# Patient Record
Sex: Female | Born: 1970 | Race: White | Hispanic: No | Marital: Married | Smoking: Former smoker
Health system: Southern US, Community
[De-identification: ages and names within clinical notes are randomized; demographics above are authoritative.]

## PROBLEM LIST (undated history)

## (undated) DIAGNOSIS — Z87442 Personal history of urinary calculi: Secondary | ICD-10-CM

## (undated) DIAGNOSIS — C4499 Other specified malignant neoplasm of skin, unspecified: Secondary | ICD-10-CM

## (undated) DIAGNOSIS — J189 Pneumonia, unspecified organism: Secondary | ICD-10-CM

## (undated) DIAGNOSIS — T8859XA Other complications of anesthesia, initial encounter: Secondary | ICD-10-CM

## (undated) DIAGNOSIS — I10 Essential (primary) hypertension: Secondary | ICD-10-CM

## (undated) DIAGNOSIS — F909 Attention-deficit hyperactivity disorder, unspecified type: Secondary | ICD-10-CM

## (undated) DIAGNOSIS — R569 Unspecified convulsions: Secondary | ICD-10-CM

## (undated) DIAGNOSIS — F32A Depression, unspecified: Secondary | ICD-10-CM

## (undated) DIAGNOSIS — T4145XA Adverse effect of unspecified anesthetic, initial encounter: Secondary | ICD-10-CM

## (undated) DIAGNOSIS — F329 Major depressive disorder, single episode, unspecified: Secondary | ICD-10-CM

## (undated) DIAGNOSIS — K8020 Calculus of gallbladder without cholecystitis without obstruction: Secondary | ICD-10-CM

## (undated) DIAGNOSIS — R011 Cardiac murmur, unspecified: Secondary | ICD-10-CM

## (undated) DIAGNOSIS — G43909 Migraine, unspecified, not intractable, without status migrainosus: Secondary | ICD-10-CM

## (undated) HISTORY — PX: WISDOM TOOTH EXTRACTION: SHX21

## (undated) HISTORY — PX: NECK SURGERY: SHX720

## (undated) HISTORY — DX: Migraine, unspecified, not intractable, without status migrainosus: G43.909

## (undated) HISTORY — PX: BACK SURGERY: SHX140

## (undated) HISTORY — DX: Unspecified convulsions: R56.9

## (undated) HISTORY — DX: Depression, unspecified: F32.A

## (undated) HISTORY — DX: Essential (primary) hypertension: I10

## (undated) HISTORY — DX: Calculus of gallbladder without cholecystitis without obstruction: K80.20

## (undated) HISTORY — DX: Major depressive disorder, single episode, unspecified: F32.9

## (undated) HISTORY — PX: OTHER SURGICAL HISTORY: SHX169

---

## 2000-07-19 HISTORY — PX: CHOLECYSTECTOMY: SHX55

## 2006-07-19 HISTORY — PX: CERVICAL ABLATION: SHX5771

## 2013-08-16 ENCOUNTER — Ambulatory Visit: Payer: Self-pay

## 2013-11-17 ENCOUNTER — Emergency Department: Payer: Self-pay | Admitting: Emergency Medicine

## 2013-11-17 LAB — CBC
HCT: 44 % (ref 35.0–47.0)
HGB: 15.1 g/dL (ref 12.0–16.0)
MCH: 29.3 pg (ref 26.0–34.0)
MCHC: 34.3 g/dL (ref 32.0–36.0)
MCV: 85 fL (ref 80–100)
Platelet: 189 10*3/uL (ref 150–440)
RBC: 5.15 10*6/uL (ref 3.80–5.20)
RDW: 13.6 % (ref 11.5–14.5)
WBC: 18.5 10*3/uL — ABNORMAL HIGH (ref 3.6–11.0)

## 2013-11-17 LAB — COMPREHENSIVE METABOLIC PANEL
Albumin: 4 g/dL (ref 3.4–5.0)
Alkaline Phosphatase: 99 U/L
Anion Gap: 11 (ref 7–16)
BUN: 10 mg/dL (ref 7–18)
Bilirubin,Total: 0.3 mg/dL (ref 0.2–1.0)
Calcium, Total: 9.3 mg/dL (ref 8.5–10.1)
Chloride: 107 mmol/L (ref 98–107)
Co2: 23 mmol/L (ref 21–32)
Creatinine: 0.54 mg/dL — ABNORMAL LOW (ref 0.60–1.30)
EGFR (African American): >60
Glucose: 107 mg/dL — ABNORMAL HIGH (ref 65–99)
OSMOLALITY: 281 (ref 275–301)
Potassium: 3.6 mmol/L (ref 3.5–5.1)
SGOT(AST): 25 U/L (ref 15–37)
SGPT (ALT): 24 U/L (ref 12–78)
SODIUM: 141 mmol/L (ref 136–145)
TOTAL PROTEIN: 7.9 g/dL (ref 6.4–8.2)

## 2013-11-17 LAB — TROPONIN I: Troponin-I: <0.02 ng/mL

## 2013-11-17 LAB — LIPASE, BLOOD: LIPASE: 150 U/L (ref 73–393)

## 2014-01-29 ENCOUNTER — Ambulatory Visit: Payer: Self-pay

## 2014-01-31 ENCOUNTER — Encounter: Payer: Self-pay | Admitting: Internal Medicine

## 2014-04-16 ENCOUNTER — Other Ambulatory Visit (INDEPENDENT_AMBULATORY_CARE_PROVIDER_SITE_OTHER): Payer: No Typology Code available for payment source

## 2014-04-16 ENCOUNTER — Other Ambulatory Visit: Payer: No Typology Code available for payment source

## 2014-04-16 ENCOUNTER — Ambulatory Visit (INDEPENDENT_AMBULATORY_CARE_PROVIDER_SITE_OTHER): Payer: No Typology Code available for payment source | Admitting: Internal Medicine

## 2014-04-16 ENCOUNTER — Encounter: Payer: Self-pay | Admitting: Internal Medicine

## 2014-04-16 VITALS — BP 126/80 | HR 72 | Ht 67.25 in | Wt 226.1 lb

## 2014-04-16 DIAGNOSIS — K5900 Constipation, unspecified: Secondary | ICD-10-CM

## 2014-04-16 LAB — CBC WITH DIFFERENTIAL/PLATELET
Basophils Absolute: 0.1 10*3/uL (ref 0.0–0.1)
Basophils Relative: 0.7 % (ref 0.0–3.0)
Eosinophils Absolute: 0.1 10*3/uL (ref 0.0–0.7)
Eosinophils Relative: 1.2 % (ref 0.0–5.0)
HCT: 42.5 % (ref 36.0–46.0)
Hemoglobin: 14.3 g/dL (ref 12.0–15.0)
Lymphocytes Relative: 25.8 % (ref 12.0–46.0)
Lymphs Abs: 3.2 10*3/uL (ref 0.7–4.0)
MCHC: 33.7 g/dL (ref 30.0–36.0)
MCV: 84.6 fl (ref 78.0–100.0)
Monocytes Absolute: 0.4 10*3/uL (ref 0.1–1.0)
Monocytes Relative: 3.3 % (ref 3.0–12.0)
Neutro Abs: 8.6 10*3/uL — ABNORMAL HIGH (ref 1.4–7.7)
Neutrophils Relative %: 69 % (ref 43.0–77.0)
Platelets: 237 10*3/uL (ref 150.0–400.0)
RBC: 5.02 Mil/uL (ref 3.87–5.11)
RDW: 13.6 % (ref 11.5–15.5)
WBC: 12.5 10*3/uL — ABNORMAL HIGH (ref 4.0–10.5)

## 2014-04-16 LAB — VITAMIN B12: Vitamin B-12: 390 pg/mL (ref 211–911)

## 2014-04-16 LAB — TSH: TSH: 1.06 u[IU]/mL (ref 0.35–4.50)

## 2014-04-16 MED ORDER — LINACLOTIDE 145 MCG PO CAPS: 145.0000 ug | ORAL_CAPSULE | Freq: Every day | ORAL | Status: DC

## 2014-04-16 NOTE — Patient Instructions (Signed)
Your physician has requested that you go to the basement for the following lab work before leaving today: TSH, CBC, B12  We have sent the following medications to your pharmacy for you to pick up at your convenience: Linzess 145 mcg daily  Please purchase the following medications over the counter and take as directed: Benefiber 1 tablespoon daily   High-Fiber Diet Fiber is found in fruits, vegetables, and grains. A high-fiber diet encourages the addition of more whole grains, legumes, fruits, and vegetables in your diet. The recommended amount of fiber for adult males is 38 g per day. For adult females, it is 25 g per day. Pregnant and lactating women should get 28 g of fiber per day. If you have a digestive or bowel problem, ask your caregiver for advice before adding high-fiber foods to your diet. Eat a variety of high-fiber foods instead of only a select few type of foods.  PURPOSE  To increase stool bulk.  To make bowel movements more regular to prevent constipation.  To lower cholesterol.  To prevent overeating. WHEN IS THIS DIET USED?  It may be used if you have constipation and hemorrhoids.  It may be used if you have uncomplicated diverticulosis (intestine condition) and irritable bowel syndrome.  It may be used if you need help with weight management.  It may be used if you want to add it to your diet as a protective measure against atherosclerosis, diabetes, and cancer. SOURCES OF FIBER  Whole-grain breads and cereals.  Fruits, such as apples, oranges, bananas, berries, prunes, and pears.  Vegetables, such as green peas, carrots, sweet potatoes, beets, broccoli, cabbage, spinach, and artichokes.  Legumes, such split peas, soy, lentils.  Almonds. FIBER CONTENT IN FOODS Starches and Grains / Dietary Fiber (g)  Cheerios, 1 cup / 3 g  Corn Flakes cereal, 1 cup / 0.7 g  Rice crispy treat cereal, 1 cup / 0.3 g  Instant oatmeal (cooked),  cup / 2 g  Frosted  wheat cereal, 1 cup / 5.1 g  Brown, long-grain rice (cooked), 1 cup / 3.5 g  White, long-grain rice (cooked), 1 cup / 0.6 g  Enriched macaroni (cooked), 1 cup / 2.5 g Legumes / Dietary Fiber (g)  Baked beans (canned, plain, or vegetarian),  cup / 5.2 g  Kidney beans (canned),  cup / 6.8 g  Pinto beans (cooked),  cup / 5.5 g Breads and Crackers / Dietary Fiber (g)  Plain or honey graham crackers, 2 squares / 0.7 g  Saltine crackers, 3 squares / 0.3 g  Plain, salted pretzels, 10 pieces / 1.8 g  Whole-wheat bread, 1 slice / 1.9 g  White bread, 1 slice / 0.7 g  Raisin bread, 1 slice / 1.2 g  Plain bagel, 3 oz / 2 g  Flour tortilla, 1 oz / 0.9 g  Corn tortilla, 1 small / 1.5 g  Hamburger or hotdog bun, 1 small / 0.9 g Fruits / Dietary Fiber (g)  Apple with skin, 1 medium / 4.4 g  Sweetened applesauce,  cup / 1.5 g  Banana,  medium / 1.5 g  Grapes, 10 grapes / 0.4 g  Orange, 1 small / 2.3 g  Raisin, 1.5 oz / 1.6 g  Melon, 1 cup / 1.4 g Vegetables / Dietary Fiber (g)  Green beans (canned),  cup / 1.3 g  Carrots (cooked),  cup / 2.3 g  Broccoli (cooked),  cup / 2.8 g  Peas (cooked),  cup / 4.4  g  Mashed potatoes,  cup / 1.6 g  Lettuce, 1 cup / 0.5 g  Corn (canned),  cup / 1.6 g  Tomato,  cup / 1.1 g Document Released: 07/05/2005 Document Revised: 01/04/2012 Document Reviewed: 10/07/2011 [facility] Patient Information 2015 [place], [place]. This information is not intended to replace advice given to you by your health care provider. Make sure you discuss any questions you have with your health care provider.

## 2014-04-16 NOTE — Progress Notes (Signed)
Teresa Rhodes [birth year] [id number]  Note: This dictation was prepared with Dragon digital system. Any transcriptional errors that result from this procedure are unintentional.   History of Present Illness:  This is a 43 year old white female who moved from [place] recently. She now  Has a sedentary  office job and she is complaining of constipation as well as difficult evacuation. She has a bowel movement every 1-4 days. She has difficulty pushing the stool out, has to apply manual pressure.. She had one vaginal delivery 19 years ago. She denies rectal bleeding. Her diet consists of low fiber foods. Her mother has ulcerative colitis and 2 cousins had Crohn's disease. There is no family history of colon cancer.    Past Medical History  Diagnosis Date  . Migraine   . Gallstones   . Seizures 2010-2013    Brain seizures    Past Surgical History  Procedure Laterality Date  . Cholecystectomy  2002  . Neck surgery  2005 & 2009    x2    No Known Allergies  Family history and social history have been reviewed.  Review of Systems:   The remainder of the 10 point ROS is negative except as outlined in the H&P  Physical Exam: General Appearance Well developed, in no distress Eyes  Non icteric  HEENT  Non traumatic, normocephalic  Mouth No lesion, tongue papillated, no cheilosis Neck Supple without adenopathy, thyroid not enlarged, no carotid bruits, no JVD Lungs Clear to auscultation bilaterally COR Normal S1, normal S2, regular rhythm, no murmur, quiet precordium Abdomen soft, obese. Mildly tender in left lower quadrant. No distention. Normal active bowel sounds Rectal normal rectal sphincter tone. Soft Hemoccult negative stool Extremities  No pedal edema Skin No lesions Neurological Alert and oriented x 3 Psychological Normal mood and affect  Assessment and Plan:   Problem #30 43 year old white female with constipation likely due to lack of physical activity.and poor  dietary choices. She may have an element of pelvic relaxation. She will start on Benefiber one tablespoon daily added to her daily Smoothie, Linzess 145 mcg daily, and a high-fiber diet. She will increase her physical activity. We will be checking her TSH, CBC and B12 today and she should return in 3-6 months if no improvement.    Delfin Edis 04/16/2014

## 2014-05-06 ENCOUNTER — Telehealth: Payer: Self-pay | Admitting: *Deleted

## 2014-05-06 NOTE — Telephone Encounter (Signed)
Dr Olevia Perches, patient's insurance has denied coverage for Linzess. Patient must have tried and failed Lactulose or Miralax AND Amitiza prior to approval for Linzess. Please advise.Marland KitchenMarland KitchenMarland Kitchen

## 2014-05-06 NOTE — Telephone Encounter (Signed)
Start with Miralax 9 gm, 3x/week,  525 gm 3 refills, Call back if no improvement, she may use Miralax every day if necessary. She may also increase the dose from 1/2 capful to a whole capful if she does not have a BM in 2 days.

## 2014-05-07 NOTE — Telephone Encounter (Signed)
Left message for patient to call back  

## 2014-05-09 NOTE — Telephone Encounter (Signed)
Left message for patient to call back  

## 2014-05-13 MED ORDER — POLYETHYLENE GLYCOL 3350 17 GM/SCOOP PO POWD: ORAL | Status: DC

## 2014-05-13 NOTE — Telephone Encounter (Signed)
Left message for patient to call back  

## 2014-05-13 NOTE — Telephone Encounter (Signed)
Patient advised of Dr Nichola Sizer recommendations and she verbalizes understanding. I have sent miralax rx to pharmacy.

## 2014-06-04 ENCOUNTER — Ambulatory Visit (INDEPENDENT_AMBULATORY_CARE_PROVIDER_SITE_OTHER): Payer: No Typology Code available for payment source

## 2014-06-04 ENCOUNTER — Encounter: Payer: Self-pay | Admitting: Podiatry

## 2014-06-04 ENCOUNTER — Ambulatory Visit (INDEPENDENT_AMBULATORY_CARE_PROVIDER_SITE_OTHER): Payer: No Typology Code available for payment source | Admitting: Podiatry

## 2014-06-04 VITALS — BP 143/90 | HR 79 | Resp 16 | Ht 69.0 in | Wt 220.0 lb

## 2014-06-04 DIAGNOSIS — M722 Plantar fascial fibromatosis: Secondary | ICD-10-CM

## 2014-06-04 NOTE — Progress Notes (Signed)
   Subjective:    Patient ID: Teresa Rhodes, female    DOB: 02-28-1971, 43 y.o.   MRN: [id number]  HPI Comments: 43 year old female presents the office with complaints of right foot pain. She states this has been ongoing for approximate one year and has been progressive. She states that her entire life she had a flatfoot that over the past year she has noticed an increase in arch height. She states that she has pain in her heels particularly after sitting when the mornings. After periods time of ambulation she has resolution of symptoms. She does state that pain is intermittent and is not on a consistent basis. She has been taking over-the-counter Aleve as well as using inserts in her shoes without much resolution of symptoms. She has a states that her right foot "does not want to work". She states that with ambulation she trips/falls due to the inability to bring her foot up. This again his been ongoing for approximately one year.  The patient also states that she Has been having right upper extremity weakness which has been again ongoing for greater than 1 year. She does that she has a history of neck surgery. Upon questioning about any family neurological disorders she does state that her brother and her father are deceased and had Charcot-Marie-Tooth. She has previously seen a neurologist due to seizures in the past but not for this.  Foot Pain      Review of Systems  Musculoskeletal:       Swelling  Muscle pain   Hematological: Bruises/bleeds easily.  All other systems reviewed and are negative.      Objective:   Physical Exam Objective: AAO x3, NAD DP/PT pulses palpable bilaterally, CRT less than 3 seconds Protective sensation intact with Simms Weinstein monofilament, vibratory sensation intact, Achilles tendon reflex intact Tenderness palpation of the plantar medial tubercle of the right calcaneus at the insertion the plantar fascia as well as just distal to the insertion into  the calcaneus. There is no pain with lateral compression of the calcaneus or pain with vibratory sensation. There is no overlying edema, erythema, increase in warmth. There is no pain on the posterior aspect of the calcaneus or along the course or the insertion of the Achilles tendon. There appears to be a more rectus foot type on the right and a significant flatfoot deformity on the left. There does appear to be somewhat of a foot drop on the right lower extremity during gait. Decreased dorsiflexion/plantarflexion on the right compared to the left.  No pain with calf compression, swelling, warmth, erythema No open lesions. There are no acute signs of stroke or any acute neurological disorder occurring.       Assessment & Plan:  43 year old female with right foot pain, likely plantar fasciitis -X-rays were obtained and reviewed with the patient. -Conservative versus surgical treatment discussed including alternatives, risks, competitions. -At this time due to the right upper and lower extremity weakness and history of CMT, drop foot patient needs to be evaluated by neurology. A referral was placed they for [facility] neurology. -Discussed future treatment options for heel pain/plantar fasciitis. At this time we'll proceed with stretching, icing, shoe gear modifications. Also discussed other various treatment options however the patient wishes to hold off on these at this time. -Follow-up in 3 weeks or after neurology evaluation. In the meantime, call the office if any questions, concerns, change in symptoms.

## 2014-06-04 NOTE — Patient Instructions (Signed)
Plantar Fasciitis (Heel Spur Syndrome) with Rehab The plantar fascia is a fibrous, ligament-like, soft-tissue structure that spans the bottom of the foot. Plantar fasciitis is a condition that causes pain in the foot due to inflammation of the tissue. SYMPTOMS   Pain and tenderness on the underneath side of the foot.  Pain that worsens with standing or walking. CAUSES  Plantar fasciitis is caused by irritation and injury to the plantar fascia on the underneath side of the foot. Common mechanisms of injury include:  Direct trauma to bottom of the foot.  Damage to a small nerve that runs under the foot where the main fascia attaches to the heel bone.  Stress placed on the plantar fascia due to bone spurs. RISK INCREASES WITH:   Activities that place stress on the plantar fascia (running, jumping, pivoting, or cutting).  Poor strength and flexibility.  Improperly fitted shoes.  Tight calf muscles.  Flat feet.  Failure to warm-up properly before activity.  Obesity. PREVENTION  Warm up and stretch properly before activity.  Allow for adequate recovery between workouts.  Maintain physical fitness:  Strength, flexibility, and endurance.  Cardiovascular fitness.  Maintain a health body weight.  Avoid stress on the plantar fascia.  Wear properly fitted shoes, including arch supports for individuals who have flat feet. PROGNOSIS  If treated properly, then the symptoms of plantar fasciitis usually resolve without surgery. However, occasionally surgery is necessary. RELATED COMPLICATIONS   Recurrent symptoms that may result in a chronic condition.  Problems of the lower back that are caused by compensating for the injury, such as limping.  Pain or weakness of the foot during push-off following surgery.  Chronic inflammation, scarring, and partial or complete fascia tear, occurring more often from repeated injections. TREATMENT  Treatment initially involves the use of  ice and medication to help reduce pain and inflammation. The use of strengthening and stretching exercises may help reduce pain with activity, especially stretches of the Achilles tendon. These exercises may be performed at home or with a therapist. Your caregiver may recommend that you use heel cups of arch supports to help reduce stress on the plantar fascia. Occasionally, corticosteroid injections are given to reduce inflammation. If symptoms persist for greater than 6 months despite non-surgical (conservative), then surgery may be recommended.  MEDICATION   If pain medication is necessary, then nonsteroidal anti-inflammatory medications, such as aspirin and ibuprofen, or other minor pain relievers, such as acetaminophen, are often recommended.  Do not take pain medication within 7 days before surgery.  Prescription pain relievers may be given if deemed necessary by your caregiver. Use only as directed and only as much as you need.  Corticosteroid injections may be given by your caregiver. These injections should be reserved for the most serious cases, because they may only be given a certain number of times. HEAT AND COLD  Cold treatment (icing) relieves pain and reduces inflammation. Cold treatment should be applied for 10 to 15 minutes every 2 to 3 hours for inflammation and pain and immediately after any activity that aggravates your symptoms. Use ice packs or massage the area with a piece of ice (ice massage).  Heat treatment may be used prior to performing the stretching and strengthening activities prescribed by your caregiver, physical therapist, or athletic trainer. Use a heat pack or soak the injury in warm water. SEEK IMMEDIATE MEDICAL CARE IF:  Treatment seems to offer no benefit, or the condition worsens.  Any medications produce adverse side effects. EXERCISES RANGE   OF MOTION (ROM) AND STRETCHING EXERCISES - Plantar Fasciitis (Heel Spur Syndrome) These exercises may help you  when beginning to rehabilitate your injury. Your symptoms may resolve with or without further involvement from your physician, physical therapist or athletic trainer. While completing these exercises, remember:   Restoring tissue flexibility helps normal motion to return to the joints. This allows healthier, less painful movement and activity.  An effective stretch should be held for at least 30 seconds.  A stretch should never be painful. You should only feel a gentle lengthening or release in the stretched tissue. RANGE OF MOTION - Toe Extension, Flexion  Sit with your right / left leg crossed over your opposite knee.  Grasp your toes and gently pull them back toward the top of your foot. You should feel a stretch on the bottom of your toes and/or foot.  Hold this stretch for __________ seconds.  Now, gently pull your toes toward the bottom of your foot. You should feel a stretch on the top of your toes and or foot.  Hold this stretch for __________ seconds. Repeat __________ times. Complete this stretch __________ times per day.  RANGE OF MOTION - Ankle Dorsiflexion, Active Assisted  Remove shoes and sit on a chair that is preferably not on a carpeted surface.  Place right / left foot under knee. Extend your opposite leg for support.  Keeping your heel down, slide your right / left foot back toward the chair until you feel a stretch at your ankle or calf. If you do not feel a stretch, slide your bottom forward to the edge of the chair, while still keeping your heel down.  Hold this stretch for __________ seconds. Repeat __________ times. Complete this stretch __________ times per day.  STRETCH - Gastroc, Standing  Place hands on wall.  Extend right / left leg, keeping the front knee somewhat bent.  Slightly point your toes inward on your back foot.  Keeping your right / left heel on the floor and your knee straight, shift your weight toward the wall, not allowing your back to  arch.  You should feel a gentle stretch in the right / left calf. Hold this position for __________ seconds. Repeat __________ times. Complete this stretch __________ times per day. STRETCH - Soleus, Standing  Place hands on wall.  Extend right / left leg, keeping the other knee somewhat bent.  Slightly point your toes inward on your back foot.  Keep your right / left heel on the floor, bend your back knee, and slightly shift your weight over the back leg so that you feel a gentle stretch deep in your back calf.  Hold this position for __________ seconds. Repeat __________ times. Complete this stretch __________ times per day. STRETCH - Gastrocsoleus, Standing  Note: This exercise can place a lot of stress on your foot and ankle. Please complete this exercise only if specifically instructed by your caregiver.   Place the ball of your right / left foot on a step, keeping your other foot firmly on the same step.  Hold on to the wall or a rail for balance.  Slowly lift your other foot, allowing your body weight to press your heel down over the edge of the step.  You should feel a stretch in your right / left calf.  Hold this position for __________ seconds.  Repeat this exercise with a slight bend in your right / left knee. Repeat __________ times. Complete this stretch __________ times per day.    STRENGTHENING EXERCISES - Plantar Fasciitis (Heel Spur Syndrome)  These exercises may help you when beginning to rehabilitate your injury. They may resolve your symptoms with or without further involvement from your physician, physical therapist or athletic trainer. While completing these exercises, remember:   Muscles can gain both the endurance and the strength needed for everyday activities through controlled exercises.  Complete these exercises as instructed by your physician, physical therapist or athletic trainer. Progress the resistance and repetitions only as guided. STRENGTH -  Towel Curls  Sit in a chair positioned on a non-carpeted surface.  Place your foot on a towel, keeping your heel on the floor.  Pull the towel toward your heel by only curling your toes. Keep your heel on the floor.  If instructed by your physician, physical therapist or athletic trainer, add ____________________ at the end of the towel. Repeat __________ times. Complete this exercise __________ times per day. STRENGTH - Ankle Inversion  Secure one end of a rubber exercise band/tubing to a fixed object (table, pole). Loop the other end around your foot just before your toes.  Place your fists between your knees. This will focus your strengthening at your ankle.  Slowly, pull your big toe up and in, making sure the band/tubing is positioned to resist the entire motion.  Hold this position for __________ seconds.  Have your muscles resist the band/tubing as it slowly pulls your foot back to the starting position. Repeat __________ times. Complete this exercises __________ times per day.  Document Released: 07/05/2005 Document Revised: 09/27/2011 Document Reviewed: 10/17/2008 [employer] Patient Information 2015 [employer]. This information is not intended to replace advice given to you by your health care provider. Make sure you discuss any questions you have with your health care provider.  

## 2014-06-05 ENCOUNTER — Telehealth: Payer: Self-pay | Admitting: *Deleted

## 2014-06-05 NOTE — Telephone Encounter (Signed)
Called and left message letting pt know all of her info was sent to [facility] and waiting to hear back from them. When i hear back from them i will call and let her know.

## 2014-06-07 ENCOUNTER — Ambulatory Visit: Payer: Self-pay | Admitting: Podiatry

## 2014-06-07 ENCOUNTER — Telehealth: Payer: Self-pay | Admitting: *Deleted

## 2014-06-07 NOTE — Telephone Encounter (Signed)
Called and left message letting pt know her appt with [facility] is sch 11.25.15 at 1:00

## 2014-06-11 ENCOUNTER — Ambulatory Visit: Payer: Self-pay | Admitting: Podiatry

## 2014-06-12 ENCOUNTER — Ambulatory Visit (INDEPENDENT_AMBULATORY_CARE_PROVIDER_SITE_OTHER): Payer: No Typology Code available for payment source | Admitting: Neurology

## 2014-06-12 ENCOUNTER — Encounter: Payer: Self-pay | Admitting: Neurology

## 2014-06-12 VITALS — BP 146/87 | HR 83 | Temp 97.6°F | Ht 69.0 in | Wt 226.0 lb

## 2014-06-12 DIAGNOSIS — R9082 White matter disease, unspecified: Secondary | ICD-10-CM | POA: Insufficient documentation

## 2014-06-12 DIAGNOSIS — R5382 Chronic fatigue, unspecified: Secondary | ICD-10-CM

## 2014-06-12 DIAGNOSIS — M21371 Foot drop, right foot: Secondary | ICD-10-CM

## 2014-06-12 DIAGNOSIS — M21379 Foot drop, unspecified foot: Secondary | ICD-10-CM | POA: Insufficient documentation

## 2014-06-12 DIAGNOSIS — G4719 Other hypersomnia: Secondary | ICD-10-CM

## 2014-06-12 DIAGNOSIS — R930 Abnormal findings on diagnostic imaging of skull and head, not elsewhere classified: Secondary | ICD-10-CM

## 2014-06-12 DIAGNOSIS — R29898 Other symptoms and signs involving the musculoskeletal system: Secondary | ICD-10-CM

## 2014-06-12 DIAGNOSIS — G35 Multiple sclerosis: Secondary | ICD-10-CM

## 2014-06-12 DIAGNOSIS — G600 Hereditary motor and sensory neuropathy: Secondary | ICD-10-CM

## 2014-06-12 NOTE — Patient Instructions (Signed)
Overall you are doing fairly well but I do want to suggest a few things today:   Remember to drink plenty of fluid, eat healthy meals and do not skip any meals. Try to eat protein with a every meal and eat a healthy snack such as fruit or nuts in between meals. Try to keep a regular sleep-wake schedule and try to exercise daily, particularly in the form of walking, 20-30 minutes a day, if you can.   As far as diagnostic testing: MRI of the brain and cervical spine, EMG/NCS, labwork  I would like to see you back in 3 months, sooner if we need to. Please call us with any interim questions, concerns, problems, updates or refill requests.   Please also call us for any test results so we can go over those with you on the phone.  My clinical assistant and will answer any of your questions and relay your messages to me and also relay most of my messages to you.   Our phone number is [phone]. We also have an after hours call service for urgent matters and there is a physician on-call for urgent questions. For any emergencies you know to call 911 or go to the nearest emergency room

## 2014-06-12 NOTE — Progress Notes (Addendum)
[facility]    Provider:  Dr Jaynee Eagles Referring Provider: Celesta Gentile, [facility] Primary Care Physician:  No primary care provider on file.  CC:  Right foot drop, right arm weakness  HPI:  Teresa Rhodes is a 43 y.o. female here as a referral from Dr. Jacqualyn Posey for right arm weakness, foot drop, FHx CMT.  43 year old female presents the office with complaints of right foot pain. She states this has been ongoing for approximate one year and has been progressive. She states that her entire life she had a flat feet. She states that she has pain in her heels particularly after sitting. She does state that pain is intermittent and is not on a consistent basis. She has been taking over-the-counter Aleve as well as using inserts in her shoes without much resolution of symptoms.  Has weakness in the right foot, sometimes she trips over the foot. The foot doesn't drag. Tingling in both feet. Never had good balance. She played baseball when younger, rode bikes, no weakness as an adolescent. Weakness started around 37 after neck surgeries. Weakness in the right arm over the last year. The right hand "doesn't work very well" sometimes if she overuses it. Father and brother had CMT. Never been evaluated or genetically tested. Left arm and hand, left leg unaffected.   Denies frequent sprained ankles or difficulty running and keeping up with peers as a child. Has always been a little clumsy walking. She snores, very tired the during.   She has a PMHx of possible seizures vs MS and she was worked up in 2010-2012, "white spots were on the brain".   Reviewed notes, labs and imaging from outside physicians, which showed: patient was seen at the [facility] for right foot pain and was diagnosed with plantar fasciitis. She was referred to neurology for right arm weakness and FHx of CMT.   Review of Systems: Patient complains of symptoms per HPI as well as the following symptoms fatigue,eye  pain, easy bruising, joint pain, joint swelling, aching muscles, memory loss, headache, numbness, weakness, racing thoughts, not enough sleep. Pertinent negatives per HPI. All others negative.   History   Social History  . Marital Status: Married    Spouse Name: N/A    Number of Children: 1  . Years of Education: N/A   Occupational History  . Civil engineer, contracting    Social History Main Topics  . Smoking status: Current Every Day Smoker -- 0.50 packs/day for 3 years    Types: Cigarettes  . Smokeless tobacco: Never Used     Comment: pt given handout  . Alcohol Use: No  . Drug Use: No  . Sexual Activity: Not on file   Other Topics Concern  . Not on file   Social History Narrative    Family History  Problem Relation Age of Onset  . Ovarian cancer Mother   . Ulcerative colitis Mother   . Liver disease Maternal Grandfather   . Kidney disease Father 14  . Kidney disease Brother 33  . Colon cancer Neg Hx   . Colon polyps Neg Hx   . Esophageal cancer Neg Hx     Past Medical History  Diagnosis Date  . Migraine   . Gallstones   . Seizures 2010-2013    Brain seizures    Past Surgical History  Procedure Laterality Date  . Cholecystectomy  2002  . Neck surgery  2005 & 2009    x2    No current outpatient  prescriptions on file.   No current facility-administered medications for this visit.    Allergies as of 06/12/2014  . (No Known Allergies)    Vitals: BP 146/87 mmHg  Pulse 83  Temp(Src) 97.6 F (36.4 C) (Oral)  Ht 5\' 9"  (1.753 m)  Wt 226 lb (102.513 kg)  BMI 33.36 kg/m2 Last Weight:  Wt Readings from Last 1 Encounters:  06/12/14 226 lb (102.513 kg)   Last Height:   Ht Readings from Last 1 Encounters:  06/12/14 5\' 9"  (1.753 m)    Physical exam: Exam: Gen: NAD, conversant, well nourised, well groomed                     CV: RRR, no MRG. No Carotid Bruits. No peripheral edema, warm, nontender Eyes: Conjunctivae clear without exudates or  hemorrhage  Neuro: Detailed Neurologic Exam  Speech:    Speech is normal; fluent and spontaneous with normal comprehension.  Cognition:    The patient is oriented to person, place, and time;     recent and remote memory intact;     language fluent;     normal attention, concentration,     fund of knowledge Cranial Nerves:    The pupils are equal, round, and reactive to light. The fundi are normal and spontaneous venous pulsations are present. Visual fields are full to finger confrontation. Extraocular movements are intact. Trigeminal sensation is intact and the muscles of mastication are normal. The face is symmetric. The palate elevates in the midline. Voice is normal. Shoulder shrug is normal. The tongue has normal motion without fasciculations.   Coordination:    Normal finger to nose and heel to shin. Normal rapid alternating movements.   Gait:    Difficulty with right heel walk.   Motor Observation:    Mild distal tapering, high arch on right foot, flatter on left foot. No hammer toes. Tone:    Normal muscle tone.    Posture:    Posture is normal. normal erect    Strength:    Right foot 3+/5. Otherwise strength is V/V in the upper and lower limbs.      Sensation:     Intact  Reflex Exam:  DTR's:    Deep tendon reflexes in the upper and lower extremities are normal bilaterally.   Toes:    The toes are downgoing bilaterally.   Clonus:    Clonus is absent.      Assessment/Plan:  43 year old female here for evaluation or right foot drop and FHx of CMT. She has mildly tapering distal legs, weakness of right DF, high right arch and flat left foot. Sensation and reflexes intact. She also has reported right arm weakness and clumsiness and a history of cervical spine surgery, seizures and possible MS due to "white spots on the brain" in the past.   EMG/NCS of both legs and right arm to evaluate foot drop, right arm clumsiness and eval for CMT (FHx) MRI of the brain and  neck to evaluate for previous reports of seizures and possible MS ("whote spots on the brain") with right-sided weakness/clumsiness. Need previous records, will request.  Encouraged smoking cessation Labs today Patient reports excessive daytime sleepiness and snoring. Given increased risk of OSA associated with Charcot-Marie-Tooth, suggest a sleep study.    Sarina Ill, MD  [facility] [address] [place], [place] [address]  Phone [phone] Fax [phone]

## 2014-06-13 LAB — COMPREHENSIVE METABOLIC PANEL
ALT: 10 IU/L (ref 0–32)
AST: 11 IU/L (ref 0–40)
Albumin/Globulin Ratio: 1.7 (ref 1.1–2.5)
Albumin: 4.6 g/dL (ref 3.5–5.5)
Alkaline Phosphatase: 107 IU/L (ref 39–117)
BILIRUBIN TOTAL: 0.4 mg/dL (ref 0.0–1.2)
BUN/Creatinine Ratio: 9 (ref 9–23)
BUN: 5 mg/dL — ABNORMAL LOW (ref 6–24)
CHLORIDE: 100 mmol/L (ref 97–108)
CO2: 25 mmol/L (ref 18–29)
CREATININE: 0.55 mg/dL — AB (ref 0.57–1.00)
Calcium: 9.7 mg/dL (ref 8.7–10.2)
GFR calc Af Amer: 135 mL/min/{1.73_m2} (ref >59)
GFR calc non Af Amer: 117 mL/min/{1.73_m2} (ref >59)
GLOBULIN, TOTAL: 2.7 g/dL (ref 1.5–4.5)
GLUCOSE: 96 mg/dL (ref 65–99)
Potassium: 4.3 mmol/L (ref 3.5–5.2)
Sodium: 141 mmol/L (ref 134–144)
TOTAL PROTEIN: 7.3 g/dL (ref 6.0–8.5)

## 2014-06-14 ENCOUNTER — Encounter: Payer: Self-pay | Admitting: Neurology

## 2014-06-14 NOTE — Addendum Note (Signed)
Addended by: Sarina Ill B on: 06/14/2014 01:06 PM   Modules accepted: Orders

## 2014-06-18 ENCOUNTER — Telehealth: Payer: Self-pay | Admitting: *Deleted

## 2014-06-18 NOTE — Telephone Encounter (Signed)
-----   Message from Melvenia Beam, MD sent at 06/17/2014  1:33 PM EST ----- Please let patient know that their labs were normal. Thank you.

## 2014-06-18 NOTE — Telephone Encounter (Signed)
Called patient and left message with normal labs and to call the office back with any questions or concerns.

## 2014-06-25 ENCOUNTER — Ambulatory Visit: Payer: No Typology Code available for payment source | Admitting: Podiatry

## 2014-06-26 ENCOUNTER — Ambulatory Visit (INDEPENDENT_AMBULATORY_CARE_PROVIDER_SITE_OTHER): Payer: No Typology Code available for payment source | Admitting: Neurology

## 2014-06-26 ENCOUNTER — Ambulatory Visit (INDEPENDENT_AMBULATORY_CARE_PROVIDER_SITE_OTHER): Payer: No Typology Code available for payment source

## 2014-06-26 DIAGNOSIS — R29898 Other symptoms and signs involving the musculoskeletal system: Secondary | ICD-10-CM

## 2014-06-26 DIAGNOSIS — G35 Multiple sclerosis: Secondary | ICD-10-CM

## 2014-06-26 DIAGNOSIS — M21371 Foot drop, right foot: Secondary | ICD-10-CM

## 2014-06-26 DIAGNOSIS — G609 Hereditary and idiopathic neuropathy, unspecified: Secondary | ICD-10-CM

## 2014-06-26 DIAGNOSIS — R930 Abnormal findings on diagnostic imaging of skull and head, not elsewhere classified: Secondary | ICD-10-CM

## 2014-06-26 DIAGNOSIS — Z0289 Encounter for other administrative examinations: Secondary | ICD-10-CM

## 2014-06-26 DIAGNOSIS — R9082 White matter disease, unspecified: Secondary | ICD-10-CM

## 2014-06-26 NOTE — Progress Notes (Signed)
[facility]    Provider:  Dr Jaynee Eagles Referring Provider: No ref. provider found Primary Care Physician:  No primary care provider on file.   HPI: Teresa Rhodes is a 43 y.o. female here as a referral from Dr. Jacqualyn Posey for right arm weakness, foot drop, FHx CMT.  43 year old female presents the office with complaints of right foot pain. She is here for evaluation of right foot drop and FHx of CMT. She has a PMHx of possible seizures vs MS and she was worked up in 2010-2012  for "white spots were on the brain". She states foot drop has been ongoing for approximately one year and has been progressive. Has some LBP and radicular symptoms. She says that her entire life she had  flat feet. She has pain in her heels particularly after sitting. She does state that pain is intermittent and is not on a consistent basis. She has been taking over-the-counter Aleve as well as using inserts in her shoes without much resolution of symptoms. Has weakness in the right foot, sometimes she trips over the foot. Tingling in both feet. Never had good balance. She played baseball when younger, rode bikes, no weakness as an adolescent. Weakness started around 37 after neck surgeries. Weakness in the right arm over the last year. The right hand "doesn't work very well" sometimes if she overuses it. Father and brother had CMT. Never been evaluated or genetically tested. Left arm and hand, left leg unaffected. Denies frequent sprained ankles or difficulty running and keeping up with peers as a child. Has always been a little clumsy walking. She snores, very tired in the day. On exam, she has mildly tapering distal legs, weakness of right DF, high right arch and flat left foot. Sensation and reflexes intact.  Summary  Nerve conduction studies were performed on the left upper and bilateral lower extremities:  The right Median motor nerve showed normal conductions with normal F Wave latency The right Ulnar  motor nerve showed normal conductions with normal F Wave latency Bilateral  Peroneal motor nerves showed normal conductions with normal F Wave latency Bilateral  Tibial motor nerves showed normal conductions with normal F Wave latency The right second-digit Median sensory nerve conduction was within normal limits The right fifth-digit Ulnar sensory nerve conduction was within normal limits Bilateral  Sural sensory nerve conductions were within normal limits Bilateral H Reflexes showed normal latencies  EMG Needle study was performed on selected right lower extremity muscles:   The Anterior Tibialis showed reduced insertional activity,  increased motor unit amplitude, reduced motor unit recruitment, prolonged motor unit duration and increased polyphasic potentials. The Peroneus Longus showed reduced insertional activity,  increased motor unit amplitude, reduced motor unit recruitment and prolonged motor unit duration. The Extensor Hallucis showed increased motor unit amplitude, reduced motor unit recruitment, prolonged motor unit duration and increased polyphasic potentials. The Posterior Tibialis and Biceps Femoris(short head) showed  increased motor unit amplitude, reduced motor unit recruitment and prolonged motor unit duration   The Vastus Medialis,  Medial Gastrocnemius  muscles were within normal limits. The Gluteus Maximus, Gluteus Medius, L4/L5/S1 paraspinal muscles were also within normal limits but were technically difficult to assess due to body habitus  Conclusion: There are chronic neurogenic changes in muscles sharing L5 innervation consistent with an L5 radiculopathy. Recommend MRI lumbar spine.  The normal conductions do not support peroneal compression at the fibular head, demyelinating or axonal polyneuropathy or sciatic neuropathy. However, given the scant EMG changes from the  back and more proximal muscles as compared to the rest of the limb muscles, would also consider pelvic  MRI as clinically warranted to rule out any problem involving the plexus.    Sarina Ill, MD  [facility] [address] [place], [place] [address]  Phone [phone] Fax [phone]

## 2014-06-27 MED ORDER — GADOPENTETATE DIMEGLUMINE 469.01 MG/ML IV SOLN: 20.0000 mL | Freq: Once | INTRAVENOUS | Status: AC | PRN

## 2014-06-30 NOTE — Progress Notes (Signed)
[facility]    Provider:  Dr Jaynee Eagles Referring Provider: No ref. provider found Primary Care Physician:  No primary care provider on file.   HPI: Teresa Rhodes is a 43 y.o. female here as a referral from Dr. Jacqualyn Posey for right arm weakness, foot drop, FHx CMT.  43 year old female presents the office with complaints of right foot pain. She is here for evaluation of right foot drop and FHx of CMT. She has a PMHx of possible seizures vs MS and she was worked up in 2010-2012  for "white spots were on the brain". She states foot drop has been ongoing for approximately one year and has been progressive. Has some LBP and radicular symptoms. She says that her entire life she had  flat feet. She has pain in her heels particularly after sitting. She does state that pain is intermittent and is not on a consistent basis. She has been taking over-the-counter Aleve as well as using inserts in her shoes without much resolution of symptoms. Has weakness in the right foot, sometimes she trips over the foot. Tingling in both feet. Never had good balance. She played baseball when younger, rode bikes, no weakness as an adolescent. Weakness started around 37 after neck surgeries. Weakness in the right arm over the last year. The right hand "doesn't work very well" sometimes if she overuses it. Father and brother had CMT. Never been evaluated or genetically tested. Left arm and hand, left leg unaffected. Denies frequent sprained ankles or difficulty running and keeping up with peers as a child. Has always been a little clumsy walking. She snores, very tired in the day. On exam, she has mildly tapering distal legs, weakness of right DF, high right arch and flat left foot. Sensation and reflexes intact.  Summary  Nerve conduction studies were performed on the left upper and bilateral lower extremities:  The right Median motor nerve showed normal conductions with normal F Wave latency The right Ulnar  motor nerve showed normal conductions with normal F Wave latency Bilateral  Peroneal motor nerves showed normal conductions with normal F Wave latency Bilateral  Tibial motor nerves showed normal conductions with normal F Wave latency The right second-digit Median sensory nerve conduction was within normal limits The right fifth-digit Ulnar sensory nerve conduction was within normal limits Bilateral  Sural sensory nerve conductions were within normal limits Bilateral H Reflexes showed normal latencies  EMG Needle study was performed on selected right lower extremity muscles:   The Anterior Tibialis showed reduced insertional activity,  increased motor unit amplitude, reduced motor unit recruitment, prolonged motor unit duration and increased polyphasic potentials. The Peroneus Longus showed reduced insertional activity,  increased motor unit amplitude, reduced motor unit recruitment and prolonged motor unit duration. The Extensor Hallucis showed increased motor unit amplitude, reduced motor unit recruitment, prolonged motor unit duration and increased polyphasic potentials. The Posterior Tibialis and Biceps Femoris(short head) showed  increased motor unit amplitude, reduced motor unit recruitment and prolonged motor unit duration   The Vastus Medialis,  Medial Gastrocnemius  muscles were within normal limits. The Gluteus Maximus, Gluteus Medius, L4/L5/S1 paraspinal muscles were also within normal limits but were technically difficult to assess due to body habitus  Conclusion: There are chronic neurogenic changes in muscles sharing L5 innervation consistent with an L5 radiculopathy. Recommend MRI lumbar spine.  The normal conductions do not support peroneal compression at the fibular head, demyelinating or axonal polyneuropathy or sciatic neuropathy. However, given the scant EMG changes from the  back and more proximal muscles as compared to the rest of the limb muscles, would also consider pelvic  MRI as clinically warranted to rule out any problem involving the plexus.    Sarina Ill, MD  [facility] [address] [place], [place] [address]  Phone [phone] Fax [phone]

## 2014-07-01 ENCOUNTER — Telehealth: Payer: Self-pay | Admitting: Neurology

## 2014-07-01 DIAGNOSIS — E65 Localized adiposity: Secondary | ICD-10-CM

## 2014-07-01 DIAGNOSIS — G600 Hereditary motor and sensory neuropathy: Secondary | ICD-10-CM

## 2014-07-01 DIAGNOSIS — G4710 Hypersomnia, unspecified: Secondary | ICD-10-CM

## 2014-07-01 DIAGNOSIS — R0683 Snoring: Secondary | ICD-10-CM

## 2014-07-01 NOTE — Telephone Encounter (Signed)
Dr. Jaynee Eagles, refers patient for attended sleep study.  Height: 5'9"  Weight: 226 lb  BMI: 33.36  Past Medical History:  Migraine    . Gallstones   . Seizures 2010-2013    Brain seizures     Sleep Symptoms: Patient reports excessive daytime sleepiness and snoring, fatigue, racing thoughts, headache, and not enough sleep. Given increased risk of OSA associated with Charcot-Marie-Tooth   Epworth Score: Unable to reach patient to go over ESS questions.    Medication:  No medication listed   Ins: [employer]   Assessment & Plan: 43 year old female here for evaluation or right foot drop and FHx of CMT. She has mildly tapering distal legs, weakness of right DF, high right arch and flat left foot. Sensation and reflexes intact. She also has reported right arm weakness and clumsiness and a history of cervical spine surgery, seizures and possible MS due to "white spots on the brain" in the past.   EMG/NCS of both legs and right arm to evaluate foot drop, right arm clumsiness and eval for CMT (FHx) MRI of the brain and neck to evaluate for previous reports of seizures and possible MS ("whote spots on the brain") with right-sided weakness/clumsiness. Need previous records, will request.  Encouraged smoking cessation Labs today Patient reports excessive daytime sleepiness and snoring. Given increased risk of OSA associated with Charcot-Marie-Tooth, suggest a sleep study.    Please review patient information and submit instructions for scheduling and orders for sleep technologist. Thank you.

## 2014-07-01 NOTE — Telephone Encounter (Signed)
Pt is calling wanting MRI and NCV/EMG results.  Please call and advise.

## 2014-07-02 NOTE — Telephone Encounter (Signed)
Patient with neuromuscular disorder, CMT- has a higher risk f of OSA , plus risk of obesity related OSA. Morning headaches, fatigue.

## 2014-07-04 ENCOUNTER — Other Ambulatory Visit: Payer: Self-pay | Admitting: Neurology

## 2014-07-04 DIAGNOSIS — M5417 Radiculopathy, lumbosacral region: Secondary | ICD-10-CM

## 2014-07-04 DIAGNOSIS — M21371 Foot drop, right foot: Secondary | ICD-10-CM

## 2014-07-17 ENCOUNTER — Telehealth: Payer: Self-pay | Admitting: Neurology

## 2014-07-17 ENCOUNTER — Other Ambulatory Visit: Payer: Self-pay | Admitting: Neurology

## 2014-07-17 MED ORDER — GABAPENTIN 300 MG PO CAPS: 300.0000 mg | ORAL_CAPSULE | Freq: Three times a day (TID) | ORAL | Status: DC

## 2014-07-17 NOTE — Telephone Encounter (Signed)
I will call in a prescription for Neurontin.  She has not heard back about her Lumbar MRI. Colletta [place] can you please follow up on her MRI referral and call patient with information please? Thank you

## 2014-07-17 NOTE — Telephone Encounter (Signed)
Here's one with more pain.

## 2014-07-17 NOTE — Telephone Encounter (Signed)
Patient stated the pain has gotten worse in back and starting to travel down her leg.  Please call and advise.

## 2014-07-18 NOTE — Telephone Encounter (Signed)
Patient is scheduled for 07-24-14

## 2014-07-22 DIAGNOSIS — M5417 Radiculopathy, lumbosacral region: Secondary | ICD-10-CM | POA: Insufficient documentation

## 2014-07-24 ENCOUNTER — Ambulatory Visit (INDEPENDENT_AMBULATORY_CARE_PROVIDER_SITE_OTHER): Payer: No Typology Code available for payment source

## 2014-07-24 DIAGNOSIS — M5417 Radiculopathy, lumbosacral region: Secondary | ICD-10-CM

## 2014-07-24 DIAGNOSIS — M21371 Foot drop, right foot: Secondary | ICD-10-CM

## 2014-07-29 ENCOUNTER — Telehealth: Payer: Self-pay | Admitting: Neurology

## 2014-07-29 NOTE — Telephone Encounter (Signed)
Contacted patient to schedule her sleep study and she has asked to wait on scheduling until she hears back from her MRI results. She asked for someone to call her back regarding the results if they are available at this time. Thanks

## 2014-07-29 NOTE — Telephone Encounter (Signed)
Pt is calling to get results of MRI.  Please call and advise.

## 2014-07-30 ENCOUNTER — Other Ambulatory Visit: Payer: Self-pay | Admitting: Neurology

## 2014-07-30 DIAGNOSIS — M21371 Foot drop, right foot: Secondary | ICD-10-CM

## 2014-07-30 DIAGNOSIS — M5417 Radiculopathy, lumbosacral region: Secondary | ICD-10-CM

## 2014-07-30 NOTE — Telephone Encounter (Signed)
Spoke to patient. 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 This can explain her right radiculopathy with right foot drop. Will refer to [facility].   Also could not locate her records from 2006 when she had a negative MS workup. Discussed with patient, can repeat workup to evaluate the abnormal white matter on MRI. She declines. Instead will repeat MRI in one year. Patient understands and acknowledges.

## 2014-07-31 NOTE — Telephone Encounter (Signed)
Patient wanted to inform Dr. Jaynee Eagles that she could get her records from [facility] in [place], [place].  Please request records for Teresa Rhodes, her former name.  FYI

## 2014-08-01 NOTE — Telephone Encounter (Signed)
Hilda Blades, can you please look into getting the patient records?

## 2014-09-12 ENCOUNTER — Telehealth: Payer: Self-pay | Admitting: Neurology

## 2014-09-12 ENCOUNTER — Ambulatory Visit (INDEPENDENT_AMBULATORY_CARE_PROVIDER_SITE_OTHER): Payer: No Typology Code available for payment source | Admitting: Neurology

## 2014-09-12 ENCOUNTER — Encounter: Payer: Self-pay | Admitting: Neurology

## 2014-09-12 VITALS — BP 137/82 | HR 85 | Ht 69.0 in | Wt 231.0 lb

## 2014-09-12 DIAGNOSIS — M5417 Radiculopathy, lumbosacral region: Secondary | ICD-10-CM

## 2014-09-12 DIAGNOSIS — M5416 Radiculopathy, lumbar region: Secondary | ICD-10-CM

## 2014-09-12 DIAGNOSIS — M21371 Foot drop, right foot: Secondary | ICD-10-CM | POA: Diagnosis not present

## 2014-09-12 MED ORDER — DULOXETINE HCL 60 MG PO CPEP: 60.0000 mg | ORAL_CAPSULE | Freq: Every day | ORAL | Status: DC

## 2014-09-12 NOTE — Progress Notes (Signed)
[facility]    Provider:  Dr Jaynee Eagles Referring Provider: No ref. provider found Primary Care Physician:  No primary care provider on file.  CC:  Back pain  HPI:  Teresa Rhodes is a 44 y.o. female here as a follow up. Back is still hurting. Pain is shooting down right leg to foot. She went to [facility] and has ordered a foot orthoses for her foot drop. Tuesday she is going to get the [facility] into her lumbar spine. She is going to [place] for the [facility]. She is in a lot of pain. She is having a lot of depression, she can't think, she can't concentrate, she is very sad in the office today and tears up. Her brain gets foggy. She is not under a lot of stress, she can't understand why she is sad.   EMG/NCS was c/with an right L5 radiculopathy.   MRI of the lumbar spine 07/24/2014: Abnormal MRI lumbar spine (without) demonstrating: 1. At L4-5: disc bulging and facet hypertrophy and posterior epidural lipomatosis with mild biforaminal stenosis; mild right lateral recess stenosis; potential impingement upon the descending right L5 root. 2. At L5-S1: significant facet arthropathy and hypertrophy, with mild left foraminal stenosis  3. Possible subtle chronic fracture line in the left anterior S1 segment   MRi brain 06/26/2014:IMPRESSION:  Abnormal MRI brain (with and without) demonstrating: 1. Scattered round and ovoid, subcortical and juxtacortical foci of T2 hyperintensities. These findings are non-specific and considerations include autoimmune, inflammatory, post-infectious, microvascular ischemic or migraine associated etiologies.  2. No abnormal enhancing lesions. No acute findings.  MRI of the cervical spine: 1. No intrinsic, compressive or abnormal enhancing spinal cord lesions. 2. No spinal stenosis or foraminal narrowing. 3. Anterior cervical fusion with metal hardware from C5-C6-C7 is noted.    Initial appt 06/12/2014: Teresa Rhodes is a 44 y.o. female  here as a referral from Dr. Jacqualyn Posey for right arm weakness, foot drop, FHx CMT.  44 year old female presents the office with complaints of right foot pain. She states this has been ongoing for approximate one year and has been progressive. She states that her entire life she had a flat feet. She states that she has pain in her heels particularly after sitting. She does state that pain is intermittent and is not on a consistent basis. She has been taking over-the-counter Aleve as well as using inserts in her shoes without much resolution of symptoms.  Has weakness in the right foot, sometimes she trips over the foot. The foot doesn't drag. Tingling in both feet. Never had good balance. She played baseball when younger, rode bikes, no weakness as an adolescent. Weakness started around 37 after neck surgeries. Weakness in the right arm over the last year. The right hand "doesn't work very well" sometimes if she overuses it. Father and brother had CMT. Never been evaluated or genetically tested. Left arm and hand, left leg unaffected.   Denies frequent sprained ankles or difficulty running and keeping up with peers as a child. Has always been a little clumsy walking. She snores, very tired the during.   She has a PMHx of possible seizures vs MS and she was worked up in 2010-2012, "white spots were on the brain".   Reviewed notes, labs and imaging from outside physicians, which showed: patient was seen at the [facility] for right foot pain and was diagnosed with plantar fasciitis. She was referred to neurology for right arm weakness and FHx of CMT.  Review of Systems: Patient complains of symptoms per HPI as well as the following symptoms back pain, walking difficulty, numbness, headache, tremors, depression. Pertinent negatives per HPI. All others negative.   History   Social History  . Marital Status: Married    Spouse Name: N/A  . Number of Children: 1  . Years of Education: N/A    Occupational History  . Civil engineer, contracting    Social History Main Topics  . Smoking status: Current Every Day Smoker -- 0.50 packs/day for 3 years    Types: Cigarettes  . Smokeless tobacco: Never Used     Comment: pt given handout  . Alcohol Use: No  . Drug Use: No  . Sexual Activity: Not on file   Other Topics Concern  . Not on file   Social History Narrative    Family History  Problem Relation Age of Onset  . Ovarian cancer Mother   . Ulcerative colitis Mother   . Liver disease Maternal Grandfather   . Kidney disease Father 21  . Kidney disease Brother 46  . Colon cancer Neg Hx   . Colon polyps Neg Hx   . Esophageal cancer Neg Hx     Past Medical History  Diagnosis Date  . Migraine   . Gallstones   . Seizures 2010-2013    Brain seizures    Past Surgical History  Procedure Laterality Date  . Cholecystectomy  2002  . Neck surgery  2005 & 2009    x2    Current Outpatient Prescriptions  Medication Sig Dispense Refill  . gabapentin (NEURONTIN) 300 MG capsule Take 1 capsule (300 mg total) by mouth 3 (three) times daily. May cause drowsiness. 90 capsule 11  . benzonatate (TESSALON) 100 MG capsule   0  . PROAIR HFA 108 (90 BASE) MCG/ACT inhaler   1   No current facility-administered medications for this visit.    Allergies as of 09/12/2014  . (No Known Allergies)    Vitals: BP 137/82 mmHg  Pulse 85  Ht 5\' 9"  (1.753 m)  Wt 231 lb (104.781 kg)  BMI 34.10 kg/m2 Last Weight:  Wt Readings from Last 1 Encounters:  09/12/14 231 lb (104.781 kg)   Last Height:   Ht Readings from Last 1 Encounters:  09/12/14 5\' 9"  (1.753 m)   Cognition:  The patient is oriented to person, place, and time;   recent and remote memory intact;   language fluent;   normal attention, concentration,   fund of knowledge  Cranial Nerves:  The pupils are equal, round, and reactive to light. The fundi are normal and spontaneous venous pulsations are present.  Visual fields are full to finger confrontation. Extraocular movements are intact. Trigeminal sensation is intact and the muscles of mastication are normal. The face is symmetric. The palate elevates in the midline. Voice is normal. Shoulder shrug is normal. The tongue has normal motion without fasciculations.  Strength:   Right foot 3+/5. Otherwise strength is V/V in the upper and lower limbs.     Assessment/Plan:  44 year old female here for f/u or right foot drop and FHx of CMT. EMG/NCS was c/w right L5 radic.  She is getting ESI this week and is following with [facility]. Will start Cymbalta for radicular pain, may also help with sadness that patient is describing today. Recommend f/u with therapy or psychiatry if symptoms don't improve.    Sarina Ill, MD  [facility] [address] [place], [place] [address]  Phone [phone] Fax [phone]  A total of 30 minutes was spent face-to-face with this patient. Over half this time was spent on counseling patient on the lumbar radiculopathy diagnosis and different diagnostic and therapeutic options available.

## 2014-09-12 NOTE — Patient Instructions (Signed)
Overall you are doing fairly well but I do want to suggest a few things today:   Remember to drink plenty of fluid, eat healthy meals and do not skip any meals. Try to eat protein with a every meal and eat a healthy snack such as fruit or nuts in between meals. Try to keep a regular sleep-wake schedule and try to exercise daily, particularly in the form of walking, 20-30 minutes a day, if you can.   As far as your medications are concerned, I would like to suggest: Cymbalta 60mg  daily  I would like to see you back in 4-6 months as needed, sooner if we need to. Please call us with any interim questions, concerns, problems, updates or refill requests.   Please also call us for any test results so we can go over those with you on the phone.  My clinical assistant and will answer any of your questions and relay your messages to me and also relay most of my messages to you.   Our phone number is [phone]. We also have an after hours call service for urgent matters and there is a physician on-call for urgent questions. For any emergencies you know to call 911 or go to the nearest emergency room

## 2014-09-12 NOTE — Telephone Encounter (Signed)
The patient was contacted to follow up on Dr. Cathren Laine recommendation that she proceed with a sleep study.  She had previously requested to hold off until after she received MRI results.  Pt now requests to hold off due to an upcoming nerve block.  She will speak with Dr. Jaynee Eagles regarding the sleep study during her upcoming appt.  The referral will be closed unless otherwise notified.

## 2014-09-17 ENCOUNTER — Telehealth: Payer: Self-pay | Admitting: *Deleted

## 2014-09-17 NOTE — Telephone Encounter (Signed)
Release faxed to [facility] and to Dr. Hildred Alamin office on 09/17/14.

## 2014-09-18 ENCOUNTER — Telehealth: Payer: Self-pay | Admitting: *Deleted

## 2014-09-18 NOTE — Telephone Encounter (Signed)
Talked with patient to let her know we received the medical records we had requested from Teresa Rhodes and [place] and Dr. Jaynee Eagles will review them over the weekend and give her a call back to speak with her about them. Patient verbalized understanding.

## 2014-10-07 ENCOUNTER — Encounter: Payer: Self-pay | Admitting: Neurology

## 2014-10-08 ENCOUNTER — Encounter: Payer: Self-pay | Admitting: Neurology

## 2014-10-10 ENCOUNTER — Telehealth: Payer: Self-pay | Admitting: *Deleted

## 2014-10-10 NOTE — Telephone Encounter (Signed)
Talked with patient and she said she will drop off a copy of her LP report for Dr. Jaynee Eagles in the office tomorrow.

## 2014-10-17 ENCOUNTER — Encounter: Payer: Self-pay | Admitting: Neurology

## 2014-11-01 ENCOUNTER — Emergency Department (HOSPITAL_COMMUNITY): Payer: No Typology Code available for payment source

## 2014-11-01 ENCOUNTER — Encounter (HOSPITAL_COMMUNITY): Payer: Self-pay | Admitting: Emergency Medicine

## 2014-11-01 ENCOUNTER — Emergency Department (HOSPITAL_COMMUNITY)
Admission: EM | Admit: 2014-11-01 | Discharge: 2014-11-01 | Disposition: A | Discharge status: Home / Self Care | Payer: No Typology Code available for payment source | Attending: Emergency Medicine | Admitting: Emergency Medicine

## 2014-11-01 DIAGNOSIS — Z720 Tobacco use: Secondary | ICD-10-CM | POA: Diagnosis not present

## 2014-11-01 DIAGNOSIS — R112 Nausea with vomiting, unspecified: Secondary | ICD-10-CM | POA: Diagnosis not present

## 2014-11-01 DIAGNOSIS — R1084 Generalized abdominal pain: Secondary | ICD-10-CM | POA: Insufficient documentation

## 2014-11-01 DIAGNOSIS — G43909 Migraine, unspecified, not intractable, without status migrainosus: Secondary | ICD-10-CM | POA: Diagnosis not present

## 2014-11-01 DIAGNOSIS — R000 Tachycardia, unspecified: Secondary | ICD-10-CM | POA: Insufficient documentation

## 2014-11-01 DIAGNOSIS — R197 Diarrhea, unspecified: Secondary | ICD-10-CM | POA: Insufficient documentation

## 2014-11-01 DIAGNOSIS — Z79899 Other long term (current) drug therapy: Secondary | ICD-10-CM | POA: Insufficient documentation

## 2014-11-01 DIAGNOSIS — Z3202 Encounter for pregnancy test, result negative: Secondary | ICD-10-CM | POA: Insufficient documentation

## 2014-11-01 DIAGNOSIS — G40909 Epilepsy, unspecified, not intractable, without status epilepticus: Secondary | ICD-10-CM | POA: Insufficient documentation

## 2014-11-01 DIAGNOSIS — Z87442 Personal history of urinary calculi: Secondary | ICD-10-CM | POA: Diagnosis not present

## 2014-11-01 DIAGNOSIS — N390 Urinary tract infection, site not specified: Secondary | ICD-10-CM | POA: Diagnosis not present

## 2014-11-01 DIAGNOSIS — R109 Unspecified abdominal pain: Secondary | ICD-10-CM | POA: Diagnosis present

## 2014-11-01 DIAGNOSIS — D72829 Elevated white blood cell count, unspecified: Secondary | ICD-10-CM | POA: Diagnosis not present

## 2014-11-01 LAB — CBC WITH DIFFERENTIAL/PLATELET
Basophils Absolute: 0 10*3/uL (ref 0.0–0.1)
Basophils Relative: 0 % (ref 0–1)
Eosinophils Absolute: 0 10*3/uL (ref 0.0–0.7)
Eosinophils Relative: 0 % (ref 0–5)
HCT: 43 % (ref 36.0–46.0)
Hemoglobin: 14.3 g/dL (ref 12.0–15.0)
Lymphocytes Relative: 4 % — ABNORMAL LOW (ref 12–46)
Lymphs Abs: 1.1 10*3/uL (ref 0.7–4.0)
MCH: 28.9 pg (ref 26.0–34.0)
MCHC: 33.3 g/dL (ref 30.0–36.0)
MCV: 86.9 fL (ref 78.0–100.0)
Monocytes Absolute: 1.1 10*3/uL — ABNORMAL HIGH (ref 0.1–1.0)
Monocytes Relative: 4 % (ref 3–12)
Neutro Abs: 25.7 10*3/uL — ABNORMAL HIGH (ref 1.7–7.7)
Neutrophils Relative %: 92 % — ABNORMAL HIGH (ref 43–77)
Platelets: 248 10*3/uL (ref 150–400)
RBC: 4.95 MIL/uL (ref 3.87–5.11)
RDW: 13.9 % (ref 11.5–15.5)
Smear Review: ADEQUATE
WBC: 27.9 10*3/uL — ABNORMAL HIGH (ref 4.0–10.5)

## 2014-11-01 LAB — COMPREHENSIVE METABOLIC PANEL
ALBUMIN: 3.5 g/dL (ref 3.5–5.2)
ALK PHOS: 171 U/L — AB (ref 39–117)
ALT: 148 U/L — AB (ref 0–35)
AST: 60 U/L — AB (ref 0–37)
Anion gap: 11 (ref 5–15)
BUN: 8 mg/dL (ref 6–23)
CALCIUM: 9.3 mg/dL (ref 8.4–10.5)
CO2: 27 mmol/L (ref 19–32)
Chloride: 99 mmol/L (ref 96–112)
Creatinine, Ser: 0.75 mg/dL (ref 0.50–1.10)
GFR calc non Af Amer: >90 mL/min (ref >90)
Glucose, Bld: 120 mg/dL — ABNORMAL HIGH (ref 70–99)
Potassium: 4.1 mmol/L (ref 3.5–5.1)
Sodium: 137 mmol/L (ref 135–145)
TOTAL PROTEIN: 7 g/dL (ref 6.0–8.3)
Total Bilirubin: 0.8 mg/dL (ref 0.3–1.2)

## 2014-11-01 LAB — URINALYSIS, ROUTINE W REFLEX MICROSCOPIC
Glucose, UA: NEGATIVE mg/dL
Hgb urine dipstick: NEGATIVE
Ketones, ur: 15 mg/dL — AB
Nitrite: NEGATIVE
Protein, ur: 100 mg/dL — AB
Specific Gravity, Urine: 1.026 (ref 1.005–1.030)
Urobilinogen, UA: 1 mg/dL (ref 0.0–1.0)
pH: 8.5 — ABNORMAL HIGH (ref 5.0–8.0)

## 2014-11-01 LAB — I-STAT CG4 LACTIC ACID, ED
Lactic Acid, Venous: 2.98 mmol/L (ref 0.5–2.0)
Lactic Acid, Venous: 1.21 mmol/L (ref 0.5–2.0)

## 2014-11-01 LAB — URINE MICROSCOPIC-ADD ON

## 2014-11-01 LAB — LIPASE, BLOOD: Lipase: 17 U/L (ref 11–59)

## 2014-11-01 LAB — PREGNANCY, URINE: PREG TEST UR: NEGATIVE

## 2014-11-01 MED ORDER — PROMETHAZINE HCL 25 MG PO TABS: 25.0000 mg | ORAL_TABLET | Freq: Four times a day (QID) | ORAL | Status: DC | PRN

## 2014-11-01 MED ORDER — SODIUM CHLORIDE 0.9 % IV BOLUS (SEPSIS)
1000.0000 mL | Freq: Once | INTRAVENOUS | Status: AC
  Administered 2014-11-01: 1000 mL via INTRAVENOUS

## 2014-11-01 MED ORDER — IOHEXOL 300 MG/ML  SOLN
25.0000 mL | Freq: Once | INTRAMUSCULAR | Status: AC | PRN
  Administered 2014-11-01: 25 mL via ORAL

## 2014-11-01 MED ORDER — CEFTRIAXONE SODIUM 1 G IJ SOLR
1.0000 g | Freq: Once | INTRAMUSCULAR | Status: AC
  Administered 2014-11-01: 1 g via INTRAVENOUS
  Filled 2014-11-01: qty 10

## 2014-11-01 MED ORDER — IOHEXOL 300 MG/ML  SOLN
100.0000 mL | Freq: Once | INTRAMUSCULAR | Status: AC | PRN
  Administered 2014-11-01: 100 mL via INTRAVENOUS

## 2014-11-01 MED ORDER — ONDANSETRON HCL 4 MG/2ML IJ SOLN
4.0000 mg | Freq: Once | INTRAMUSCULAR | Status: AC
  Administered 2014-11-01: 4 mg via INTRAVENOUS
  Filled 2014-11-01: qty 2

## 2014-11-01 MED ORDER — CEPHALEXIN 500 MG PO CAPS: 500.0000 mg | ORAL_CAPSULE | Freq: Two times a day (BID) | ORAL | Status: DC

## 2014-11-01 MED ORDER — HYDROMORPHONE HCL 1 MG/ML IJ SOLN
1.0000 mg | Freq: Once | INTRAMUSCULAR | Status: AC
  Administered 2014-11-01: 1 mg via INTRAVENOUS
  Filled 2014-11-01: qty 1

## 2014-11-01 NOTE — ED Notes (Signed)
cG-4 of 2.98 to Dr Loma Sousa

## 2014-11-01 NOTE — ED Provider Notes (Signed)
CSN: [id number]     Arrival date & time 11/01/14  1532 History   First MD Initiated Contact with Patient 11/01/14 1609     Chief Complaint  Patient presents with  . Abdominal Pain  . Emesis  . Diarrhea     Patient is a 44 y.o. female presenting with abdominal pain, vomiting, and diarrhea. The history is provided by the patient. No language interpreter was used.  Abdominal Pain Associated symptoms: diarrhea and vomiting   Emesis Associated symptoms: abdominal pain and diarrhea   Diarrhea Associated symptoms: abdominal pain and vomiting    Ms. Nasby presents for evaluation of abdominal pain, vomiting, diarrhea. She had lumbar surgery a week ago was doing fine and her recovery. Initially she reported feeling fatigued and under the weather. Today she continued to feel fatigued and she ate part of a chicken pot around noon. At 1:15 she developed crampy upper abdominal pain, severe vomiting, severe diarrhea. The symptoms were preceded with diffuse diaphoresis. She denies any fevers, chest pain, shortness of breath. She has a history of cholecystectomy. She denies any injuries to her surgery site and feels some pain at that area which she thinks is appropriate following her surgery. Symptoms are severe, constant.  Past Medical History  Diagnosis Date  . Migraine   . Gallstones   . Seizures 2010-2013    Brain seizures   Past Surgical History  Procedure Laterality Date  . Cholecystectomy  2002  . Neck surgery  2005 & 2009    x2   Family History  Problem Relation Age of Onset  . Ovarian cancer Mother   . Ulcerative colitis Mother   . Liver disease Maternal Grandfather   . Kidney disease Father 66  . Kidney disease Brother 59  . Colon cancer Neg Hx   . Colon polyps Neg Hx   . Esophageal cancer Neg Hx    History  Substance Use Topics  . Smoking status: Current Every Day Smoker -- 0.50 packs/day for 3 years    Types: Cigarettes  . Smokeless tobacco: Never Used     Comment: pt  given handout  . Alcohol Use: No   OB History    No data available     Review of Systems  Gastrointestinal: Positive for vomiting, abdominal pain and diarrhea.  All other systems reviewed and are negative.     Allergies  Review of patient's allergies indicates no known allergies.  Home Medications   Prior to Admission medications   Medication Sig Start Date End Date Taking? Authorizing Provider  benzonatate (TESSALON) 100 MG capsule  05/09/14   Historical Provider, MD  DULoxetine (CYMBALTA) 60 MG capsule Take 1 capsule (60 mg total) by mouth daily. 09/12/14   Melvenia Beam, MD  gabapentin (NEURONTIN) 300 MG capsule Take 1 capsule (300 mg total) by mouth 3 (three) times daily. May cause drowsiness. 07/17/14   Melvenia Beam, MD  PROAIR HFA 108 (90 BASE) MCG/ACT inhaler  04/09/14   Historical Provider, MD   BP 136/54 mmHg  Pulse 115  Temp(Src) 98.2 F (36.8 C) (Rectal)  Resp 18  Ht 5\' 9"  (1.753 m)  Wt 226 lb (102.513 kg)  BMI 33.36 kg/m2  SpO2 99% Physical Exam  Constitutional: She is oriented to person, place, and time. She appears well-developed and well-nourished.  Appears uncomfortable  HENT:  Head: Normocephalic and atraumatic.  Cardiovascular: Regular rhythm.   No murmur heard. Tachycardic  Pulmonary/Chest: Effort normal and breath sounds normal. No respiratory distress.  Abdominal: Soft.  Moderate diffuse abdominal tenderness with voluntary guarding no rebound  Musculoskeletal: She exhibits no edema or tenderness.  Lower lumbar surgical sites with Steri-Strips intact. There is no surrounding erythema.  Neurological: She is alert and oriented to person, place, and time.  Skin: Skin is warm and dry.  Psychiatric: She has a normal mood and affect. Her behavior is normal.  Nursing note and vitals reviewed.   ED Course  Procedures (including critical care time) Angiocath insertion Performed by: Quintella Reichert  Consent: Verbal consent obtained. Risks and  benefits: risks, benefits and alternatives were discussed Time out: Immediately prior to procedure a "time out" was called to verify the correct patient, procedure, equipment, support staff and site/side marked as required.  Preparation: Patient was prepped and draped in the usual sterile fashion.  Vein Location: right AC   Ultrasound Guided  Gauge: 20  Normal blood return and flush without difficulty Patient tolerance: Patient tolerated the procedure well with no immediate complications.    Labs Review Labs Reviewed  URINALYSIS, ROUTINE W REFLEX MICROSCOPIC - Abnormal; Notable for the following:    Color, Urine AMBER (*)    APPearance HAZY (*)    pH 8.5 (*)    Bilirubin Urine SMALL (*)    Ketones, ur 15 (*)    Protein, ur 100 (*)    Leukocytes, UA SMALL (*)    All other components within normal limits  CBC WITH DIFFERENTIAL/PLATELET - Abnormal; Notable for the following:    WBC 27.9 (*)    Neutrophils Relative % 92 (*)    Lymphocytes Relative 4 (*)    Neutro Abs 25.7 (*)    Monocytes Absolute 1.1 (*)    All other components within normal limits  COMPREHENSIVE METABOLIC PANEL - Abnormal; Notable for the following:    Glucose, Bld 120 (*)    AST 60 (*)    ALT 148 (*)    Alkaline Phosphatase 171 (*)    All other components within normal limits  URINE MICROSCOPIC-ADD ON - Abnormal; Notable for the following:    Squamous Epithelial / LPF FEW (*)    Bacteria, UA MANY (*)    All other components within normal limits  I-STAT CG4 LACTIC ACID, ED - Abnormal; Notable for the following:    Lactic Acid, Venous 2.98 (*)    All other components within normal limits  PREGNANCY, URINE  LIPASE, BLOOD  I-STAT CG4 LACTIC ACID, ED    Imaging Review Ct Abdomen Pelvis W Contrast  11/01/2014   CLINICAL DATA:  Lumbar surgery 1 week ago. Pain throughout the abdomen with emesis and diarrhea.  EXAM: CT ABDOMEN AND PELVIS WITH CONTRAST  TECHNIQUE: Multidetector CT imaging of the abdomen and  pelvis was performed using the standard protocol following bolus administration of intravenous contrast.  CONTRAST:  129mL OMNIPAQUE IOHEXOL 300 MG/ML  SOLN  COMPARISON:  Lumbar MR 07/24/2014  FINDINGS: Lung bases are clear. No pleural or pericardial fluid. There is mild fatty change of the liver. No focal lesion. Previous cholecystectomy. The spleen is normal. The pancreas is normal. The adrenal glands are normal. The kidneys are normal. The aorta and IVC are normal. No retroperitoneal mass or adenopathy. No free intraperitoneal fluid or air. No bowel pathology. If the appendix is present, it is very tiny. Certainly not abnormal. Uterus and adnexal regions appear normal. Previous surgical changes in the lower lumbar spine on the right at L4-5, as expected 1 week following surgery. No abnormality of significance higher  in the spine.  IMPRESSION: No acute or significant finding. No cause of the described symptoms is identified. Surgical changes on the right at L4-5 without unexpected finding.   Electronically Signed   By: Nelson Chimes M.D.   On: 11/01/2014 20:38     EKG Interpretation None      MDM   Final diagnoses:  Generalized abdominal pain  Nausea, vomiting and diarrhea  Leukocytosis  Acute UTI    Patient here for evaluation of vomiting, diarrhea, abdominal pain. Initial evaluation with diffuse abdominal tenderness, CBC with leukocytosis and elevated lactate. CT abdomen obtained to evaluate for acute appendicitis, bowel perforation. CT abdomen without any acute changes. UA is consistent with mild urinary tract infection, patient treated with Rocephin and prescription for Keflex. I repeat evaluation the emergency department patient is feeling improved and tolerating oral fluids. Lactate a clear on repeat evaluation. Surgical sites appears to be healing well there is no evidence of local infection. Discussed with patient homecare for vomiting, diarrhea and close PCP follow-up as well as return  precautions.    Quintella Reichert, MD 11/02/14 [phone]

## 2014-11-01 NOTE — ED Notes (Signed)
Pt arrived with mother in law to ED with c/o n/v/d and abdominal pain. Pt had lumbar/sacral back surgery 1 week ago. Pt stated yesterday she was not feeling good and weak yesterday then today took pain medication around 0800 walked around some, had a very small amount to eat at lunch then a little after lunch started getting cold sweats and then vomited multiple times and diarrhea multiple times. Pt has been having uncontrolled bowel movements. Denies any increase in pain from back surgery. Denies being around anyone who has been sick.

## 2014-11-01 NOTE — Discharge Instructions (Signed)
Abdominal Pain °Many things can cause abdominal pain. Usually, abdominal pain is not caused by a disease and will improve without treatment. It can often be observed and treated at home. Your health care provider will do a physical exam and possibly order blood tests and X-rays to help determine the seriousness of your pain. However, in many cases, more time must pass before a clear cause of the pain can be found. Before that point, your health care provider may not know if you need more testing or further treatment. °HOME CARE INSTRUCTIONS  °Monitor your abdominal pain for any changes. The following actions may help to alleviate any discomfort you are experiencing: °· Only take over-the-counter or prescription medicines as directed by your health care provider. °· Do not take laxatives unless directed to do so by your health care provider. °· Try a clear liquid diet (broth, tea, or water) as directed by your health care provider. Slowly move to a bland diet as tolerated. °SEEK MEDICAL CARE IF: °· You have unexplained abdominal pain. °· You have abdominal pain associated with nausea or diarrhea. °· You have pain when you urinate or have a bowel movement. °· You experience abdominal pain that wakes you in the night. °· You have abdominal pain that is worsened or improved by eating food. °· You have abdominal pain that is worsened with eating fatty foods. °· You have a fever. °SEEK IMMEDIATE MEDICAL CARE IF:  °· Your pain does not go away within 2 hours. °· You keep throwing up (vomiting). °· Your pain is felt only in portions of the abdomen, such as the right side or the left lower portion of the abdomen. °· You pass bloody or black tarry stools. °MAKE SURE YOU: °· Understand these instructions.   °· Will watch your condition.   °· Will get help right away if you are not doing well or get worse.   °Document Released: 04/14/2005 Document Revised: 07/10/2013 Document Reviewed: 03/14/2013 °[employer]® Patient Information  ©2015 [employer]. This information is not intended to replace advice given to you by your health care provider. Make sure you discuss any questions you have with your health care provider. ° °Nausea and Vomiting °Nausea is a sick feeling that often comes before throwing up (vomiting). Vomiting is a reflex where stomach contents come out of your mouth. Vomiting can cause severe loss of body fluids (dehydration). Children and elderly adults can become dehydrated quickly, especially if they also have diarrhea. Nausea and vomiting are symptoms of a condition or disease. It is important to find the cause of your symptoms. °CAUSES  °· Direct irritation of the stomach lining. This irritation can result from increased acid production (gastroesophageal reflux disease), infection, food poisoning, taking certain medicines (such as nonsteroidal anti-inflammatory drugs), alcohol use, or tobacco use. °· Signals from the brain. These signals could be caused by a headache, heat exposure, an inner ear disturbance, increased pressure in the brain from injury, infection, a tumor, or a concussion, pain, emotional stimulus, or metabolic problems. °· An obstruction in the gastrointestinal tract (bowel obstruction). °· Illnesses such as diabetes, hepatitis, gallbladder problems, appendicitis, kidney problems, cancer, sepsis, atypical symptoms of a heart attack, or eating disorders. °· Medical treatments such as chemotherapy and radiation. °· Receiving medicine that makes you sleep (general anesthetic) during surgery. °DIAGNOSIS °Your caregiver may ask for tests to be done if the problems do not improve after a few days. Tests may also be done if symptoms are severe or if the reason for the nausea   and vomiting is not clear. Tests may include:  Urine tests.  Blood tests.  Stool tests.  Cultures (to look for evidence of infection).  X-rays or other imaging studies. Test results can help your caregiver make decisions about  treatment or the need for additional tests. TREATMENT You need to stay well hydrated. Drink frequently but in small amounts.You may wish to drink water, sports drinks, clear broth, or eat frozen ice pops or gelatin dessert to help stay hydrated.When you eat, eating slowly may help prevent nausea.There are also some antinausea medicines that may help prevent nausea. HOME CARE INSTRUCTIONS   Take all medicine as directed by your caregiver.  If you do not have an appetite, do not force yourself to eat. However, you must continue to drink fluids.  If you have an appetite, eat a normal diet unless your caregiver tells you differently.  Eat a variety of complex carbohydrates (rice, wheat, potatoes, bread), lean meats, yogurt, fruits, and vegetables.  Avoid high-fat foods because they are more difficult to digest.  Drink enough water and fluids to keep your urine clear or pale yellow.  If you are dehydrated, ask your caregiver for specific rehydration instructions. Signs of dehydration may include:  Severe thirst.  Dry lips and mouth.  Dizziness.  Dark urine.  Decreasing urine frequency and amount.  Confusion.  Rapid breathing or pulse. SEEK IMMEDIATE MEDICAL CARE IF:   You have blood or brown flecks (like coffee grounds) in your vomit.  You have black or bloody stools.  You have a severe headache or stiff neck.  You are confused.  You have severe abdominal pain.  You have chest pain or trouble breathing.  You do not urinate at least once every 8 hours.  You develop cold or clammy skin.  You continue to vomit for longer than 24 to 48 hours.  You have a fever. MAKE SURE YOU:   Understand these instructions.  Will watch your condition.  Will get help right away if you are not doing well or get worse. Document Released: 07/05/2005 Document Revised: 09/27/2011 Document Reviewed: 12/02/2010 [facility] Patient Information 2015 [place], [place]. This information is not  intended to replace advice given to you by your health care provider. Make sure you discuss any questions you have with your health care provider.  Leukocytosis Leukocytosis means you have more white blood cells than normal. White blood cells are made in your bone marrow. The main job of white blood cells is to fight infection. Having too many white blood cells is a common condition. It can develop as a result of many types of medical problems. CAUSES  In some cases, your bone marrow may be normal, but it is still making too many white blood cells. This could be the result of:  Infection.  Injury.  Physical stress.  Emotional stress.  Surgery.  Allergic reactions.  Tumors that do not start in the blood or bone marrow.  An inherited disease.  Certain medicines.  Pregnancy and labor. In other cases, you may have a bone marrow disorder that is causing your body to make too many white blood cells. Bone marrow disorders include:  Leukemia. This is a type of blood cancer.  Myeloproliferative disorders. These disorders cause blood cells to grow abnormally. SYMPTOMS  Some people have no symptoms. Others have symptoms due to the medical problem that is causing their leukocytosis. These symptoms may include:  Bleeding.  Bruising.  Fever.  Night sweats.  Repeated infections.  Weakness.  Weight loss. DIAGNOSIS  Leukocytosis is often found during blood tests that are done as part of a normal physical exam. Your caregiver will probably order other tests to help determine why you have too many white blood cells. These tests may include:  A complete blood count (CBC). This test measures all the types of blood cells in your body.  Chest X-rays, urine tests (urinalysis), or other tests to look for signs of infection.  Bone marrow aspiration. For this test, a needle is put into your bone. Cells from the bone marrow are removed through the needle. The cells are then examined under a  microscope. TREATMENT  Treatment is usually not needed for leukocytosis. However, if a disorder is causing your leukocytosis, it will need to be treated. Treatment may include:  Antibiotic medicines if you have a bacterial infection.  Bone marrow transplant. Your diseased bone marrow is replaced with healthy cells that will grow new bone marrow.  Chemotherapy. This is the use of drugs to kill cancer cells. HOME CARE INSTRUCTIONS  Only take over-the-counter or prescription medicines as directed by your caregiver.  Maintain a healthy weight. Ask your caregiver what weight is best for you.  Eat foods that are low in saturated fats and high in fiber. Eat plenty of fruits and vegetables.  Drink enough fluids to keep your urine clear or pale yellow.  Get 30 minutes of exercise at least 5 times a week. Check with your caregiver before starting a new exercise routine.  Limit caffeine and alcohol.  Do not smoke.  Keep all follow-up appointments as directed by your caregiver. SEEK MEDICAL CARE IF:  You feel weak or more tired than usual.  You develop chills, a cough, or nasal congestion.  You lose weight without trying.  You have night sweats.  You bruise easily. SEEK IMMEDIATE MEDICAL CARE IF:  You bleed more than normal.  You have chest pain.  You have trouble breathing.  You have a fever.  You have uncontrolled nausea or vomiting.  You feel dizzy or lightheaded. MAKE SURE YOU:  Understand these instructions.  Will watch your condition.  Will get help right away if you are not doing well or get worse. Document Released: 06/24/2011 Document Revised: 09/27/2011 Document Reviewed: 06/24/2011 [facility] Patient Information 2015 [place], [place]. This information is not intended to replace advice given to you by your health care provider. Make sure you discuss any questions you have with your health care provider.

## 2014-12-01 ENCOUNTER — Encounter: Payer: Self-pay | Admitting: Neurology

## 2014-12-12 ENCOUNTER — Ambulatory Visit: Payer: No Typology Code available for payment source | Attending: Physician Assistant

## 2014-12-12 ENCOUNTER — Encounter: Payer: Self-pay | Admitting: Physical Therapy

## 2014-12-12 VITALS — BP 160/95 | HR 100

## 2014-12-12 DIAGNOSIS — M545 Low back pain, unspecified: Secondary | ICD-10-CM

## 2014-12-12 DIAGNOSIS — M6281 Muscle weakness (generalized): Secondary | ICD-10-CM | POA: Insufficient documentation

## 2014-12-12 DIAGNOSIS — M79604 Pain in right leg: Secondary | ICD-10-CM

## 2014-12-12 DIAGNOSIS — Z9889 Other specified postprocedural states: Secondary | ICD-10-CM | POA: Insufficient documentation

## 2014-12-12 DIAGNOSIS — R262 Difficulty in walking, not elsewhere classified: Secondary | ICD-10-CM | POA: Diagnosis not present

## 2014-12-12 NOTE — Patient Instructions (Addendum)
Stabilization: Transverse Abdominus Contraction - Supine   Lie with knees bent, feet flat. Place fingers on abdominal muscles just inside pelvis. Contract abdominals, pulling naval toward spine. Feel muscle contract, but keep pelvis and back still. Repeat _10___ times per set. Do __2__ sets per session. Do __2__ sessions per day  Copyright  VHI. All rights reserved.   Heel Slide to Straight   Slide one leg down to straight. Return. Exhale during entire movement. Be sure pelvis does not rock forward, tilt, rotate, or tip to side. Do _10__ times per leg. Restabilize pelvis. Repeat with other leg. Do _2__ sets, _2__ times per day.  http://ss.exer.us/17   Copyright  VHI. All rights reserved.   Abduction / External Rotation: ROM (Supine)   Position (A) Patient: Bend left leg, foot flat on bed. Exhale slowly while performing Motion (B) -Move knee out to side, pelvis remains flat on bed, return to center. -Stop at point of tension in inner thigh. Repeat _10__ times. Repeat with other leg. Do _2__ sessions per day. Variation:   Copyright  VHI. All rights reserved.

## 2014-12-12 NOTE — Therapy (Signed)
[facility] [address], [place], [address] Phone: [phone]   Fax:  [phone]  Physical Therapy Evaluation  Patient Details  Name: Teresa Rhodes MRN: [id number] Date of Birth: April 15, 1971 Referring Provider:  Valinda Hoar,*  Encounter Date: 12/12/2014      PT End of Session - 12/12/14 1035    Visit Number 1   Number of Visits 8   Date for PT Re-Evaluation 01/23/15   PT Start Time 0845   PT Stop Time 0945   PT Time Calculation (min) 60 min   Activity Tolerance Patient tolerated treatment well   Behavior During Therapy [facility] for tasks assessed/performed      Past Medical History  Diagnosis Date  . Migraine   . Gallstones   . Seizures 2010-2013    Brain seizures  . Depression     Past Surgical History  Procedure Laterality Date  . Cholecystectomy  2002  . Neck surgery  2005 & 2009    x2    Filed Vitals:   12/12/14 0854  BP: 160/95  Pulse: 100  SpO2: 100%    Visit Diagnosis:  Muscle weakness (generalized) - Plan: PT plan of care cert/re-cert  Difficulty walking - Plan: PT plan of care cert/re-cert  Low back pain radiating to right lower extremity - Plan: PT plan of care cert/re-cert      Subjective Assessment - 12/12/14 1008    Subjective s/p lumbar laminectomy   Pertinent History Pt was referred for physical therapy s/p laminectomy 10/25/14. Approximately 1 year ago pt reports she started walking about 5 miles per day for exercise. She started having radicular low back pain that radiated all the way from the back to the R foot. In addition pt reports that she started having R foot weakness and began tripping over her foot. She went to see a podiatrist who told her that her R and L foot looked "different" and she was sent to a neurologist. The neurologist ordered an MRI of her brain and neck as well as a RLE NCV test. Pt has a family history of Charcot Lelan Pons Tooth disease  (brother and father) but NCV ruled her out for CMT. Subsequently they performed a lumbar MRI which found a L5 nerve root compression. She was sent to an orthopedist who tried an epidural injection which helped for about 5 days but symptoms returned. She was scheduled for low back surgery and was placed in an AFO which she only wore for about 5 days prior to her back surgery. She underwent a lumbar laminectomy without fusion. Pt has noticed improvement in her back and RLE pain since the surgery. She reports that her plantarflexion strength has returned and a lot of her dorsiflexion strength has returned. However pt reports that she is still lacking R ankle dorsiflexion strength. Pt has a follow-up appointment with orthopedic surgeon 01/17/15. She has returned back to work on a limited schedule of 4 hours day. Pt was advised not to perform any bending, lifting, and twisting and was provided with an activity table describing activity restrictions after surgery.    Limitations Sitting;Lifting;Standing;Walking;House hold activities   How long can you sit comfortably? 30 minutes   How long can you stand comfortably? 30 minutes   How long can you walk comfortably? 30 minutes   Patient Stated Goals Pt would like to return back to horseback riding, walking long distances, and riding her motorcycle   Currently in Pain?  Yes   Pain Score 1   Worst 4/10, Best: 0/10   Pain Location Back   Pain Orientation Lower   Pain Type Surgical pain   Pain Onset More than a month ago   Pain Frequency Intermittent   Aggravating Factors  extended sitting, extended standing   Pain Relieving Factors changing positions, supine, ice Aleve   Effect of Pain on Daily Activities Pt is limited due to surgical precautions            [facility] PT Assessment - 12/12/14 0001    Assessment   Medical Diagnosis s/p laminectomy Z98.89    Onset Date/Surgical Date 10/25/14   Next MD Visit 01/17/15   Prior Therapy No   Precautions    Precautions Back   Precaution Booklet Issued No   Precaution Comments Per patient report, issued by surgeon   Restrictions   Weight Bearing Restrictions No   Balance Screen   Has the patient fallen in the past 6 months No   Has the patient had a decrease in activity level because of a fear of falling?  Yes   Is the patient reluctant to leave their home because of a fear of falling?  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 Myotomes are WNL bilaterally   Tone   Assessment Location Right Upper Extremity;Left Upper Extremity;Right Lower Extremity;Left Lower Extremity   ROM / Strength   AROM / PROM / Strength Strength   Strength   Overall Strength Within functional limits for tasks performed   Strength Assessment Site Hip;Knee;Ankle;Shoulder;Elbow;Wrist;Hand   Right/Left Shoulder Right;Left   Right Shoulder Flexion 4+/5   Left Shoulder Flexion 4+/5   Right/Left Elbow Right;Left   Right Elbow Flexion 4+/5    Right Elbow Extension 4+/5   Left Elbow Flexion 4+/5   Left Elbow Extension 4+/5   Right/Left Wrist Right;Left   Right Wrist Flexion 4+/5   Right Wrist Extension 4+/5   Left Wrist Flexion 4+/5   Left Wrist Extension 4+/5   Right/Left hand Right;Left   Right Hand Gross Grasp Functional  Finger adduction/abduction symmetrical and strong   Left Hand Gross Grasp Functional   Right/Left Hip Right;Left   Right Hip Flexion 4/5   Right Hip Extension 4/5   Right Hip External Rotation  4   Right Hip Internal Rotation  4   Right Hip ABduction 4/5   Right Hip ADduction 4/5   Left Hip Flexion 4/5   Left Hip Extension 4/5   Left  Hip External Rotation  4   Left Hip Internal Rotation  4   Left Hip ABduction 4/5   Left Hip ADduction 4/5   Right/Left Knee Right;Left   Right Knee Flexion 5/5   Right Knee Extension 5/5   Left Knee Flexion 5/5   Left Knee Extension 5/5   Right/Left Ankle Right;Left   Right Ankle Dorsiflexion 4-/5   Right Ankle Plantar Flexion 4+/5   Right Ankle Inversion 4/5   Right Ankle Eversion 4/5   Left Ankle Dorsiflexion 4+/5   Left Ankle Plantar Flexion 4+/5   Left Ankle Inversion 4+/5   Left Ankle Eversion 4+/5   Flexibility   Soft Tissue Assessment /Muscle Length yes   Hamstrings 90 degrees bilateral   Palpation   Palpation comment Mild tenderness to palpation over low back near incision site. Incision is well healed   Ambulation/Gait   Ambulation/Gait No   Gait Comments Full gait evaluation deferred   Balance   Balance Assessed No  Deferred   RUE Tone   RUE Tone Within Functional Limits   LUE Tone   LUE Tone Within Functional Limits   RLE Tone   RLE Tone Within Functional Limits   LLE Tone   LLE Tone Within Functional Limits                           PT Education - 12/12/14 1035    Education provided Yes   Education Details HEP   Person(s) Educated Patient   Methods Explanation;Demonstration;Tactile cues;Verbal cues;Handout    Comprehension Verbalized understanding;Returned demonstration;Tactile cues required;Verbal cues required             PT Long Term Goals - 12/12/14 1047    PT LONG TERM GOAL #1   Title Pt will demonstrate independence with HEP in order to manage low back pain and weakness in order to improve function at home and work by 01/23/15   Status New   PT LONG TERM GOAL #2   Title Pt will demonstrate decrease in modified ODI to below 20% in order to decrease self-reported disability and improve function at home/work by 01/23/15   Status New   PT LONG TERM GOAL #3   Title Pt will report decrease in low back pain at worst to 2/10 on NPRS with aggravating activities in order to demonstrate clinically significant decrease in pain by 01/23/15    Status New               Plan - 12/12/14 1037    Clinical Impression Statement Pt was referred for physical therapy s/p lumbar laminectomy 10/25/14. PT evaluation today reveals decrease in R ankle dorsiflexion, inversion, and eversion strength but improved per patient since prior to surgery. Pt also presents with low back pain which again is improving s/p surgery. Pt states she still has bending, lifting, and twisting precautions as issued by her surgeon. Next follow-up appointment with orthopedics is on 01/17/15. Pt will benefit from skilled PT services to address deficits in low back and abdominal weakness in order to improve spinal stability and decrease pain.    Pt will benefit from skilled therapeutic intervention in order to improve on the following deficits Decreased activity tolerance;Difficulty walking;Decreased strength;Decreased mobility;Pain;Obesity   Rehab Potential Good   Clinical Impairments Affecting Rehab Potential Negative: obesity, chronicity, nerve compression; Positive: motivation, improvement s/p surgery   PT Frequency 2x / week   PT Duration 4 weeks   PT Treatment/Interventions ADLs/Self  Care Home Management;Aquatic  Therapy;Cryotherapy;Electrical Stimulation;Iontophoresis 4mg /ml Dexamethasone;Moist Heat;Ultrasound;Gait training;Stair training;Therapeutic activities;Therapeutic exercise;Balance training;Neuromuscular re-education;Patient/family education;Manual techniques;Scar mobilization   PT Next Visit Plan Reivew ankle inversion, dorsiflexio, and eversion theraband exercises and provide handouts. Progress lumbar stabilization exercises and clarify bending, lifting, twisting restrictions with respect to therapy   PT Home Exercise Plan Hooklying TrA contraction, Hooklying heel slides with TrA contraction, Hooklying TrA contraction with hip fallouts   Consulted and Agree with Plan of Care Patient         Problem List Patient Active Problem List   Diagnosis Date Noted  . Lumbosacral radiculopathy 07/22/2014  . White matter abnormality on MRI of brain 06/12/2014  . Foot drop 06/12/2014  . Right arm weakness 06/12/2014   Phillips Grout PT, DPT   Cheresa Siers 12/12/2014, 11:10 AM  [facility] [address] [place], [address] Phone: [phone]   Fax:  [phone]

## 2014-12-19 ENCOUNTER — Encounter: Payer: Self-pay | Admitting: Physical Therapy

## 2014-12-19 ENCOUNTER — Ambulatory Visit: Payer: No Typology Code available for payment source | Attending: Physician Assistant | Admitting: Physical Therapy

## 2014-12-19 DIAGNOSIS — M545 Low back pain, unspecified: Secondary | ICD-10-CM

## 2014-12-19 DIAGNOSIS — M6281 Muscle weakness (generalized): Secondary | ICD-10-CM | POA: Diagnosis present

## 2014-12-19 DIAGNOSIS — R262 Difficulty in walking, not elsewhere classified: Secondary | ICD-10-CM | POA: Insufficient documentation

## 2014-12-19 NOTE — Therapy (Signed)
[place] [address], [place], [address] Phone: [phone]   Fax:  [phone]  Physical Therapy Treatment  Patient Details  Name: Teresa Rhodes MRN: [id number] Date of Birth: [birth year] Referring Provider:  Valinda Hoar,*  Encounter Date: 12/19/2014      PT End of Session - 12/19/14 0900    Visit Number 2   Number of Visits 8   Date for PT Re-Evaluation 01/23/15   PT Start Time 0805   PT Stop Time 0846   PT Time Calculation (min) 41 min   Activity Tolerance Patient tolerated treatment well   Behavior During Therapy [facility] for tasks assessed/performed      Past Medical History  Diagnosis Date  . Migraine   . Gallstones   . Seizures 2010-2013    Brain seizures  . Depression     Past Surgical History  Procedure Laterality Date  . Cholecystectomy  2002  . Neck surgery  2005 & 2009    x2    There were no vitals filed for this visit.  Visit Diagnosis:  Bilateral low back pain without sciatica  Muscle weakness (generalized)      Subjective Assessment - 12/19/14 0810    Subjective Patient reports she is now having left sided pain and left LE symptoms today. She works in an [employer] and sits at [employer] and tries to get up frequently. mostly concerned about left leg pain today, feels like pulling in side ans back of leg.    Limitations Sitting;Lifting;Standing;Walking;House hold activities   Patient Stated Goals Pt would like to return back to horseback riding, walking long distances, and riding her motorcycle   Currently in Pain? Yes   Pain Score 5    Pain Location Back   Pain Orientation Left;Lower   Pain Descriptors / Indicators Aching;Constant;Cramping   Pain Type Surgical pain;Acute pain   Pain Onset Today   Pain Frequency Intermittent   Aggravating Factors  sitting, driving   Pain Relieving Factors moving, changing positions and walking   Effect of Pain on Daily  Activities per surgical precautions and pain   Multiple Pain Sites No          [facility] Adult PT Treatment/Exercise - 12/19/14 0851    Exercises   Exercises Other Exercises   Other Exercises  prone lying over 2 pillows with ice applied to back x 10 min. followed by prone on elbows x 2-3 min., followed by standing back extension within comfortable range, educated in correct sitting posture with back support and support under hips with driving to avoid pain in LE's (driving stick shift) with home instructions to perform daily, sitting stabilization with review of TrA contraction with exercises and correct breathing with diaphragm: roll ball under one foot 2 min. then the other x 2 min., hip adduction with glute sets x 10 with pushing heels into floor, hip stabilization in sitting with blue resistive band x 15 reps    Modalities   Modalities Cryotherapy   Cryotherapy   Number Minutes Cryotherapy 10 Minutes   Cryotherapy Location Lumbar Spine   Type of Cryotherapy Ice pack     Patient response to treatment: decreased pain to 0/10 in left LE and demonstrated good knowledge of exercises and pain control strategies following session           PT Education - 12/19/14 0859    Education provided Yes   Education Details home instruction, reviewed proper  breathing  techniques with exercises, posture with lumbar support for sitting and driving to avoid pain   Person(s) Educated Patient   Methods Explanation;Demonstration;Verbal cues   Comprehension Verbalized understanding;Returned demonstration;Verbal cues required;Need further instruction             PT Long Term Goals - 12/12/14 1047    PT LONG TERM GOAL #1   Title Pt will demonstrate independence with HEP in order to manage low back pain and weakness in order to improve function at home and work by 01/23/15   Status New   PT LONG TERM GOAL #2   Title Pt will demonstrate decrease in modified ODI to below 20% in order to decrease  self-reported disability and improve function at home/work by 01/23/15   Status New   PT LONG TERM GOAL #3   Title Pt will report decrease in low back pain at worst to 2/10 on NPRS with aggravating activities in order to demonstrate clinically significant decrease in pain by 01/23/15    Status New               Plan - 12/19/14 0900    Clinical Impression Statement Patient demonstrated decreased pain in left LE to 0/10, better with exercises following verbal cues and repetition. She continues with pain and limitations with all daily activities due to recent surgery and precautions and will benefit from continued physical therapy intervention to achieve goals and return to prior level of function.  Plan: continue 2x/week for pain control, modalities, manual therapy soft tissue mobilization, therapeutic exercises        Problem List Patient Active Problem List   Diagnosis Date Noted  . Lumbosacral radiculopathy 07/22/2014  . White matter abnormality on MRI of brain 06/12/2014  . Foot drop 06/12/2014  . Right arm weakness 06/12/2014    Jomarie Longs PT 12/19/2014, 11:28 PM  [place] [facility] [address], [place], [address] Phone: [phone]   Fax:  [phone]

## 2014-12-23 ENCOUNTER — Ambulatory Visit: Payer: No Typology Code available for payment source | Admitting: Physical Therapy

## 2014-12-23 ENCOUNTER — Encounter: Payer: Self-pay | Admitting: Physical Therapy

## 2014-12-23 DIAGNOSIS — M545 Low back pain, unspecified: Secondary | ICD-10-CM

## 2014-12-23 DIAGNOSIS — M6281 Muscle weakness (generalized): Secondary | ICD-10-CM

## 2014-12-24 NOTE — Therapy (Signed)
[place] [facility] [address], [place], [address] Phone: [phone]   Fax:  [phone]  Physical Therapy Treatment  Patient Details  Name: Teresa Rhodes MRN: [id number] Date of Birth: 1971/01/21 Referring Provider:  Valinda Hoar,*  Encounter Date: 12/23/2014      PT End of Session - 12/23/14 1615    Visit Number 3   Number of Visits 8   Date for PT Re-Evaluation 01/23/15   PT Start Time 8242   PT Stop Time 1615   PT Time Calculation (min) 35 min   Activity Tolerance Patient tolerated treatment well   Behavior During Therapy [facility] for tasks assessed/performed      Past Medical History  Diagnosis Date  . Migraine   . Gallstones   . Seizures 2010-2013    Brain seizures  . Depression     Past Surgical History  Procedure Laterality Date  . Cholecystectomy  2002  . Neck surgery  2005 & 2009    x2    There were no vitals filed for this visit.  Visit Diagnosis:  Muscle weakness (generalized)  Bilateral low back pain without sciatica      Subjective Assessment - 12/23/14 1544    Subjective Patient reports that lying on stomach is helping with lower back and left LE symptoms. She reports she can feel heat coming off of lower back near incision. She is fine today and tight in one spot near incision. She reports her right ankle is weak and she almost tripped a few times within the past few days.    Limitations Sitting;Lifting;Standing;Walking;House hold activities   How long can you sit comfortably? depends on sitting surfaces, better on couch    How long can you walk comfortably? walking on concrete is not good and she can walk on level surfaces much better   Patient Stated Goals Pt would like to return back to horseback riding, walking long distances, and riding her motorcycle   Currently in Pain? Yes   Pain Location Back   Pain Orientation Lower   Pain Descriptors / Indicators Aching   Pain Type Surgical pain;Acute pain   Pain Onset More than a month ago   Pain Frequency Intermittent   Effect of Pain on Daily Activities if up doing a lot she feels tight in lower back.    Multiple Pain Sites No      Palpation: lower back and thoracic spine region: + muscle spasms and mild increased warmth noted at end of session, none following ice application         [facility] Adult PT Treatment/Exercise - 12/23/14 1552    Exercises   Exercises Other Exercises   Other Exercises  prone lying  with ice applied to back x 10 min. followed by prone on elbows x 2-3 min.(at end of session),  sitting stabilization with review of TrA contraction with exercises and correct breathing with diaphragm:  hip adduction with ball between knee and  glute sets x 10, hip stabilization in sitting with blue resistive band x 15 reps, instructed in right ankle resistive exercises with red band for DF/PF, eversion and inversion with assistance of therapist to control movement of leg and isolate ankle, verbal cues to perform correctly    Modalities   Modalities Cryotherapy x 10 min   Cryotherapy   Cryotherapy Location Lumbar Spine/ ice pack       Patient response to treatment: continues with mild tightness in lower  back s/p surgery, improved core control and exercises for strengthening right ankle following demonstration and with minimal verbal cuing           PT Education - 12/23/14 1615    Education provided Yes   Education Details Re assessed home exercises with verbal cuing to perform with correct positioning/posture and breathing   Person(s) Educated Patient   Methods Explanation;Verbal cues   Comprehension Verbalized understanding;Verbal cues required             PT Long Term Goals - 12/12/14 1047    PT LONG TERM GOAL #1   Title Pt will demonstrate independence with HEP in order to manage low back pain and weakness in order to improve function at home and work by 01/23/15   Status New   PT  LONG TERM GOAL #2   Title Pt will demonstrate decrease in modified ODI to below 20% in order to decrease self-reported disability and improve function at home/work by 01/23/15   Status New   PT LONG TERM GOAL #3   Title Pt will report decrease in low back pain at worst to 2/10 on NPRS with aggravating activities in order to demonstrate clinically significant decrease in pain by 01/23/15    Status New               Plan - 12/23/14 1620    Clinical Impression Statement Patient is able to control symptoms into left LE, continues with pressure and spasms in thoracic spine/guarded posture due to healing from surgery. She responded well to ice following exercises with only mild discomfort at end of session. She continues to require verbal cuing and guidance to perform appropriate exercises for core control and strengthening right LE.    Pt will benefit from skilled therapeutic intervention in order to improve on the following deficits Decreased strength;Pain;Increased muscle spasms;Decreased endurance   Rehab Potential Good   PT Frequency 2x / week   PT Duration 4 weeks   PT Next Visit Plan Re assess home exercises given for ankle resistive exercises and core control, pain and spasm control and progress exercises as indicated   PT Home Exercise Plan added ankle DF, PF, inversion and eversion with resistive band        Problem List Patient Active Problem List   Diagnosis Date Noted  . Lumbosacral radiculopathy 07/22/2014  . White matter abnormality on MRI of brain 06/12/2014  . Foot drop 06/12/2014  . Right arm weakness 06/12/2014    Jomarie Longs PT 12/24/2014, 9:12 AM  [place] [facility] [address], [place], [address] Phone: [phone]   Fax:  [phone]

## 2014-12-26 ENCOUNTER — Ambulatory Visit: Payer: No Typology Code available for payment source

## 2014-12-26 DIAGNOSIS — M6281 Muscle weakness (generalized): Secondary | ICD-10-CM

## 2014-12-26 DIAGNOSIS — M545 Low back pain, unspecified: Secondary | ICD-10-CM

## 2014-12-26 DIAGNOSIS — R262 Difficulty in walking, not elsewhere classified: Secondary | ICD-10-CM

## 2014-12-26 DIAGNOSIS — M79604 Pain in right leg: Secondary | ICD-10-CM

## 2014-12-26 NOTE — Therapy (Addendum)
[place] [facility] [address] [place], [address] Phone: [phone]   Fax:  [phone]  Physical Therapy Treatment  Patient Details  Name: Teresa Rhodes MRN: [id number] Date of Birth: 04/09/1971 Referring Provider:  Valinda Hoar,*  Encounter Date: 12/26/2014      PT End of Session - 12/26/14 1323    Visit Number 4   Number of Visits 8   Date for PT Re-Evaluation 01/23/15   PT Start Time 1300   PT Stop Time 1345   PT Time Calculation (min) 45 min   Activity Tolerance Patient tolerated treatment well   Behavior During Therapy [facility] for tasks assessed/performed      Past Medical History  Diagnosis Date  . Migraine   . Gallstones   . Seizures 2010-2013    Brain seizures  . Depression     Past Surgical History  Procedure Laterality Date  . Cholecystectomy  2002  . Neck surgery  2005 & 2009    x2    There were no vitals filed for this visit.  Visit Diagnosis:  Muscle weakness (generalized)  Bilateral low back pain without sciatica  Low back pain radiating to right lower extremity  Difficulty walking      Subjective Assessment - 12/26/14 1304    Subjective Pt denies any episodes of tripping of foot currently. She states that her LLE pain has resolved. Pt struggles with her ankle exercises due to difficulty with form. She is performing her low back exercises without difficulty.    Pertinent History Pt was referred for physical therapy s/p laminectomy 10/25/14. Approximately 1 year ago pt reports she started walking about 5 miles per day for exercise. She started having radicular low back pain that radiated all the way from the back to the R foot. In addition pt reports that she started having R foot weakness and began tripping over her foot. She went to see a podiatrist who told her that her R and L foot looked "different" and she was sent to a neurologist. The neurologist ordered an MRI of  her brain and neck as well as a RLE NCV test. Pt has a family history of Charcot Lelan Pons Tooth disease (brother and father) but NCV ruled her out for CMT. Subsequently they performed a lumbar MRI which found a L5 nerve root compression. She was sent to an orthopedist who tried an epidural injection which helped for about 5 days but symptoms returned. She was scheduled for low back surgery and was placed in an AFO which she only wore for about 5 days prior to her back surgery. She underwent a lumbar laminectomy without fusion. Pt has noticed improvement in her back and RLE pain since the surgery. She reports that her plantarflexion strength has returned and a lot of her dorsiflexion strength has returned. However pt reports that she is still lacking R ankle dorsiflexion strength. Pt has a follow-up appointment with orthopedic surgeon 01/17/15. She has returned back to work on a limited schedule of 4 hours day. Pt was advised not to perform any bending, lifting, and twisting and was provided with an activity table describing activity restrictions after surgery.    Patient Stated Goals Pt would like to return back to horseback riding, walking long distances, and riding her motorcycle   Currently in Pain? Yes   Pain Score 2    Pain Location Back   Pain Orientation Lower;Other (Comment)  central   Pain Descriptors /  Indicators Tightness   Pain Type Chronic pain;Surgical pain   Pain Onset More than a month ago   Multiple Pain Sites No      OBJECTIVE: Prone lying with ice applied to back x 5 min during history and HEP review; prone bent and straight knee hip extension bilateral x 10 each; Sidelying hip abduction x 10 bilateral; Hooklying TrA contraction clams RTB 2 x 10; Hooklying hip adduction with ball between knee andglute sets 2 x 10, Hookling TrA contraction with hip fallouts x 10 bilateral, Hookling TrA contraction with alternating LE lifts x 10 bilateral; Hookling TrA contraction with alternating  bicycle kick (one foot always on table) x 10; partial bridges x 10; right ankle resistive exercises with red band for DF/PF, eversion and inversion 2 x 10 each; Prone on elbows x 2-3 min.(at end of session) with ice pack on back,                          PT Education - 12/26/14 1322    Education provided Yes   Education Details HEP, verbal cues for correct positioning/posture and breathing during exercises. Added hooklying TrA contraction with alternating LE lifts   Person(s) Educated Patient   Methods Explanation;Verbal cues;Tactile cues   Comprehension Verbalized understanding;Returned demonstration;Verbal cues required             PT Long Term Goals - 12/12/14 1047    PT LONG TERM GOAL #1   Title Pt will demonstrate independence with HEP in order to manage low back pain and weakness in order to improve function at home and work by 01/23/15   Status New   PT LONG TERM GOAL #2   Title Pt will demonstrate decrease in modified ODI to below 20% in order to decrease self-reported disability and improve function at home/work by 01/23/15   Status New   PT LONG TERM GOAL #3   Title Pt will report decrease in low back pain at worst to 2/10 on NPRS with aggravating activities in order to demonstrate clinically significant decrease in pain by 01/23/15    Status New               Plan - 12/26/14 1325    Clinical Impression Statement Pt demonstrates decreased low back pain today compared to last time she saw current therapist. LLE pain has now resolved. Pt continues to demonstrate excessive pelvic motion during stabilization exercises. HEP reinforced with patient. Pt encouraged to continue HEP and follow-up as scheduled   PT Next Visit Plan Re assess home exercises given for ankle resistive exercises and core control, pain and spasm control and progress exercises as indicated   PT Home Exercise Plan Continue current HEP, Added hooklying TrA contraction with alternating LE  lifts   Consulted and Agree with Plan of Care Patient        Problem List Patient Active Problem List   Diagnosis Date Noted  . Lumbosacral radiculopathy 07/22/2014  . White matter abnormality on MRI of brain 06/12/2014  . Foot drop 06/12/2014  . Right arm weakness 06/12/2014    Phillips Grout PT, DPT   Mia Milan 12/26/2014, 1:50 PM  [place] [facility] [address] [place], [address] Phone: [phone]   Fax:  [phone]

## 2014-12-30 ENCOUNTER — Encounter: Payer: No Typology Code available for payment source | Admitting: Physical Therapy

## 2014-12-30 ENCOUNTER — Ambulatory Visit: Payer: No Typology Code available for payment source | Admitting: Physical Therapy

## 2015-01-01 ENCOUNTER — Ambulatory Visit: Payer: No Typology Code available for payment source | Admitting: Occupational Therapy

## 2015-01-01 ENCOUNTER — Ambulatory Visit: Payer: No Typology Code available for payment source | Admitting: Physical Therapy

## 2015-01-01 ENCOUNTER — Encounter: Payer: No Typology Code available for payment source | Admitting: Physical Therapy

## 2015-01-06 ENCOUNTER — Encounter: Payer: No Typology Code available for payment source | Admitting: Physical Therapy

## 2015-01-06 ENCOUNTER — Encounter: Payer: Self-pay | Admitting: Physical Therapy

## 2015-01-06 ENCOUNTER — Ambulatory Visit: Payer: No Typology Code available for payment source | Admitting: Physical Therapy

## 2015-01-06 DIAGNOSIS — M545 Low back pain, unspecified: Secondary | ICD-10-CM

## 2015-01-06 DIAGNOSIS — M6281 Muscle weakness (generalized): Secondary | ICD-10-CM

## 2015-01-06 NOTE — Therapy (Signed)
[place] [facility] [address] [place], [address] Phone: [phone]   Fax:  [phone]  Physical Therapy Treatment  Patient Details  Name: Teresa Rhodes MRN: [id number] Date of Birth: 04-Jul-1971 Referring Provider:  Valinda Hoar,*  Encounter Date: 01/06/2015      PT End of Session - 01/06/15 0954    Visit Number 5   Number of Visits 8   Date for PT Re-Evaluation 01/23/15   PT Start Time 3875   PT Stop Time 0944   PT Time Calculation (min) 49 min   Activity Tolerance Patient tolerated treatment well;No increased pain   Behavior During Therapy [facility] for tasks assessed/performed      Past Medical History  Diagnosis Date  . Migraine   . Gallstones   . Seizures 2010-2013    Brain seizures  . Depression     Past Surgical History  Procedure Laterality Date  . Cholecystectomy  2002  . Neck surgery  2005 & 2009    x2    There were no vitals filed for this visit.  Visit Diagnosis:  Muscle weakness (generalized)  Bilateral low back pain without sciatica      Subjective Assessment - 01/06/15 0900    Subjective Patient reports she is improveing slowly with right foot motion everting foot. Left LE pain has resolved since she stopped driving her mustang stick shift car. Today she reports she is tight in her back and feels she overdid it with cleaning this weekend.    Limitations Sitting;Lifting;Standing;Walking;House hold activities   Patient Stated Goals Pt would like to return back to horseback riding, walking long distances, and riding her motorcycle   Currently in Pain? Yes   Pain Score 2    Pain Location Back   Pain Orientation Lower;Mid   Pain Descriptors / Indicators Tightness   Pain Type Chronic pain;Surgical pain   Pain Onset More than a month ago   Pain Frequency Intermittent   Multiple Pain Sites No      Palpation: increased warmth in lower mid back on arrival       [facility]  Adult PT Treatment/Exercise - 01/06/15 0913    Exercises   Exercises Other Exercises   Other Exercises  sitting on edge of treatment table: with feet on blue balance pad, hip adduction with ball with TrA and glute contraction with proper breathing pattern x 15 reps, hip abduction stabilization with green resistive band doubled x 15 reps, sit to stand on balance pad with ball between knees and engaging TrA and gluteal muscles x 15 reps with verbal cues, standing at [employer] cable lat pull downs with neurtral posture and vebal cues to engage TrA stabilize trunk x 15 reps with 10#, seated scapular row with stabilization of trunk 15# x 10 reps, stabilization at wall with partial wall push ups x 5 reps with verbal cuing for correct position of hands and neurtal spine   Modalities   Modalities Cryotherapy   Cryotherapy   Number Minutes Cryotherapy 10 Minutes   Cryotherapy Location Lumbar Spine   Type of Cryotherapy Ice pack     Patient response to treatment: required verbal cuing for all exercises for correct posture/to control core, decreased pain to 0/10 in lower back and decreased warmth to non palpable at end of session           PT Education - 01/06/15 0953    Education provided Yes   Education Details Added to  HEP: standing/sitting lat pull downs, scapular rows with trunk stabilization using green resistive band, sit to stand with ball between knees    Person(s) Educated Patient   Methods Explanation;Demonstration;Verbal cues   Comprehension Verbalized understanding;Returned demonstration;Verbal cues required             PT Long Term Goals - 12/12/14 1047    PT LONG TERM GOAL #1   Title Pt will demonstrate independence with HEP in order to manage low back pain and weakness in order to improve function at home and work by 01/23/15   Status New   PT LONG TERM GOAL #2   Title Pt will demonstrate decrease in modified ODI to below 20% in order to decrease self-reported disability and  improve function at home/work by 01/23/15   Status New   PT LONG TERM GOAL #3   Title Pt will report decrease in low back pain at worst to 2/10 on NPRS with aggravating activities in order to demonstrate clinically significant decrease in pain by 01/23/15    Status New               Plan - 01/06/15 0956    Clinical Impression Statement Patient demonstrated decreased tightness and pain in lower back to 0/10 at end of session, she required verbal cuing and demonstration to be able to perform exercises with correct technique and engaging correct muslces.    Pt will benefit from skilled therapeutic intervention in order to improve on the following deficits Decreased strength;Pain;Increased muscle spasms;Decreased endurance   Rehab Potential Good   PT Frequency 2x / week   PT Duration 4 weeks   PT Treatment/Interventions ADLs/Self Care Home Management;Aquatic Therapy;Cryotherapy;Electrical Stimulation;Iontophoresis 4mg /ml Dexamethasone;Moist Heat;Ultrasound;Gait training;Stair training;Therapeutic activities;Therapeutic exercise;Balance training;Neuromuscular re-education;Patient/family education;Manual techniques;Scar mobilization   PT Next Visit Plan progress/re inforce exercises/ trunk stability and core conrol as she heals from recent surgery   PT Home Exercise Plan Continue current HEP, Added sit to stand, stablization with resistive band exercises        Problem List Patient Active Problem List   Diagnosis Date Noted  . Lumbosacral radiculopathy 07/22/2014  . White matter abnormality on MRI of brain 06/12/2014  . Foot drop 06/12/2014  . Right arm weakness 06/12/2014    Jomarie Longs PT 01/06/2015, 10:00 AM  [place] [facility] [address] [place], [address] Phone: [phone]   Fax:  [phone]

## 2015-01-08 ENCOUNTER — Encounter: Payer: Self-pay | Admitting: Physical Therapy

## 2015-01-08 ENCOUNTER — Encounter: Payer: No Typology Code available for payment source | Admitting: Physical Therapy

## 2015-01-08 ENCOUNTER — Ambulatory Visit: Payer: No Typology Code available for payment source | Admitting: Physical Therapy

## 2015-01-08 DIAGNOSIS — M6281 Muscle weakness (generalized): Secondary | ICD-10-CM

## 2015-01-08 DIAGNOSIS — M545 Low back pain, unspecified: Secondary | ICD-10-CM

## 2015-01-08 NOTE — Therapy (Signed)
[place] [address], [place], [address] Phone: [phone]   Fax:  [phone]  Physical Therapy Treatment  Patient Details  Name: Teresa Rhodes MRN: [id number] Date of Birth: April 20, 1971 Referring Provider:  Valinda Hoar,*  Encounter Date: 01/08/2015      PT End of Session - 01/08/15 1140    Visit Number 6   Number of Visits 8   Date for PT Re-Evaluation 01/23/15   PT Start Time 1324   PT Stop Time 1130   PT Time Calculation (min) 50 min   Activity Tolerance Patient tolerated treatment well;No increased pain   Behavior During Therapy [facility] for tasks assessed/performed      Past Medical History  Diagnosis Date  . Migraine   . Gallstones   . Seizures 2010-2013    Brain seizures  . Depression     Past Surgical History  Procedure Laterality Date  . Cholecystectomy  2002  . Neck surgery  2005 & 2009    x2    There were no vitals filed for this visit.  Visit Diagnosis:  Muscle weakness (generalized)  Bilateral low back pain without sciatica      Subjective Assessment - 01/08/15 1040    Subjective Patient reports she felt better following previous session and then tightened up follwoing therapy session after sitting at a desk x couple of hours. She reports her right LE feels intermittent episodes of lack of coordintation of her foot (still not fully recovered with weakness ) .    Currently in Pain? No/denies   Pain Location Back   Pain Orientation Lower   Pain Descriptors / Indicators Tightness   Pain Type Chronic pain;Surgical pain   Pain Onset More than a month ago   Pain Frequency Intermittent   Multiple Pain Sites No      Palpation: lower back with increased warmth and heat around incision from surguery Strength: decreased right LE ankle eversion/DF      [facility] Adult PT Treatment/Exercise - 01/08/15 1044    Exercises   Exercises Other Exercises   Other Exercises   sitting on edge of treatment table: with feet on blue balance pad, hip adduction with ball with TrA and glute contraction with proper breathing pattern x 15 reps, hip abduction stabilization with green resistive band doubled x 15 reps, sit to stand on balance pad with ball between knees and engaging TrA and gluteal muscles x 15 reps with verbal cues, standing at [employer] cable lat pull downs with neurtral posture and vebal cues to engage TrA stabilize trunk x 15 reps with 10#, seated scapular row with stabilization of trunk 15# x 10 reps, stabilization at wall with partial wall push ups x 5 reps with verbal cuing for correct position of hands and neurtal spine   Modalities   Modalities Cryotherapy;Electrical Stimulation   Cryotherapy   Number Minutes Cryotherapy 20 Minutes   Cryotherapy Location Lumbar Spine   Electrical Stimulation   Electrical Stimulation Location lower lumbar spine bilateral   Electrical Stimulation Parameters high volt continuous mode (4) electrodes bilateral placement lumbar spine   Electrical Stimulation Goals Pain;Other (comment)  mscle spasms      Patient response to treatment: improved core control and less back pain with core exercises at [facility] today as compared to previous session with verbal cues for correct control/posture, decreased warmth and less tenderness and no tightness reported in right lower back following estim. today  PT Education - 01/08/15 1120    Education provided Yes   Education Details verbal cues, demosntration to perform exercises with improved positioning/posture   Person(s) Educated Patient   Methods Explanation;Demonstration;Verbal cues   Comprehension Returned demonstration;Verbal cues required;Verbalized understanding             PT Long Term Goals - 12/12/14 1047    PT LONG TERM GOAL #1   Title Pt will demonstrate independence with HEP in order to manage low back pain and weakness in order to improve function at home and work  by 01/23/15   Status New   PT LONG TERM GOAL #2   Title Pt will demonstrate decrease in modified ODI to below 20% in order to decrease self-reported disability and improve function at home/work by 01/23/15   Status New   PT LONG TERM GOAL #3   Title Pt will report decrease in low back pain at worst to 2/10 on NPRS with aggravating activities in order to demonstrate clinically significant decrease in pain by 01/23/15    Status New               Plan - 01/08/15 1140    Clinical Impression Statement Patient is progressing well with decreased pain and improving strength and core control. She continues with tightness in lower back that responded well to estim. she will benefit from continued physical therapy intervention to achieve independent home program/self management.    Pt will benefit from skilled therapeutic intervention in order to improve on the following deficits Decreased strength;Pain;Increased muscle spasms;Decreased endurance   Rehab Potential Good   PT Frequency 2x / week   PT Duration 4 weeks   PT Treatment/Interventions Electrical Stimulation;Cryotherapy;Therapeutic exercise;Patient/family education;Ultrasound;Moist Heat   PT Next Visit Plan progress/re inforce exercises/ trunk stability and core conrol as she heals from recent surgery        Problem List Patient Active Problem List   Diagnosis Date Noted  . Lumbosacral radiculopathy 07/22/2014  . White matter abnormality on MRI of brain 06/12/2014  . Foot drop 06/12/2014  . Right arm weakness 06/12/2014    Jomarie Longs PT 01/08/2015, 7:16 PM  [place] [facility] [address] [place], [address] Phone: [phone]   Fax:  [phone]

## 2015-01-13 ENCOUNTER — Ambulatory Visit: Payer: Self-pay | Admitting: Neurology

## 2015-01-13 ENCOUNTER — Ambulatory Visit: Payer: No Typology Code available for payment source | Admitting: Physical Therapy

## 2015-01-13 ENCOUNTER — Encounter: Payer: Self-pay | Admitting: Physical Therapy

## 2015-01-13 ENCOUNTER — Ambulatory Visit: Payer: No Typology Code available for payment source | Admitting: Neurology

## 2015-01-13 ENCOUNTER — Encounter: Payer: No Typology Code available for payment source | Admitting: Physical Therapy

## 2015-01-13 DIAGNOSIS — M6281 Muscle weakness (generalized): Secondary | ICD-10-CM

## 2015-01-13 DIAGNOSIS — M545 Low back pain, unspecified: Secondary | ICD-10-CM

## 2015-01-13 NOTE — Therapy (Signed)
[place] [facility] [address] [place], [address] Phone: [phone]   Fax:  [phone]  Physical Therapy Treatment  Patient Details  Name: Teresa Rhodes MRN: [id number] Date of Birth: Jun 24, 1971 Referring Provider:  Valinda Hoar,*  Encounter Date: 01/13/2015      PT End of Session - 01/13/15 1044    Visit Number 7   Number of Visits 8   Date for PT Re-Evaluation 01/23/15   PT Start Time 0945   PT Stop Time 2979   PT Time Calculation (min) 50 min   Activity Tolerance Patient tolerated treatment well;No increased pain   Behavior During Therapy [facility] for tasks assessed/performed      Past Medical History  Diagnosis Date  . Migraine   . Gallstones   . Seizures 2010-2013    Brain seizures  . Depression     Past Surgical History  Procedure Laterality Date  . Cholecystectomy  2002  . Neck surgery  2005 & 2009    x2    There were no vitals filed for this visit.  Visit Diagnosis:  Muscle weakness (generalized)  Bilateral low back pain without sciatica      Subjective Assessment - 01/13/15 0949    Subjective Patient reports she feels she she may have aggravated back pain over the weekend with sweeping, cleaning and bending activites. today she is feeling tight in her lower back on arrival, uncomfortable. Patient reports she had a few hours of relief from lower back tightness with estim. Following previous session.    Limitations Sitting;Lifting;House hold activities   Patient Stated Goals Pt would like to return back to horseback riding, walking long distances, and riding her motorcycle   Currently in Pain? Yes   Pain Score 2    Pain Location Back   Pain Orientation Lower   Pain Descriptors / Indicators Tightness;Aching   Pain Type Chronic pain;Surgical pain   Pain Onset More than a month ago   Pain Frequency Intermittent   Multiple Pain Sites No     Palpation; increased warmth and  tenderness along central and right side lower lumbar spine: decreased as compared to previous session        [facility] Adult PT Treatment/Exercise - 01/13/15 0954    Exercises   Exercises Other Exercises   Other Exercises  sitting on edge of treatment table: with feet on blue balance pad, hip adduction with ball with TrA and glute contraction with proper breathing pattern x 15 reps, hip abduction stabilization with green resistive bandx 15 reps, sit to stand on balance pad with ball between knees and engaging TrA and gluteal muscles x 15 reps with verbal cues, standing at [employer] cable lat pull downs with neurtral posture and vebal cues to engage TrA stabilize trunk x 15 reps with 10#, seated scapular row with stabilization of trunk 15# x 10 reps Quadruped exercises: rock forward and back with TrA contraction, raise LE back x 5 reps with controlled motion, supine lying bridging with ball between knees x 10 reps with core stabilization/TrA contraction, lower trunk rotation with ball under LE's x 10 reps side to side to engage obliques   Modalities   Modalities Cryotherapy;Electrical Stimulation   Cryotherapy   Number Minutes Cryotherapy 15 Minutes   Cryotherapy Location Lumbar Spine   Electrical Stimulation   Electrical Stimulation Location lower lumbar spine bilateral   Electrical Stimulation Parameters high volt continuous mode (4) electrodes bilateral placement lumbar spine prone  lying, ice applied to same   Electrical Stimulation Goals Pain;Other (comment)  mscle spasms       Patient response to treatment: decreased warmth in lower back and decreased tenderness along right side of lower lumbar spine following treatment, patient required guidance, demonstration and verbal cues to perform exercises with good technique, alignment and core control, performance improved with repetition with quadruped exercises and bridging         PT Education - 01/13/15 1020    Education provided Yes    Education Details patient required verbal cued and guidance for correct intensity of exercises and posture during exercises: added quadruped control exercises and bridging to home exercises with written instructions given   Person(s) Educated Patient   Methods Explanation;Verbal cues   Comprehension Verbalized understanding;Returned demonstration;Verbal cues required             PT Long Term Goals - 12/12/14 1047    PT LONG TERM GOAL #1   Title Pt will demonstrate independence with HEP in order to manage low back pain and weakness in order to improve function at home and work by 01/23/15   Status New   PT LONG TERM GOAL #2   Title Pt will demonstrate decrease in modified ODI to below 20% in order to decrease self-reported disability and improve function at home/work by 01/23/15   Status New   PT LONG TERM GOAL #3   Title Pt will report decrease in low back pain at worst to 2/10 on NPRS with aggravating activities in order to demonstrate clinically significant decrease in pain by 01/23/15    Status New               Plan - 01/13/15 1030    Clinical Impression Statement Patient demonstrated improved control with quadruped exercises with repetition and verbal cuing. She continues with increased tightness in lower back that increases with prolonged sitting and decreased with high volt estim/ice. She continues to benefit from physical therapy interveniton to achieve independence with home program and self management.    Pt will benefit from skilled therapeutic intervention in order to improve on the following deficits Decreased strength;Pain;Increased muscle spasms;Decreased endurance   Rehab Potential Good   PT Frequency 2x / week   PT Duration 4 weeks   PT Treatment/Interventions Electrical Stimulation;Cryotherapy;Therapeutic exercise;Patient/family education;Ultrasound;Moist Heat   PT Next Visit Plan progress/re inforce exercises/ trunk stability and core conrol as she heals from  recent surgery   PT Home Exercise Plan add quadruped exercises with continued core control exericses        Problem List Patient Active Problem List   Diagnosis Date Noted  . Lumbosacral radiculopathy 07/22/2014  . White matter abnormality on MRI of brain 06/12/2014  . Foot drop 06/12/2014  . Right arm weakness 06/12/2014    Jomarie Longs PT 01/13/2015, 6:21 PM  [place] [facility] [address], [place], [address] Phone: [phone]   Fax:  [phone]

## 2015-01-15 ENCOUNTER — Ambulatory Visit: Payer: No Typology Code available for payment source | Admitting: Physical Therapy

## 2015-01-15 ENCOUNTER — Encounter: Payer: Self-pay | Admitting: Physical Therapy

## 2015-01-15 ENCOUNTER — Encounter: Payer: No Typology Code available for payment source | Admitting: Physical Therapy

## 2015-01-15 DIAGNOSIS — M545 Low back pain, unspecified: Secondary | ICD-10-CM

## 2015-01-15 DIAGNOSIS — M79604 Pain in right leg: Secondary | ICD-10-CM

## 2015-01-15 DIAGNOSIS — M6281 Muscle weakness (generalized): Secondary | ICD-10-CM

## 2015-01-15 NOTE — Therapy (Signed)
[place] [facility] [address] [place], [address] Phone: [phone]   Fax:  [phone]  Physical Therapy Treatment  Patient Details  Name: MELIANA CANNER MRN: [id number] Date of Birth: [birth year] Referring Provider:  Valinda Hoar,*  Encounter Date: 01/15/2015      PT End of Session - 01/15/15 1131    Visit Number 8   Number of Visits 8   Date for PT Re-Evaluation 01/23/15   PT Start Time 1025   PT Stop Time 1122   PT Time Calculation (min) 57 min   Activity Tolerance Patient tolerated treatment well   Behavior During Therapy [facility] for tasks assessed/performed      Past Medical History  Diagnosis Date  . Migraine   . Gallstones   . Seizures 2010-2013    Brain seizures  . Depression     Past Surgical History  Procedure Laterality Date  . Cholecystectomy  2002  . Neck surgery  2005 & 2009    x2    There were no vitals filed for this visit.  Visit Diagnosis:  Muscle weakness (generalized)  Low back pain radiating to right lower extremity      Subjective Assessment - 01/15/15 1027    Subjective Patient reports she has had a couple of rough days, and she is still having tightness in her back and it feels like a rod is in her back. She is able to exercise without increased back pain  but it still feels tight. She reports that the high volt electrical stimulation seems to be helping to relieve her tightness for a few hours.    Limitations Sitting   Currently in Pain? Yes   Pain Score 2    Pain Location Back   Pain Orientation Lower   Pain Type Chronic pain;Surgical pain   Pain Onset More than a month ago   Multiple Pain Sites No        Objective: palpation: + increased warmth over lower back L3-5 region with increased tenderness to right side as compared to left side Gait: ambulating WNL's with reports of feeling weak in right LE lower leg/ankle still Strength: + weakness right  ankle DF/PF and eversion continues with improvement noted       [facility] Adult PT Treatment/Exercise - 01/15/15 1034    Exercises   Exercises Other Exercises   Other Exercises  supine hooklying hip adduction with ball x 10 reps 3 second holds, hip abduction with resistive band x 15 reps, side lying clam ex. each LE x 10 - 15 reps, prone lying hip extension/stabilization with tactile and verbal cuing to perform through appropriate ROM without rotation of lower spine.  sitting on edge of treatment table: with feet on blue balance pad, hip adduction with ball with TrA and glute contraction with proper breathing pattern x 15 reps, hip abduction stabilization with green resistive band doubled x 15 reps, sit to stand on balance pad with ball between knees and engaging TrA and gluteal muscles x 15 reps with verbal cues, standing at [employer] cable lat pull downs with neutral posture and vebal cues to engage TrA stabilize trunk x 15 reps with 10#, seated scapular row with stabilization of trunk 15# x 10 reps   Modalities   Modalities Cryotherapy;Electrical Stimulation   Cryotherapy   Number Minutes Cryotherapy 20 Minutes   Cryotherapy Location Lumbar Spine   Type of Cryotherapy Ice pack   Electrical Stimulation   Electrical Stimulation  Location lower lumbar spine bilateral   Electrical Stimulation Parameters high volt continuous mode (4) electrodes applied to lumbar spine paraspinal muscles with patient prone lying   Electrical Stimulation Goals Pain;Other (comment)  mscle spasms       Patient response to treatment: decreased warmth and tenerness in lower lumbar spine, continued with tightness in lumbar spine following exercises, required guidance and verbal cues to perform new exercises for stabilization in prone and side lying calm           PT Education - 01/15/15 1130    Education provided Yes   Education Details Patient educated in proper performance of stabilization exercises with new  exercises side lying clam and prone hip extension   Person(s) Educated Patient   Methods Explanation;Verbal cues   Comprehension Verbalized understanding;Returned demonstration;Verbal cues required             PT Long Term Goals - 12/12/14 1047    PT LONG TERM GOAL #1   Title Pt will demonstrate independence with HEP in order to manage low back pain and weakness in order to improve function at home and work by 01/23/15   Status New   PT LONG TERM GOAL #2   Title Pt will demonstrate decrease in modified ODI to below 20% in order to decrease self-reported disability and improve function at home/work by 01/23/15   Status New   PT LONG TERM GOAL #3   Title Pt will report decrease in low back pain at worst to 2/10 on NPRS with aggravating activities in order to demonstrate clinically significant decrease in pain by 01/23/15    Status New               Plan - 01/15/15 1130    Clinical Impression Statement Patient continues with pain and tightness in lower back with prolonged sitting. She continues with increased warmth in lower back, weakness in core and right LE and will require additional physical therapy intervention to achieve goals of return to prior level of funciton.    Pt will benefit from skilled therapeutic intervention in order to improve on the following deficits Decreased strength;Pain;Increased muscle spasms;Decreased endurance   Rehab Potential Good   Clinical Impairments Affecting Rehab Potential Negative: obesity, chronic contidion,  nerve compression; Positive: motivation, improvement s/p surgery   PT Frequency 2x / week   PT Duration 4 weeks   PT Treatment/Interventions Electrical Stimulation;Cryotherapy;Therapeutic exercise;Patient/family education;Ultrasound;Moist Heat   PT Next Visit Plan progress/re inforce exercises/ trunk stability and core conrol as she heals from recent surgery   PT Home Exercise Plan added clam and prone hip extension        Problem  List Patient Active Problem List   Diagnosis Date Noted  . Lumbosacral radiculopathy 07/22/2014  . White matter abnormality on MRI of brain 06/12/2014  . Foot drop 06/12/2014  . Right arm weakness 06/12/2014    Jomarie Longs PT 01/15/2015, 2:47 PM  [place] [facility] [address] [place], [address] Phone: [phone]   Fax:  [phone]

## 2015-01-21 ENCOUNTER — Ambulatory Visit: Payer: No Typology Code available for payment source | Attending: Physician Assistant | Admitting: Physical Therapy

## 2015-01-21 ENCOUNTER — Other Ambulatory Visit: Payer: Self-pay | Admitting: Unknown Physician Specialty

## 2015-01-21 ENCOUNTER — Other Ambulatory Visit: Payer: Self-pay | Admitting: Internal Medicine

## 2015-01-21 DIAGNOSIS — N63 Unspecified lump in unspecified breast: Secondary | ICD-10-CM

## 2015-01-21 DIAGNOSIS — M545 Low back pain, unspecified: Secondary | ICD-10-CM

## 2015-01-21 DIAGNOSIS — M6281 Muscle weakness (generalized): Secondary | ICD-10-CM | POA: Insufficient documentation

## 2015-01-21 NOTE — Therapy (Signed)
[place] [facility] [address] [place], [address] Phone: [phone]   Fax:  [phone]  January 21, 2015   @[email or web address] @  Physical Therapy Discharge Summary  Patient: Teresa Rhodes  MRN: [id number]  Date of Birth: May 20, 1971   Diagnosis: Muscle weakness (generalized)  Bilateral low back pain without sciatica Referring Provider:  Valinda Hoar,*  The above patient had been seen in Physical Therapy 8 times of  8 treatments scheduled with  0  no shows and 1 cancellations.  The treatment consisted of cryotherapy/thermotherapy, manual therapy, therapeutic exercise, electrical stimulation, patient education with written home program given The patient is: Improved since beginning physical therapy  Subjective: Patient arrived in clinic and reported she went to MD last week and was told she did not need to continue with therapy and to work on home program at home. She agrees to discharge from physical therapy at this time. Her pain level is reported as not going above a 2/10 with aggravating activities at this time.   Discharge Findings: AROM: lumbar spine WFL's, Modified Oswestry 8% (goal 20% or less)   Functional Status at Discharge: Independent with self management of home program  Goals:  PT Long Term Goals - 01/21/2015    PT LONG TERM GOAL #1   Title Pt will demonstrate independence with HEP in order to manage low back pain and weakness in order to improve function at home and work by 01/23/15   Status Achieved   PT LONG TERM GOAL #2   Title Pt will demonstrate decrease in modified ODI to below 20% in order to decrease self-reported disability and improve function at home/work by 01/23/15   Status Achieved   PT LONG TERM GOAL #3   Title Pt will report decrease in low back pain at worst to 2/10 on NPRS with aggravating activities in order to demonstrate clinically significant decrease in  pain by 01/23/15    Status Achieved                       Plan - 01/21/15 0855    Clinical Impression Statement Patient arrived to therapy today and she reported that MD told her to continue at home with home exercise program. Re assessed modified oswestry with result of 8% self perceived disability and all goals achieved. Plan: discharge from physical therapy at this time.    Rehab Potential Good   PT Frequency 2x / week   PT Duration 4 weeks   PT Treatment/Interventions Electrical Stimulation;Cryotherapy;Therapeutic exercise;Patient/family education;Ultrasound;Moist Heat      Sincerely,   Aldona Lento, PT   CC @CCLISTRESTNAME @  [place] [facility] [facility] [address] [place], [address] Phone: [phone]   Fax:  [phone]

## 2015-01-23 ENCOUNTER — Ambulatory Visit: Payer: No Typology Code available for payment source | Admitting: Physical Therapy

## 2015-01-27 ENCOUNTER — Ambulatory Visit: Payer: No Typology Code available for payment source | Admitting: Physical Therapy

## 2015-01-29 ENCOUNTER — Encounter: Payer: No Typology Code available for payment source | Admitting: Physical Therapy

## 2015-02-03 ENCOUNTER — Ambulatory Visit
Admission: RE | Admit: 2015-02-03 | Discharge: 2015-02-03 | Disposition: A | Discharge status: Home / Self Care | Payer: No Typology Code available for payment source | Source: Ambulatory Visit | Attending: Unknown Physician Specialty | Admitting: Unknown Physician Specialty

## 2015-02-03 ENCOUNTER — Ambulatory Visit: Payer: No Typology Code available for payment source

## 2015-02-03 ENCOUNTER — Encounter: Payer: No Typology Code available for payment source | Admitting: Physical Therapy

## 2015-02-03 DIAGNOSIS — N63 Unspecified lump in unspecified breast: Secondary | ICD-10-CM

## 2015-02-03 DIAGNOSIS — R928 Other abnormal and inconclusive findings on diagnostic imaging of breast: Secondary | ICD-10-CM | POA: Insufficient documentation

## 2015-02-03 DIAGNOSIS — Z09 Encounter for follow-up examination after completed treatment for conditions other than malignant neoplasm: Secondary | ICD-10-CM | POA: Insufficient documentation

## 2015-02-06 ENCOUNTER — Encounter: Payer: No Typology Code available for payment source | Admitting: Physical Therapy

## 2015-03-10 ENCOUNTER — Other Ambulatory Visit: Payer: Self-pay | Admitting: Physician Assistant

## 2015-03-10 ENCOUNTER — Ambulatory Visit
Admission: RE | Admit: 2015-03-10 | Discharge: 2015-03-10 | Disposition: A | Discharge status: Home / Self Care | Payer: No Typology Code available for payment source | Source: Ambulatory Visit | Attending: Physician Assistant | Admitting: Physician Assistant

## 2015-03-10 DIAGNOSIS — M7989 Other specified soft tissue disorders: Secondary | ICD-10-CM | POA: Diagnosis present

## 2015-03-10 DIAGNOSIS — M79605 Pain in left leg: Secondary | ICD-10-CM | POA: Diagnosis present

## 2015-04-18 ENCOUNTER — Ambulatory Visit: Payer: No Typology Code available for payment source | Attending: Specialist

## 2015-04-18 DIAGNOSIS — R5383 Other fatigue: Secondary | ICD-10-CM | POA: Insufficient documentation

## 2015-04-18 DIAGNOSIS — R5381 Other malaise: Secondary | ICD-10-CM | POA: Diagnosis present

## 2015-04-18 DIAGNOSIS — R0683 Snoring: Secondary | ICD-10-CM | POA: Diagnosis present

## 2015-04-18 DIAGNOSIS — G4761 Periodic limb movement disorder: Secondary | ICD-10-CM | POA: Insufficient documentation

## 2015-07-31 ENCOUNTER — Telehealth: Payer: Self-pay | Admitting: Neurology

## 2015-07-31 NOTE — Telephone Encounter (Signed)
Teresa Rhodes, We saw patient last year. MRI of the brain had white matter lesions suspicious for MS. She had been worked up in the past for MS which was negative and so declined repeat lumbar puncture. We decided to repeat MRI of the brain in a year which is now. Would you call patient and see if she is willing to repeat the MRI of the brain? thanks

## 2015-08-11 ENCOUNTER — Encounter: Payer: Self-pay | Admitting: Emergency Medicine

## 2015-08-11 ENCOUNTER — Emergency Department
Admission: EM | Admit: 2015-08-11 | Discharge: 2015-08-11 | Disposition: A | Discharge status: Home / Self Care | Payer: No Typology Code available for payment source | Attending: Emergency Medicine | Admitting: Emergency Medicine

## 2015-08-11 ENCOUNTER — Emergency Department: Payer: No Typology Code available for payment source

## 2015-08-11 DIAGNOSIS — Z87891 Personal history of nicotine dependence: Secondary | ICD-10-CM | POA: Diagnosis not present

## 2015-08-11 DIAGNOSIS — M545 Low back pain, unspecified: Secondary | ICD-10-CM

## 2015-08-11 DIAGNOSIS — M5431 Sciatica, right side: Secondary | ICD-10-CM | POA: Diagnosis not present

## 2015-08-11 DIAGNOSIS — Z3202 Encounter for pregnancy test, result negative: Secondary | ICD-10-CM | POA: Diagnosis not present

## 2015-08-11 DIAGNOSIS — K5901 Slow transit constipation: Secondary | ICD-10-CM | POA: Insufficient documentation

## 2015-08-11 DIAGNOSIS — G8929 Other chronic pain: Secondary | ICD-10-CM | POA: Insufficient documentation

## 2015-08-11 LAB — URINALYSIS COMPLETE WITH MICROSCOPIC (ARMC ONLY)
BILIRUBIN URINE: NEGATIVE
Bacteria, UA: NONE SEEN
Glucose, UA: NEGATIVE mg/dL
HGB URINE DIPSTICK: NEGATIVE
KETONES UR: NEGATIVE mg/dL
LEUKOCYTES UA: NEGATIVE
NITRITE: NEGATIVE
PH: 7 (ref 5.0–8.0)
Protein, ur: NEGATIVE mg/dL
Specific Gravity, Urine: 1.003 — ABNORMAL LOW (ref 1.005–1.030)
WBC, UA: NONE SEEN WBC/hpf (ref 0–5)

## 2015-08-11 LAB — CBC WITH DIFFERENTIAL/PLATELET
Basophils Absolute: 0 10*3/uL (ref 0–0.1)
Basophils Relative: 0 %
EOS PCT: 0 %
Eosinophils Absolute: 0 10*3/uL (ref 0–0.7)
HCT: 40.8 % (ref 35.0–47.0)
Hemoglobin: 13.8 g/dL (ref 12.0–16.0)
LYMPHS ABS: 1.2 10*3/uL (ref 1.0–3.6)
LYMPHS PCT: 7 %
MCH: 27.9 pg (ref 26.0–34.0)
MCHC: 33.9 g/dL (ref 32.0–36.0)
MCV: 82.3 fL (ref 80.0–100.0)
MONO ABS: 0.1 10*3/uL — AB (ref 0.2–0.9)
MONOS PCT: 1 %
Neutro Abs: 14.9 10*3/uL — ABNORMAL HIGH (ref 1.4–6.5)
Neutrophils Relative %: 92 %
PLATELETS: 243 10*3/uL (ref 150–440)
RBC: 4.95 MIL/uL (ref 3.80–5.20)
RDW: 13.6 % (ref 11.5–14.5)
WBC: 16.2 10*3/uL — ABNORMAL HIGH (ref 3.6–11.0)

## 2015-08-11 LAB — BASIC METABOLIC PANEL
Anion gap: 9 (ref 5–15)
BUN: 12 mg/dL (ref 6–20)
CHLORIDE: 106 mmol/L (ref 101–111)
CO2: 23 mmol/L (ref 22–32)
Calcium: 9.1 mg/dL (ref 8.9–10.3)
Creatinine, Ser: 0.6 mg/dL (ref 0.44–1.00)
GFR calc Af Amer: >60 mL/min (ref >60)
GFR calc non Af Amer: >60 mL/min (ref >60)
GLUCOSE: 134 mg/dL — AB (ref 65–99)
POTASSIUM: 3.8 mmol/L (ref 3.5–5.1)
Sodium: 138 mmol/L (ref 135–145)

## 2015-08-11 LAB — PREGNANCY, URINE: Preg Test, Ur: NEGATIVE

## 2015-08-11 LAB — LIPASE, BLOOD: Lipase: 16 U/L (ref 11–51)

## 2015-08-11 MED ORDER — HYDROMORPHONE HCL 1 MG/ML IJ SOLN
1.0000 mg | Freq: Once | INTRAMUSCULAR | Status: AC
  Administered 2015-08-11: 1 mg via INTRAVENOUS
  Filled 2015-08-11: qty 1

## 2015-08-11 MED ORDER — DIAZEPAM 2 MG PO TABS: 2.0000 mg | ORAL_TABLET | Freq: Three times a day (TID) | ORAL | Status: DC | PRN

## 2015-08-11 MED ORDER — KETOROLAC TROMETHAMINE 30 MG/ML IJ SOLN
30.0000 mg | Freq: Once | INTRAMUSCULAR | Status: AC
  Administered 2015-08-11: 30 mg via INTRAVENOUS
  Filled 2015-08-11: qty 1

## 2015-08-11 MED ORDER — OXYCODONE-ACETAMINOPHEN 5-325 MG PO TABS: 1.0000 | ORAL_TABLET | Freq: Three times a day (TID) | ORAL | Status: DC | PRN

## 2015-08-11 MED ORDER — KETOROLAC TROMETHAMINE 10 MG PO TABS: 10.0000 mg | ORAL_TABLET | Freq: Three times a day (TID) | ORAL | Status: DC

## 2015-08-11 MED ORDER — DIAZEPAM 5 MG/ML IJ SOLN
5.0000 mg | Freq: Once | INTRAMUSCULAR | Status: AC
  Administered 2015-08-11: 5 mg via INTRAVENOUS
  Filled 2015-08-11: qty 2

## 2015-08-11 NOTE — ED Provider Notes (Signed)
[facility] Emergency Department Provider Note ____________________________________________  Time seen: 1525  I have reviewed the triage vital signs and the nursing notes.  HISTORY  Chief Complaint  Back Pain and Hip Pain  HPI Teresa Rhodes is a 45 y.o. female presents to the ED for evaluation of increased right radicular pain to her lumbar spine. She describes onset of pain about 3 weeks ago, and was concerned that there was some connection between her increased low back pain and sciatica, and her decreased bowel movements. 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 She also has a history of slow bowels and intermittent constipation since she was 20. She was recently treated by Dr. Melina Schools with a lumbar surgery in September 2016 to relieve pressure on her L5 nerve root that was causing foot drop. She also reports a recent MRI in October 2016 when she last saw Dr. Rolena Infante citing resolution of scarring and nerve impingement. She describes onset without known injury, trauma, fall, or near-fall about 3 weeks prior. She saw her primary care provider, Dr. Doy Hutching, on Friday, and was placed on a steroid taper pack for symptoms. She notes despite that steroid taper she has noted ongoing and even increased pain to the lumbar spine. Dr. Doy Hutching also evaluated her abdominal pain with an abdominal x-ray.  She assumes the results are normal, as she has not received a call. She was unable to secure a follow-up appointment, so she presents here. She denies any bladder or bowel incontinence, foot drop, or leg weakness. She describes paresthesias that referred from the right lumbar sacral spine down the buttocks and around to the anterior lateral aspect of her right thigh. She describes the symptoms are increased compared to her preop sciatica symptoms. She is not make contact with Dr. Rolena Infante related to reevaluation with the symptoms.  She rates her pain at a 10/10 in triage. In addition to the current steroid taper pack she takes lactulose, cyclobenzaprine, glycerin suppositories, and stool softeners including docusate and MiraLAX. She notes that her last bowel movement was yesterday but she also notes that her oral intake is self-limited due to her plans to lose weight as suggested by Dr. Rolena Infante.  Past Medical History  Diagnosis Date  . Migraine   . Gallstones   . Seizures ([place]) 2010-2013    Brain seizures  . Depression     Patient Active Problem List   Diagnosis Date Noted  . Lumbosacral radiculopathy 07/22/2014  . White matter abnormality on MRI of brain 06/12/2014  . Foot drop 06/12/2014  . Right arm weakness 06/12/2014    Past Surgical History  Procedure Laterality Date  . Cholecystectomy  2002  . Neck surgery  2005 & 2009    x2  . Back surgery      Current Outpatient Rx  Name  Route  Sig  Dispense  Refill  . benzonatate (TESSALON) 100 MG capsule            0   . cephALEXin (KEFLEX) 500 MG capsule   Oral   Take 1 capsule (500 mg total) by mouth 2 (two) times daily. Patient not taking: Reported on 12/12/2014   14 capsule   0   . diazepam (VALIUM) 2 MG tablet   Oral   Take 1 tablet (2 mg total) by mouth every 8 (eight) hours as needed for muscle spasms.   15 tablet   0   . DULoxetine (CYMBALTA)  60 MG capsule   Oral   Take 1 capsule (60 mg total) by mouth daily. Patient not taking: Reported on 12/26/2014   30 capsule   6   . gabapentin (NEURONTIN) 300 MG capsule   Oral   Take 1 capsule (300 mg total) by mouth 3 (three) times daily. May cause drowsiness. Patient not taking: Reported on 12/12/2014   90 capsule   11   . ketorolac (TORADOL) 10 MG tablet   Oral   Take 1 tablet (10 mg total) by mouth every 8 (eight) hours.   15 tablet   0   . oxyCODONE-acetaminophen (ROXICET) 5-325 MG tablet   Oral   Take 1 tablet by mouth every 8 (eight) hours as needed for severe pain.   21 tablet    0   . PROAIR HFA 108 (90 BASE) MCG/ACT inhaler            1   . promethazine (PHENERGAN) 25 MG tablet   Oral   Take 1 tablet (25 mg total) by mouth every 6 (six) hours as needed for nausea or vomiting. Patient not taking: Reported on 12/12/2014   8 tablet   0    Allergies Robaxin  Family History  Problem Relation Age of Onset  . Ovarian cancer Mother   . Ulcerative colitis Mother   . Liver disease Maternal Grandfather   . Kidney disease Father 75  . Kidney disease Brother 35  . Colon cancer Neg Hx   . Colon polyps Neg Hx   . Esophageal cancer Neg Hx     Social History Social History  Substance Use Topics  . Smoking status: Former Smoker -- 0.50 packs/day for 3 years    Types: Cigarettes    Quit date: 10/24/2014  . Smokeless tobacco: Never Used     Comment: pt given handout  . Alcohol Use: No   Review of Systems  Constitutional: Negative for fever. Eyes: Negative for visual changes. ENT: Negative for sore throat. Cardiovascular: Negative for chest pain. Respiratory: Negative for shortness of breath. Gastrointestinal: Negative for vomiting and diarrhea. Reports abdominal pain as above Genitourinary: Negative for dysuria. Musculoskeletal: Positive for back pain. Skin: Negative for rash. Neurological: Negative for headaches, focal weakness or numbness. ____________________________________________  PHYSICAL EXAM:  VITAL SIGNS: ED Triage Vitals  Enc Vitals Group     BP 08/11/15 1428 171/94 mmHg     Pulse Rate 08/11/15 1428 114     Resp 08/11/15 1428 18     Temp --      Temp Source 08/11/15 1428 Oral     SpO2 08/11/15 1428 96 %     Weight 08/11/15 1428 236 lb (107.049 kg)     Height 08/11/15 1428 5\' 9"  (1.753 m)     Head Cir --      Peak Flow --      Pain Score --      Pain Loc --      Pain Edu? --      Excl. in [place]? --    Constitutional: Alert and oriented. Well appearing and in no distress. Head: Normocephalic and atraumatic.      Eyes:  Conjunctivae are normal. PERRL. Normal extraocular movements      Ears: Canals clear. TMs intact bilaterally.   Nose: No congestion/rhinorrhea.   Mouth/Throat: Mucous membranes are moist.   Neck: Supple. No thyromegaly. Hematological/Lymphatic/Immunological: No cervical lymphadenopathy. Cardiovascular: Normal rate, regular rhythm.  Respiratory: Normal respiratory effort. No wheezes/rales/rhonchi. Gastrointestinal: Soft and  nontender. No distention. Musculoskeletal: Normal spinal alignment without midline tenderness, spasm, deformity, step-off. Patient with tenderness to palpation over the right lumbar sacral region. She also with some referred pain to the anterior lateral thigh with palpation along the SI joint on the right. She has a supine straight leg raise that does elicit pain from the lumbar region to the anterior lateral right thigh. Nontender with normal range of motion in all extremities.  Neurologic: Cranial nerves II through XII grossly intact. Normal LE DTRs bilaterally. Normal toe dorsiflexion and foot eversion on exam. Antalgic gait without ataxia. Normal speech and language. No gross focal neurologic deficits are appreciated. Skin:  Skin is warm, dry and intact. No rash noted. Psychiatric: Mood and affect are normal. Patient exhibits appropriate insight and judgment. ____________________________________________    LABS (pertinent positives/negatives) Labs Reviewed  BASIC METABOLIC PANEL - Abnormal; Notable for the following:    Glucose, Bld 134 (*)    All other components within normal limits  CBC WITH DIFFERENTIAL/PLATELET - Abnormal; Notable for the following:    WBC 16.2 (*)    Neutro Abs 14.9 (*)    Monocytes Absolute 0.1 (*)    All other components within normal limits  URINALYSIS COMPLETEWITH MICROSCOPIC ([facility] ONLY) - Abnormal; Notable for the following:    Color, Urine STRAW (*)    APPearance CLEAR (*)    Specific Gravity, Urine 1.003 (*)    Squamous  Epithelial / LPF 0-5 (*)    All other components within normal limits  PREGNANCY, URINE  LIPASE, BLOOD  ____________________________________________   RADIOLOGY ABD 1V IMPRESSION:  Scattered stool and gas throughout colon to rectum. Nonobstructive bowel gas pattern.  I, Ceazia Harb, Dannielle Karvonen, personally viewed and evaluated these images (plain radiographs) as part of my medical decision making, as well as reviewing the written report by the radiologist. ____________________________________________  PROCEDURES  Toradol 30mg  IVP Valium 5 mg IVP Dilaudid 1 mg IVP ____________________________________________  INITIAL IMPRESSION / ASSESSMENT AND PLAN / ED COURSE  Patient with ongoing slow transit bowel without any indication of acute process, impaction, constipation,  or small bowel obstruction. Patient also with acute lumbar pain with right radicular symptoms consistent with her sciatic history. A long discussion is had about her disposition including possibility of a lumbar sacral CT here in the ED. Patient declined and would defer instead to Dr. Rolena Infante for an MRI given her medical history. She agrees with the discussed plan to manage pain acutely, and follow-up with Dr. Rolena Infante as soon as possible. She is discharged with prescriptions for ketorolac, Valium, and oxycodone. She is encouraged to contact Dr. Rolena Infante for further evaluation and possible intervention. She is also referred to Dr. Rayann Heman for ongoing, and chronic GI symptoms. She may also benefit from consultation with the nutritionist for weight loss and meal planning. Patient is encouraged to return to the ED for acutely worsening symptoms including distal paresthesias, foot drop or bladder or bowel incontinence. ____________________________________________  FINAL CLINICAL IMPRESSION(S) / ED DIAGNOSES  Final diagnoses:  Back pain at L4-L5 level  Sciatica of right side  Constipation due to slow transit      [employer], PA-C 08/13/15 0042  Harvest Dark, MD 08/15/15 [phone]

## 2015-08-11 NOTE — ED Notes (Signed)
Pt's urine pregnancy is negative 

## 2015-08-11 NOTE — Discharge Instructions (Signed)
Chronic Back Pain  When back pain lasts longer than 3 months, it is called chronic back pain.People with chronic back pain often go through certain periods that are more intense (flare-ups).  CAUSES Chronic back pain can be caused by wear and tear (degeneration) on different structures in your back. These structures include:  The bones of your spine (vertebrae) and the joints surrounding your spinal cord and nerve roots (facets).  The strong, fibrous tissues that connect your vertebrae (ligaments). Degeneration of these structures may result in pressure on your nerves. This can lead to constant pain. HOME CARE INSTRUCTIONS  Avoid bending, heavy lifting, prolonged sitting, and activities which make the problem worse.  Take brief periods of rest throughout the day to reduce your pain. Lying down or standing usually is better than sitting while you are resting.  Take over-the-counter or prescription medicines only as directed by your caregiver. SEEK IMMEDIATE MEDICAL CARE IF:   You have weakness or numbness in one of your legs or feet.  You have trouble controlling your bladder or bowels.  You have nausea, vomiting, abdominal pain, shortness of breath, or fainting.   This information is not intended to replace advice given to you by your health care provider. Make sure you discuss any questions you have with your health care provider.   Document Released: 08/12/2004 Document Revised: 09/27/2011 Document Reviewed: 12/23/2014 Elsevier Interactive Patient Education 2016 Elsevier Inc.  High-Fiber Diet Fiber, also called dietary fiber, is a type of carbohydrate found in fruits, vegetables, whole grains, and beans. A high-fiber diet can have many health benefits. Your health care provider may recommend a high-fiber diet to help:  Prevent constipation. Fiber can make your bowel movements more regular.  Lower your cholesterol.  Relieve hemorrhoids, uncomplicated diverticulosis, or  irritable bowel syndrome.  Prevent overeating as part of a weight-loss plan.  Prevent heart disease, type 2 diabetes, and certain cancers. WHAT IS MY PLAN? The recommended daily intake of fiber includes:  38 grams for men under age 36.  16 grams for men over age 25.  58 grams for women under age 51.  68 grams for women over age 54. You can get the recommended daily intake of dietary fiber by eating a variety of fruits, vegetables, grains, and beans. Your health care provider may also recommend a fiber supplement if it is not possible to get enough fiber through your diet. WHAT DO I NEED TO KNOW ABOUT A HIGH-FIBER DIET?  Fiber supplements have not been widely studied for their effectiveness, so it is better to get fiber through food sources.  Always check the fiber content on thenutrition facts label of any prepackaged food. Look for foods that contain at least 5 grams of fiber per serving.  Ask your dietitian if you have questions about specific foods that are related to your condition, especially if those foods are not listed in the following section.  Increase your daily fiber consumption gradually. Increasing your intake of dietary fiber too quickly may cause bloating, cramping, or gas.  Drink plenty of water. Water helps you to digest fiber. WHAT FOODS CAN I EAT? Grains Whole-grain breads. Multigrain cereal. Oats and oatmeal. Brown rice. Barley. Bulgur wheat. [place]. Bran muffins. Popcorn. Rye wafer crackers. Vegetables Sweet potatoes. Spinach. Kale. Artichokes. Cabbage. Broccoli. Green peas. Carrots. Squash. Fruits Berries. Pears. Apples. Oranges. Avocados. Prunes and raisins. Dried figs. Meats and Other Protein Sources Navy, kidney, pinto, and soy beans. Split peas. Lentils. Nuts and seeds. Dairy Fiber-fortified yogurt. Beverages Fiber-fortified  soy milk. Fiber-fortified orange juice. Other Fiber bars. The items listed above may not be a complete list of recommended  foods or beverages. Contact your dietitian for more options. WHAT FOODS ARE NOT RECOMMENDED? Grains White bread. Pasta made with refined flour. White rice. Vegetables Fried potatoes. Canned vegetables. Well-cooked vegetables.  Fruits Fruit juice. Cooked, strained fruit. Meats and Other Protein Sources Fatty cuts of meat. Fried Sales executive or fried fish. Dairy Milk. Yogurt. Cream cheese. Sour cream. Beverages Soft drinks. Other Cakes and pastries. Butter and oils. The items listed above may not be a complete list of foods and beverages to avoid. Contact your dietitian for more information. WHAT ARE SOME TIPS FOR INCLUDING HIGH-FIBER FOODS IN MY DIET?  Eat a wide variety of high-fiber foods.  Make sure that half of all grains consumed each day are whole grains.  Replace breads and cereals made from refined flour or white flour with whole-grain breads and cereals.  Replace white rice with brown rice, bulgur wheat, or millet.  Start the day with a breakfast that is high in fiber, such as a cereal that contains at least 5 grams of fiber per serving.  Use beans in place of meat in soups, salads, or pasta.  Eat high-fiber snacks, such as berries, raw vegetables, nuts, or popcorn.   This information is not intended to replace advice given to you by your health care provider. Make sure you discuss any questions you have with your health care provider.   Document Released: 07/05/2005 Document Revised: 07/26/2014 Document Reviewed: 12/18/2013 Elsevier Interactive Patient Education 2016 Elsevier Inc.  Sciatica Sciatica is pain, weakness, numbness, or tingling along your sciatic nerve. The nerve starts in the lower back and runs down the back of each leg. Nerve damage or certain conditions pinch or put pressure on the sciatic nerve. This causes the pain, weakness, and other discomforts of sciatica. HOME CARE   Only take medicine as told by your doctor.  Apply ice to the affected area for  20 minutes. Do this 3-4 times a day for the first 48-72 hours. Then try heat in the same way.  Exercise, stretch, or do your usual activities if these do not make your pain worse.  Go to physical therapy as told by your doctor.  Keep all doctor visits as told.  Do not wear high heels or shoes that are not supportive.  Get a firm mattress if your mattress is too soft to lessen pain and discomfort. GET HELP RIGHT AWAY IF:   You cannot control when you poop (bowel movement) or pee (urinate).  You have more weakness in your lower back, lower belly (pelvis), butt (buttocks), or legs.  You have redness or puffiness (swelling) of your back.  You have a burning feeling when you pee.  You have pain that gets worse when you lie down.  You have pain that wakes you from your sleep.  Your pain is worse than past pain.  Your pain lasts longer than 4 weeks.  You are suddenly losing weight without reason. MAKE SURE YOU:   Understand these instructions.  Will watch this condition.  Will get help right away if you are not doing well or get worse.   This information is not intended to replace advice given to you by your health care provider. Make sure you discuss any questions you have with your health care provider.   Document Released: 04/13/2008 Document Revised: 03/26/2015 Document Reviewed: 11/14/2011 Elsevier Interactive Patient Education [employer].  Take the prescription meds as directed. Follow-up with Dr. Rolena Infante this week. Also, follow-up with Dr. Rayann Heman for ongoing GI symptoms.

## 2015-08-11 NOTE — ED Notes (Signed)
C/o right low back/sciatic nerve pain for approx 3 weeks.  States seen by Dr Doy Hutching on Friday and started on steroids but continued pain.  States only comfortable when lying flat.

## 2015-08-22 ENCOUNTER — Ambulatory Visit: Payer: Self-pay | Admitting: Physician Assistant

## 2015-09-02 NOTE — Pre-Procedure Instructions (Signed)
Teresa Rhodes  09/02/2015      [id number] [address], [place] [address] Phone: [phone] Fax: [phone]    Your procedure is scheduled on Thursday, September 11, 2015  Report to [facility] Admitting at 9:15 A.M.  Call this number if you have problems the morning of surgery:  [phone]   Remember:  Do not eat food or drink liquids after midnight Wednesday, September 10, 2015  Take these medicines the morning of surgery with A SIP OF WATER : topiramate (TOPAMAX), if needed: HYDROcodone-acetaminophen (NORCO/VICODIN), diazepam (VALIUM) for spasms  Stop taking Aspirin, vitamins, fish oil and herbal medications. Do not take any NSAIDs ie: Ibuprofen, Advil, Naproxen or any medication containing Aspirin; stop Friday, September 05, 2015   Do not wear jewelry, make-up or nail polish.  Do not wear lotions, powders, or perfumes.  You may not  wear deodorant.  Do not shave 48 hours prior to surgery.  Men may shave face and neck.  Do not bring valuables to the hospital.  [facility] is not responsible for any belongings or valuables.  Contacts, dentures or bridgework may not be worn into surgery.  Leave your suitcase in the car.  After surgery it may be brought to your room.  For patients admitted to the hospital, discharge time will be determined by your treatment team.  Patients discharged the day of surgery will not be allowed to drive home.   Name and phone number of your driver:   Special instructions: [place] - Preparing for Surgery  Before surgery, you can play an important role.  Because skin is not sterile, your skin needs to be as free of germs as possible.  You can reduce the number of germs on you skin by washing with CHG (chlorahexidine gluconate) soap before surgery.  CHG is an antiseptic cleaner which kills germs and bonds with the skin to continue killing germs even after washing.  Please DO NOT  use if you have an allergy to CHG or antibacterial soaps.  If your skin becomes reddened/irritated stop using the CHG and inform your nurse when you arrive at Short Stay.  Do not shave (including legs and underarms) for at least 48 hours prior to the first CHG shower.  You may shave your face.  Please follow these instructions carefully:   1.  Shower with CHG Soap the night before surgery and the morning of Surgery.  2.  If you choose to wash your hair, wash your hair first as usual with your normal shampoo.  3.  After you shampoo, rinse your hair and body thoroughly to remove the Shampoo.  4.  Use CHG as you would any other liquid soap.  You can apply chg directly  to the skin and wash gently with scrungie or a clean washcloth.  5.  Apply the CHG Soap to your body ONLY FROM THE NECK DOWN.  Do not use on open wounds or open sores.  Avoid contact with your eyes, ears, mouth and genitals (private parts).  Wash genitals (private parts) with your normal soap.  6.  Wash thoroughly, paying special attention to the area where your surgery will be performed.  7.  Thoroughly rinse your body with warm water from the neck down.  8.  DO NOT shower/wash with your normal soap after using and rinsing off the CHG Soap.  9.  Pat yourself dry with a clean towel.  10.  Wear clean pajamas.            11.  Place clean sheets on your bed the night of your first shower and do not sleep with pets.  Day of Surgery  Do not apply any lotions/deodorants the morning of surgery.  Please wear clean clothes to the hospital/surgery center.  Please read over the following fact sheets that you were given. Pain Booklet, Coughing and Deep Breathing, Blood Transfusion Information, MRSA Information and Surgical Site Infection Prevention

## 2015-09-03 ENCOUNTER — Encounter (HOSPITAL_COMMUNITY)
Admission: RE | Admit: 2015-09-03 | Discharge: 2015-09-03 | Disposition: A | Discharge status: Home / Self Care | Payer: No Typology Code available for payment source | Source: Ambulatory Visit | Attending: Orthopedic Surgery | Admitting: Orthopedic Surgery

## 2015-09-03 ENCOUNTER — Encounter (HOSPITAL_COMMUNITY): Payer: Self-pay

## 2015-09-03 DIAGNOSIS — Z87891 Personal history of nicotine dependence: Secondary | ICD-10-CM | POA: Diagnosis not present

## 2015-09-03 DIAGNOSIS — Z0183 Encounter for blood typing: Secondary | ICD-10-CM | POA: Insufficient documentation

## 2015-09-03 DIAGNOSIS — R569 Unspecified convulsions: Secondary | ICD-10-CM | POA: Insufficient documentation

## 2015-09-03 DIAGNOSIS — M5116 Intervertebral disc disorders with radiculopathy, lumbar region: Secondary | ICD-10-CM | POA: Diagnosis not present

## 2015-09-03 DIAGNOSIS — Z01812 Encounter for preprocedural laboratory examination: Secondary | ICD-10-CM | POA: Insufficient documentation

## 2015-09-03 DIAGNOSIS — Z01818 Encounter for other preprocedural examination: Secondary | ICD-10-CM | POA: Diagnosis not present

## 2015-09-03 HISTORY — DX: Other complications of anesthesia, initial encounter: T88.59XA

## 2015-09-03 HISTORY — DX: Adverse effect of unspecified anesthetic, initial encounter: T41.45XA

## 2015-09-03 LAB — TYPE AND SCREEN
ABO/RH(D): O POS
Antibody Screen: NEGATIVE

## 2015-09-03 LAB — CBC
HCT: 41.3 % (ref 36.0–46.0)
Hemoglobin: 13.8 g/dL (ref 12.0–15.0)
MCH: 27.8 pg (ref 26.0–34.0)
MCHC: 33.4 g/dL (ref 30.0–36.0)
MCV: 83.1 fL (ref 78.0–100.0)
PLATELETS: 237 10*3/uL (ref 150–400)
RBC: 4.97 MIL/uL (ref 3.87–5.11)
RDW: 13.2 % (ref 11.5–15.5)
WBC: 8.8 10*3/uL (ref 4.0–10.5)

## 2015-09-03 LAB — BASIC METABOLIC PANEL
Anion gap: 10 (ref 5–15)
BUN: 7 mg/dL (ref 6–20)
CALCIUM: 9.4 mg/dL (ref 8.9–10.3)
CHLORIDE: 108 mmol/L (ref 101–111)
CO2: 22 mmol/L (ref 22–32)
CREATININE: 0.62 mg/dL (ref 0.44–1.00)
GFR calc non Af Amer: >60 mL/min (ref >60)
Glucose, Bld: 102 mg/dL — ABNORMAL HIGH (ref 65–99)
Potassium: 3.9 mmol/L (ref 3.5–5.1)
SODIUM: 140 mmol/L (ref 135–145)

## 2015-09-03 LAB — SURGICAL PCR SCREEN
MRSA, PCR: NEGATIVE
Staphylococcus aureus: NEGATIVE

## 2015-09-03 LAB — HCG, SERUM, QUALITATIVE: PREG SERUM: NEGATIVE

## 2015-09-03 LAB — ABO/RH: ABO/RH(D): O POS

## 2015-09-03 NOTE — Progress Notes (Signed)
PCP is Dr Doy Hutching Denies ever seeing a cardiologist. States she had a stress test done a few years ago due to feeling tired- states it was normal Doesn't remember the Dr who did it. Sleep study done- states she didn't have sleep apnea.

## 2015-09-04 NOTE — Progress Notes (Signed)
Anesthesia Chart Review:  Pt is a 45 year old female scheduled for L4-5 PLIF on 09/11/2015 with Dr. Rolena Infante.   PMH includes:  Seizures. Former smoker. BMI 35.   Anesthesia history: pt reports she woke up during neck surgery.   Preoperative labs reviewed.    EKG 09/03/15: Sinus rhythm with occasional PVCs. Cannot rule out Anterior infarct, age undetermined  Reviewed case with Dr. Veatrice Kells.   If no changes, I anticipate pt can proceed with surgery as scheduled.   Willeen Cass, FNP-BC [facility] Short Stay Surgical Center/Anesthesiology Phone: [phone] 09/04/2015 3:19 PM

## 2015-09-10 MED ORDER — CEFAZOLIN SODIUM-DEXTROSE 2-3 GM-% IV SOLR
2.0000 g | INTRAVENOUS | Status: AC
  Administered 2015-09-11: 2 g via INTRAVENOUS
  Filled 2015-09-10: qty 50

## 2015-09-11 ENCOUNTER — Encounter (HOSPITAL_COMMUNITY)
Admission: RE | Disposition: A | Discharge status: Home / Self Care | Payer: Self-pay | Source: Ambulatory Visit | Attending: Orthopedic Surgery

## 2015-09-11 ENCOUNTER — Inpatient Hospital Stay (HOSPITAL_COMMUNITY)
Admission: RE | Admit: 2015-09-11 | Discharge: 2015-09-14 | DRG: 460 | Disposition: A | Discharge status: Home / Self Care | Payer: No Typology Code available for payment source | Source: Ambulatory Visit | Attending: Orthopedic Surgery | Admitting: Orthopedic Surgery

## 2015-09-11 ENCOUNTER — Inpatient Hospital Stay (HOSPITAL_COMMUNITY): Payer: No Typology Code available for payment source | Admitting: Anesthesiology

## 2015-09-11 ENCOUNTER — Inpatient Hospital Stay (HOSPITAL_COMMUNITY): Payer: No Typology Code available for payment source | Admitting: Emergency Medicine

## 2015-09-11 ENCOUNTER — Inpatient Hospital Stay (HOSPITAL_COMMUNITY): Payer: No Typology Code available for payment source

## 2015-09-11 ENCOUNTER — Encounter (HOSPITAL_COMMUNITY): Payer: Self-pay | Admitting: *Deleted

## 2015-09-11 DIAGNOSIS — F329 Major depressive disorder, single episode, unspecified: Secondary | ICD-10-CM | POA: Diagnosis present

## 2015-09-11 DIAGNOSIS — F17200 Nicotine dependence, unspecified, uncomplicated: Secondary | ICD-10-CM | POA: Diagnosis present

## 2015-09-11 DIAGNOSIS — G43909 Migraine, unspecified, not intractable, without status migrainosus: Secondary | ICD-10-CM | POA: Diagnosis present

## 2015-09-11 DIAGNOSIS — M5126 Other intervertebral disc displacement, lumbar region: Secondary | ICD-10-CM

## 2015-09-11 DIAGNOSIS — K5900 Constipation, unspecified: Secondary | ICD-10-CM | POA: Diagnosis not present

## 2015-09-11 DIAGNOSIS — Z8249 Family history of ischemic heart disease and other diseases of the circulatory system: Secondary | ICD-10-CM | POA: Diagnosis not present

## 2015-09-11 DIAGNOSIS — M5116 Intervertebral disc disorders with radiculopathy, lumbar region: Principal | ICD-10-CM | POA: Diagnosis present

## 2015-09-11 DIAGNOSIS — M21379 Foot drop, unspecified foot: Secondary | ICD-10-CM | POA: Diagnosis present

## 2015-09-11 DIAGNOSIS — M549 Dorsalgia, unspecified: Secondary | ICD-10-CM | POA: Diagnosis present

## 2015-09-11 DIAGNOSIS — Z79899 Other long term (current) drug therapy: Secondary | ICD-10-CM

## 2015-09-11 DIAGNOSIS — Z9889 Other specified postprocedural states: Secondary | ICD-10-CM

## 2015-09-11 DIAGNOSIS — M5441 Lumbago with sciatica, right side: Secondary | ICD-10-CM | POA: Diagnosis present

## 2015-09-11 SURGERY — POSTERIOR LUMBAR FUSION 1 LEVEL
Anesthesia: General | Site: Back

## 2015-09-11 MED ORDER — SODIUM CHLORIDE 0.9% FLUSH
3.0000 mL | Freq: Two times a day (BID) | INTRAVENOUS | Status: DC
  Administered 2015-09-11 – 2015-09-12 (×2): 3 mL via INTRAVENOUS

## 2015-09-11 MED ORDER — MAGNESIUM CITRATE PO SOLN
0.5000 | Freq: Once | ORAL | Status: AC
  Administered 2015-09-12: 0.5 via ORAL
  Filled 2015-09-11: qty 296

## 2015-09-11 MED ORDER — LIDOCAINE HCL (CARDIAC) 20 MG/ML IV SOLN
INTRAVENOUS | Status: DC | PRN
  Administered 2015-09-11: 60 mg via INTRATRACHEAL

## 2015-09-11 MED ORDER — DEXAMETHASONE SODIUM PHOSPHATE 10 MG/ML IJ SOLN
INTRAMUSCULAR | Status: AC
  Filled 2015-09-11: qty 1

## 2015-09-11 MED ORDER — DEXAMETHASONE SODIUM PHOSPHATE 4 MG/ML IJ SOLN
4.0000 mg | Freq: Four times a day (QID) | INTRAMUSCULAR | Status: DC
  Administered 2015-09-11 – 2015-09-12 (×3): 4 mg via INTRAVENOUS
  Filled 2015-09-11 (×3): qty 1

## 2015-09-11 MED ORDER — LIDOCAINE HCL (CARDIAC) 20 MG/ML IV SOLN
INTRAVENOUS | Status: AC
  Filled 2015-09-11: qty 5

## 2015-09-11 MED ORDER — SODIUM CHLORIDE 0.9% FLUSH: 3.0000 mL | INTRAVENOUS | Status: DC | PRN

## 2015-09-11 MED ORDER — LACTATED RINGERS IV SOLN
INTRAVENOUS | Status: DC
  Administered 2015-09-11: 18:00:00 via INTRAVENOUS

## 2015-09-11 MED ORDER — FENTANYL CITRATE (PF) 250 MCG/5ML IJ SOLN
INTRAMUSCULAR | Status: DC | PRN
  Administered 2015-09-11: 150 ug via INTRAVENOUS
  Administered 2015-09-11: 100 ug via INTRAVENOUS

## 2015-09-11 MED ORDER — ARTIFICIAL TEARS OP OINT
TOPICAL_OINTMENT | OPHTHALMIC | Status: AC
  Filled 2015-09-11: qty 3.5

## 2015-09-11 MED ORDER — NEOSTIGMINE METHYLSULFATE 10 MG/10ML IV SOLN
INTRAVENOUS | Status: AC
  Filled 2015-09-11: qty 1

## 2015-09-11 MED ORDER — FENTANYL CITRATE (PF) 250 MCG/5ML IJ SOLN
INTRAMUSCULAR | Status: AC
  Filled 2015-09-11: qty 5

## 2015-09-11 MED ORDER — GLYCOPYRROLATE 0.2 MG/ML IJ SOLN
INTRAMUSCULAR | Status: DC | PRN
  Administered 2015-09-11: 0.2 mg via INTRAVENOUS

## 2015-09-11 MED ORDER — EPHEDRINE SULFATE 50 MG/ML IJ SOLN
INTRAMUSCULAR | Status: AC
  Filled 2015-09-11: qty 1

## 2015-09-11 MED ORDER — MIDAZOLAM HCL 2 MG/2ML IJ SOLN
INTRAMUSCULAR | Status: DC | PRN
  Administered 2015-09-11: 2 mg via INTRAVENOUS

## 2015-09-11 MED ORDER — SODIUM CHLORIDE 0.9 % IV SOLN: 250.0000 mL | INTRAVENOUS | Status: DC

## 2015-09-11 MED ORDER — LACTATED RINGERS IV SOLN
INTRAVENOUS | Status: DC
  Administered 2015-09-11 (×3): via INTRAVENOUS

## 2015-09-11 MED ORDER — HYDROMORPHONE HCL 1 MG/ML IJ SOLN
INTRAMUSCULAR | Status: AC
  Filled 2015-09-11: qty 1

## 2015-09-11 MED ORDER — HYDROMORPHONE HCL 1 MG/ML IJ SOLN
INTRAMUSCULAR | Status: AC
  Administered 2015-09-11: 0.5 mg via INTRAVENOUS
  Filled 2015-09-11: qty 1

## 2015-09-11 MED ORDER — PROPOFOL 10 MG/ML IV BOLUS
INTRAVENOUS | Status: DC | PRN
  Administered 2015-09-11: 140 mg via INTRAVENOUS
  Administered 2015-09-11: 60 mg via INTRAVENOUS

## 2015-09-11 MED ORDER — HYDROMORPHONE HCL 1 MG/ML IJ SOLN
INTRAMUSCULAR | Status: DC | PRN
  Administered 2015-09-11 (×2): 0.5 mg via INTRAVENOUS

## 2015-09-11 MED ORDER — THROMBIN 20000 UNITS EX SOLR
CUTANEOUS | Status: AC
  Filled 2015-09-11: qty 20000

## 2015-09-11 MED ORDER — PROPOFOL 10 MG/ML IV BOLUS
INTRAVENOUS | Status: AC
  Filled 2015-09-11: qty 20

## 2015-09-11 MED ORDER — HYDROMORPHONE HCL 1 MG/ML IJ SOLN
0.2500 mg | INTRAMUSCULAR | Status: DC | PRN
  Administered 2015-09-11 (×4): 0.5 mg via INTRAVENOUS

## 2015-09-11 MED ORDER — BUPIVACAINE-EPINEPHRINE (PF) 0.25% -1:200000 IJ SOLN
INTRAMUSCULAR | Status: AC
  Filled 2015-09-11: qty 30

## 2015-09-11 MED ORDER — MIDAZOLAM HCL 2 MG/2ML IJ SOLN
INTRAMUSCULAR | Status: AC
  Filled 2015-09-11: qty 2

## 2015-09-11 MED ORDER — PROPOFOL 500 MG/50ML IV EMUL
INTRAVENOUS | Status: DC | PRN
  Administered 2015-09-11: 13:00:00 via INTRAVENOUS
  Administered 2015-09-11: 75 ug/kg/min via INTRAVENOUS

## 2015-09-11 MED ORDER — SUCCINYLCHOLINE CHLORIDE 20 MG/ML IJ SOLN
INTRAMUSCULAR | Status: DC | PRN
  Administered 2015-09-11: 100 mg via INTRAVENOUS

## 2015-09-11 MED ORDER — ONDANSETRON HCL 4 MG/2ML IJ SOLN
INTRAMUSCULAR | Status: AC
  Filled 2015-09-11: qty 2

## 2015-09-11 MED ORDER — OXYCODONE HCL 5 MG PO TABS
ORAL_TABLET | ORAL | Status: AC
  Administered 2015-09-11: 10 mg via ORAL
  Filled 2015-09-11: qty 2

## 2015-09-11 MED ORDER — GLYCOPYRROLATE 0.2 MG/ML IJ SOLN
INTRAMUSCULAR | Status: AC
  Filled 2015-09-11: qty 3

## 2015-09-11 MED ORDER — BUPIVACAINE-EPINEPHRINE 0.25% -1:200000 IJ SOLN
INTRAMUSCULAR | Status: DC | PRN
  Administered 2015-09-11: 10 mL

## 2015-09-11 MED ORDER — HEMOSTATIC AGENTS (NO CHARGE) OPTIME
TOPICAL | Status: DC | PRN
  Administered 2015-09-11: 1 via TOPICAL

## 2015-09-11 MED ORDER — DEXAMETHASONE 4 MG PO TABS
4.0000 mg | ORAL_TABLET | Freq: Four times a day (QID) | ORAL | Status: DC
  Filled 2015-09-11: qty 1

## 2015-09-11 MED ORDER — SUCCINYLCHOLINE CHLORIDE 20 MG/ML IJ SOLN
INTRAMUSCULAR | Status: AC
  Filled 2015-09-11: qty 1

## 2015-09-11 MED ORDER — ONDANSETRON HCL 4 MG/2ML IJ SOLN
4.0000 mg | INTRAMUSCULAR | Status: DC | PRN
Start: 2015-09-11 — End: 2015-09-14
  Administered 2015-09-12: 4 mg via INTRAVENOUS
  Filled 2015-09-11: qty 2

## 2015-09-11 MED ORDER — DIAZEPAM 2 MG PO TABS
2.0000 mg | ORAL_TABLET | Freq: Three times a day (TID) | ORAL | Status: DC | PRN
  Administered 2015-09-12 – 2015-09-14 (×7): 2 mg via ORAL
  Filled 2015-09-11 (×8): qty 1

## 2015-09-11 MED ORDER — GLYCOPYRROLATE 0.2 MG/ML IJ SOLN
INTRAMUSCULAR | Status: AC
  Filled 2015-09-11: qty 1

## 2015-09-11 MED ORDER — MORPHINE SULFATE (PF) 2 MG/ML IV SOLN
1.0000 mg | INTRAVENOUS | Status: DC | PRN
  Administered 2015-09-11: 2 mg via INTRAVENOUS
  Administered 2015-09-11: 4 mg via INTRAVENOUS
  Filled 2015-09-11: qty 1
  Filled 2015-09-11: qty 2
  Filled 2015-09-11: qty 1

## 2015-09-11 MED ORDER — OXYCODONE HCL 5 MG PO TABS
10.0000 mg | ORAL_TABLET | ORAL | Status: DC | PRN
  Administered 2015-09-11 – 2015-09-14 (×11): 10 mg via ORAL
  Filled 2015-09-11 (×11): qty 2

## 2015-09-11 MED ORDER — THROMBIN 20000 UNITS EX SOLR
CUTANEOUS | Status: DC | PRN
  Administered 2015-09-11: 12:00:00 via TOPICAL

## 2015-09-11 MED ORDER — 0.9 % SODIUM CHLORIDE (POUR BTL) OPTIME
TOPICAL | Status: DC | PRN
  Administered 2015-09-11 (×2): 1000 mL

## 2015-09-11 MED ORDER — ARTIFICIAL TEARS OP OINT
TOPICAL_OINTMENT | OPHTHALMIC | Status: DC | PRN
  Administered 2015-09-11: 1 via OPHTHALMIC

## 2015-09-11 MED ORDER — EPHEDRINE SULFATE 50 MG/ML IJ SOLN
INTRAMUSCULAR | Status: DC | PRN
  Administered 2015-09-11: 15 mg via INTRAVENOUS

## 2015-09-11 MED ORDER — TOPIRAMATE 25 MG PO TABS
50.0000 mg | ORAL_TABLET | Freq: Two times a day (BID) | ORAL | Status: DC
  Administered 2015-09-11 – 2015-09-14 (×6): 50 mg via ORAL
  Filled 2015-09-11 (×6): qty 2

## 2015-09-11 MED ORDER — MENTHOL 3 MG MT LOZG
1.0000 | LOZENGE | OROMUCOSAL | Status: DC | PRN
Start: 2015-09-11 — End: 2015-09-14

## 2015-09-11 MED ORDER — DEXAMETHASONE SODIUM PHOSPHATE 10 MG/ML IJ SOLN
INTRAMUSCULAR | Status: DC | PRN
  Administered 2015-09-11: 10 mg via INTRAVENOUS

## 2015-09-11 MED ORDER — PHENOL 1.4 % MT LIQD: 1.0000 | OROMUCOSAL | Status: DC | PRN

## 2015-09-11 MED ORDER — FLEET ENEMA 7-19 GM/118ML RE ENEM
1.0000 | ENEMA | Freq: Once | RECTAL | Status: AC
  Administered 2015-09-13: 1 via RECTAL
  Filled 2015-09-11: qty 1

## 2015-09-11 MED ORDER — CEFAZOLIN SODIUM 1-5 GM-% IV SOLN
1.0000 g | Freq: Three times a day (TID) | INTRAVENOUS | Status: AC
  Administered 2015-09-11 – 2015-09-12 (×2): 1 g via INTRAVENOUS
  Filled 2015-09-11 (×3): qty 50

## 2015-09-11 MED ORDER — ONDANSETRON HCL 4 MG/2ML IJ SOLN
INTRAMUSCULAR | Status: DC | PRN
  Administered 2015-09-11 (×2): 4 mg via INTRAVENOUS

## 2015-09-11 SURGICAL SUPPLY — 76 items
BLADE SURG ROTATE 9660 (MISCELLANEOUS) IMPLANT
BUR EGG ELITE 4.0 (BURR) IMPLANT
CAGE TLIF XL 10 SPINE (Cage) ×2 IMPLANT
CANISTER SUCT 3000ML (MISCELLANEOUS) ×2 IMPLANT
CLIP NEUROVISION LG (CLIP) ×2 IMPLANT
CLSR STERI-STRIP ANTIMIC 1/2X4 (GAUZE/BANDAGES/DRESSINGS) ×2 IMPLANT
COVER SURGICAL LIGHT HANDLE (MISCELLANEOUS) ×2 IMPLANT
DRAPE C-ARM 42X72 X-RAY (DRAPES) ×2 IMPLANT
DRAPE C-ARMOR (DRAPES) ×2 IMPLANT
DRAPE POUCH INSTRU U-SHP 10X18 (DRAPES) ×2 IMPLANT
DRAPE SURG 17X23 STRL (DRAPES) ×2 IMPLANT
DRAPE U-SHAPE 47X51 STRL (DRAPES) ×2 IMPLANT
DRSG MEPILEX BORDER 4X4 (GAUZE/BANDAGES/DRESSINGS) ×2 IMPLANT
DRSG MEPILEX BORDER 4X8 (GAUZE/BANDAGES/DRESSINGS) ×2 IMPLANT
DURAPREP 26ML APPLICATOR (WOUND CARE) ×2 IMPLANT
ELECT BLADE 4.0 EZ CLEAN MEGAD (MISCELLANEOUS) ×2
ELECT BLADE 6.5 EXT (BLADE) IMPLANT
ELECT PENCIL ROCKER SW 15FT (MISCELLANEOUS) ×2 IMPLANT
ELECT REM PT RETURN 9FT ADLT (ELECTROSURGICAL) ×2
ELECTRODE BLDE 4.0 EZ CLN MEGD (MISCELLANEOUS) ×1 IMPLANT
ELECTRODE REM PT RTRN 9FT ADLT (ELECTROSURGICAL) ×1 IMPLANT
GLOVE BIOGEL PI IND STRL 8.5 (GLOVE) ×1 IMPLANT
GLOVE BIOGEL PI INDICATOR 8.5 (GLOVE) ×1
GLOVE SS BIOGEL STRL SZ 8.5 (GLOVE) ×1 IMPLANT
GLOVE SUPERSENSE BIOGEL SZ 8.5 (GLOVE) ×1
GOWN STRL REUS W/ TWL LRG LVL3 (GOWN DISPOSABLE) ×1 IMPLANT
GOWN STRL REUS W/TWL 2XL LVL3 (GOWN DISPOSABLE) ×4 IMPLANT
GOWN STRL REUS W/TWL LRG LVL3 (GOWN DISPOSABLE) ×1
GUIDEWIRE NITINOL BEVEL TIP (WIRE) ×2 IMPLANT
KIT BASIN OR (CUSTOM PROCEDURE TRAY) ×2 IMPLANT
KIT POSITION SURG JACKSON T1 (MISCELLANEOUS) IMPLANT
KIT ROOM TURNOVER OR (KITS) ×2 IMPLANT
LIGHT SOURCE ANGLE TIP STR 7FT (MISCELLANEOUS) ×2 IMPLANT
MODULE NVM5 NEXT GEN EMG (NEEDLE) ×2 IMPLANT
NEEDLE 22X1 1/2 (OR ONLY) (NEEDLE) ×2 IMPLANT
NEEDLE I-PASS III (NEEDLE) ×2 IMPLANT
NEEDLE SPNL 18GX3.5 QUINCKE PK (NEEDLE) ×4 IMPLANT
NS IRRIG 1000ML POUR BTL (IV SOLUTION) ×2 IMPLANT
PACK LAMINECTOMY ORTHO (CUSTOM PROCEDURE TRAY) ×2 IMPLANT
PACK UNIVERSAL I (CUSTOM PROCEDURE TRAY) ×2 IMPLANT
PAD ARMBOARD 7.5X6 YLW CONV (MISCELLANEOUS) ×4 IMPLANT
PATTIES SURGICAL .5 X.5 (GAUZE/BANDAGES/DRESSINGS) IMPLANT
PATTIES SURGICAL .5 X1 (DISPOSABLE) ×2 IMPLANT
POSITIONER HEAD PRONE TRACH (MISCELLANEOUS) ×2 IMPLANT
PROBE BALL TIP NVM5 SNG USE (BALLOONS) ×2 IMPLANT
PUTTY DBX 1CC (Putty) ×2 IMPLANT
PUTTY DBX 1CC DEPUY (Putty) ×1 IMPLANT
ROD RELINE MAS TI 5.5X35MM LRD (Rod) ×2 IMPLANT
ROD RELINE MAS TI LORD 5.5X40 (Rod) ×2 IMPLANT
SCREW LOCK RELINE 5.5 TULIP (Screw) ×8 IMPLANT
SCREW RELINE MAS POLY 6.5X40MM (Screw) ×2 IMPLANT
SCREW RELINE RED 6.5X45MM POLY (Screw) ×2 IMPLANT
SCREW SHANK MAS MOD 6.5X40MM (Screw) ×2 IMPLANT
SCREW SHANK RELINE 6.5X45MM 2C (Screw) ×2 IMPLANT
SHEET CONFORM 45LX20WX5H (Bone Implant) ×2 IMPLANT
SPINE TULIP RELINE MOD (Neuro Prosthesis/Implant) ×4 IMPLANT
SPONGE LAP 18X18 X RAY DECT (DISPOSABLE) ×2 IMPLANT
SPONGE LAP 4X18 X RAY DECT (DISPOSABLE) ×4 IMPLANT
SPONGE SURGIFOAM ABS GEL 100 (HEMOSTASIS) ×2 IMPLANT
STRIP CLOSURE SKIN 1/2X4 (GAUZE/BANDAGES/DRESSINGS) ×2 IMPLANT
SURGIFLO W/THROMBIN 8M KIT (HEMOSTASIS) ×2 IMPLANT
SUT BONE WAX W31G (SUTURE) ×2 IMPLANT
SUT MNCRL AB 3-0 PS2 18 (SUTURE) ×4 IMPLANT
SUT VIC AB 0 CT1 27 (SUTURE) ×4
SUT VIC AB 0 CT1 27XBRD ANBCTR (SUTURE) ×4 IMPLANT
SUT VIC AB 1 CT1 18XCR BRD 8 (SUTURE) ×2 IMPLANT
SUT VIC AB 1 CT1 8-18 (SUTURE) ×2
SUT VIC AB 2-0 CT1 18 (SUTURE) ×4 IMPLANT
SYR BULB IRRIGATION 50ML (SYRINGE) ×2 IMPLANT
SYR CONTROL 10ML LL (SYRINGE) ×2 IMPLANT
TOWEL OR 17X24 6PK STRL BLUE (TOWEL DISPOSABLE) ×2 IMPLANT
TOWEL OR 17X26 10 PK STRL BLUE (TOWEL DISPOSABLE) ×2 IMPLANT
TRAY FOLEY CATH 16FRSI W/METER (SET/KITS/TRAYS/PACK) ×2 IMPLANT
TULIP SPINE RELINE MOD (Neuro Prosthesis/Implant) ×2 IMPLANT
WATER STERILE IRR 1000ML POUR (IV SOLUTION) ×2 IMPLANT
YANKAUER SUCT BULB TIP NO VENT (SUCTIONS) ×2 IMPLANT

## 2015-09-11 NOTE — Transfer of Care (Signed)
Immediate Anesthesia Transfer of Care Note  Patient: Teresa Rhodes  Procedure(s) Performed: Procedure(s): POSTERIOR LUMBAR FUSION  TLIF L4-L5 (1 LEVEL) (N/A)  Patient Location: PACU  Anesthesia Type:General  Level of Consciousness: awake  Airway & Oxygen Therapy: Patient Spontanous Breathing and Patient connected to nasal cannula oxygen  Post-op Assessment: Report given to RN and Post -op Vital signs reviewed and stable  Post vital signs: Reviewed and stable  Last Vitals:  Filed Vitals:   09/11/15 0900  BP: 134/88  Pulse: 86  Temp: 36.7 C  Resp: 18    Complications: No apparent anesthesia complications

## 2015-09-11 NOTE — Brief Op Note (Signed)
09/11/2015  3:30 PM  PATIENT:  Teresa Rhodes  45 y.o. 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   POST-OPERATIVE DIAGNOSIS:  recurrnet L4-5 HNP with radicular leg pain  PROCEDURE:  Procedure(s): POSTERIOR LUMBAR FUSION  TLIF L4-L5 (1 LEVEL) (N/A)  SURGEON:  Surgeon(s) and Role:    * Melina Schools, MD - Primary  PHYSICIAN ASSISTANT:   ASSISTANTS: Carmen Mayo   ANESTHESIA:   general  EBL:  Total I/O In: 2000 [I.V.:2000] Out: 605 [Urine:355; Blood:250]  BLOOD ADMINISTERED:none  DRAINS: none   LOCAL MEDICATIONS USED:  MARCAINE     SPECIMEN:  No Specimen  DISPOSITION OF SPECIMEN:  N/A  COUNTS:  YES  TOURNIQUET:  * No tourniquets in log *  DICTATION: .Other Dictation: Dictation Number [id number]  PLAN OF CARE: Admit to inpatient   PATIENT DISPOSITION:  PACU - hemodynamically stable.

## 2015-09-11 NOTE — H&P (Signed)
History of Present Illness (Christina A. Causey; 09/08/2015 8:41 AM) The patient is a 45 year old female who comes in today for a preoperative History and Physical. The patient is scheduled for a TLIF L4-5 to be performed by Dr. Duane Lope D. Rolena Infante, MD at [facility] on 09/11/15 . Please see the hospital record for complete dictated history and physical.  Problem List/Past Medical  Problems Reconciled  Right-sided low back pain with right-sided sciatica, unspecified chronicity (M54.41)  Encounter for care following Laminectomy (Z47.89)  S/P Laminectomy (Z98.890)   Allergies  Robaxin *MUSCULOSKELETAL THERAPY AGENTS*  Facial Redness Allergies Reconciled   Family History  Hypertension  Father.  Social History  Tobacco use  Current every day smoker. 08/20/2014: smoke(d) less than 1/2 pack(s) per day Marital status  married Children  1 Current work status  working full time Former drinker  08/20/2014: In the past drank Living situation  live with spouse  Medication History Norco (5-325MG  Tablet, 1 (one) Oral 1 PO TID PRN, Taken starting 08/22/2015) Active. (has not taken any for 2 days) DiazePAM (2MG  Tablet, Oral) Active. Ketorolac Tromethamine (10MG  Tablet, Oral) Active. Topiramate (50MG  Tablet, Oral) Active. 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 Causey; 09/08/2015 8:44 AM) 09/08/2015 8:42 AM Weight: 236 lb Height: 67.5in Body Surface Area: 2.18 m Body Mass Index: 36.42 kg/m  Temp.: 98.62F  BP: 154/105 (Sitting, Left Arm, Standard)  Physical Exam She returns today for followup. She continues to have a positive right straight leg raise test. She has weakness with 1/5 EHL, tibialis anterior, gastrocnemius on the right side. She is back using  her AFO brace, dysesthesias and numbness in the L5-S1 distribution mostly the L5. Compartments are soft and nontender. No real hip, knee or ankle pain with joint range of motion. Intact peripheral pulses. Compartments soft and nontender. No history of incontinence of bowel or bladder. No shortness of breath or chest pain. Lungs are clear to auscultation. Heart regular rate and rhythm. She is alert and oriented x3. She has horrific back pain with extension, forward flexion and rotation. Well healed midline surgical scar from her previous discectomy.  Her MRI from 08/12/2015 shows mild-to-moderate degenerative disc disease at L5-S1 with her previous right-sided laminar defect. At L4-5, there is mild disc height loss. She has had the previous right hemilaminectomy. There is now a recurrent large disc herniation producing mass effect on the L5 nerve root. There is moderate degenerative disease at L3-4, with no significant stenosis or mass effect.  Assessment & Plan  Current Plans Goal Of Surgery: Discussed that goal of surgery is to reduce pain and improve function and quality of life. Patient is aware that despite all appropriate treatment that there pain and function could be the same, worse, or different. Posterior decompression/Fusion:Risks of surgery include infection, bleeding, nerve damage, death, stroke, paralysis, failure to heal, need for further surgery, ongoing or worse pain, need for further surgery, CSF leak, loss of bowel or bladder, and recurrent disc herniation or stenosis which would necessitate need for further surgery. Non-union, hardware failure, adjacent segment disease and recurrent pain. Hardware breakage, mal-position requiring surgery to correct or remove.  At this point in time, the patient has the recurrent L4-5 right-sided disc herniation with severe right L5 radiculopathy and weakness. Attempt at conservative management are failing and she  is getting progressively worse motor  deficits. At this point, my recommendation is to do a revision decompression, discectomy and then to stabilize the area with a fusion to prevent recurrences. We reviewed the risks which include adjacent segment disease, especially since there is already underlying degenerative problems above and below, need for further surgery, ongoing or worse pain, loss of bowel or bladder control, death, stroke, paralysis, hardware failure. All of her questions were addressed. We will be planning on surgery this week, Thursday.

## 2015-09-11 NOTE — Anesthesia Preprocedure Evaluation (Addendum)
Anesthesia Evaluation  Patient identified by MRN, date of birth, ID band Patient awake    Reviewed: Allergy & Precautions, H&P , NPO status , Patient's Chart, lab work & pertinent test results  Airway Mallampati: III  TM Distance: >3 FB Neck ROM: Full    Dental no notable dental hx. (+) Teeth Intact, Dental Advisory Given   Pulmonary neg pulmonary ROS, former smoker,    Pulmonary exam normal breath sounds clear to auscultation       Cardiovascular negative cardio ROS   Rhythm:Regular Rate:Normal     Neuro/Psych  Headaches, Seizures -, Well Controlled,  Depression    GI/Hepatic negative GI ROS, Neg liver ROS,   Endo/Other  negative endocrine ROS  Renal/GU negative Renal ROS  negative genitourinary   Musculoskeletal   Abdominal   Peds  Hematology negative hematology ROS (+)   Anesthesia Other Findings   Reproductive/Obstetrics negative OB ROS                            Anesthesia Physical Anesthesia Plan  ASA: II  Anesthesia Plan: General   Post-op Pain Management:    Induction: Intravenous  Airway Management Planned: Oral ETT  Additional Equipment:   Intra-op Plan:   Post-operative Plan: Extubation in OR  Informed Consent: I have reviewed the patients History and Physical, chart, labs and discussed the procedure including the risks, benefits and alternatives for the proposed anesthesia with the patient or authorized representative who has indicated his/her understanding and acceptance.   Dental advisory given  Plan Discussed with: CRNA  Anesthesia Plan Comments:         Anesthesia Quick Evaluation

## 2015-09-11 NOTE — Anesthesia Postprocedure Evaluation (Signed)
Anesthesia Post Note  Patient: Teresa Rhodes  Procedure(s) Performed: Procedure(s) (LRB): POSTERIOR LUMBAR FUSION  TLIF L4-L5 (1 LEVEL) (N/A)  Patient location during evaluation: PACU Anesthesia Type: General Level of consciousness: awake and alert Pain management: pain level controlled Vital Signs Assessment: post-procedure vital signs reviewed and stable Respiratory status: spontaneous breathing, nonlabored ventilation, respiratory function stable and patient connected to nasal cannula oxygen Cardiovascular status: blood pressure returned to baseline and stable Postop Assessment: no signs of nausea or vomiting Anesthetic complications: no    Last Vitals:  Filed Vitals:   09/11/15 1545 09/11/15 1600  BP: 137/90 138/92  Pulse: 112 106  Temp:    Resp: 16 21    Last Pain:  Filed Vitals:   09/11/15 1709  PainSc: 10-Worst pain ever    LLE Motor Response: Responds to commands (09/11/15 1700) LLE Sensation: No numbness;No tingling (09/11/15 1700) RLE Motor Response: Responds to commands (09/11/15 1700) RLE Sensation: Numbness;Tingling ("a little in my right leg", baseline per pt report ) (09/11/15 1700)      Renlee Floor,W. EDMOND

## 2015-09-11 NOTE — Op Note (Signed)
Teresa Rhodes, Teresa Rhodes             ACCOUNT NO.:  [id number]  MEDICAL RECORD NO.:  [id number]  LOCATION:  5C09C                        FACILITY:  [facility]  PHYSICIAN:  Delrico Minehart D. Rolena Infante, M.D. DATE OF BIRTH:  1971-07-16  DATE OF PROCEDURE:  09/11/2015 DATE OF DISCHARGE:                              OPERATIVE REPORT   PREOPERATIVE DIAGNOSES:  L4-5 recurrent disk herniation with right L5 radiculopathy and degenerative disk disease with intractable back, buttock, and right radicular leg pain.  POSTOPERATIVE DIAGNOSES:  L4-5 recurrent disk herniation with right L5 radiculopathy and degenerative disk disease with intractable back, buttock, and right radicular leg pain.  OPERATIVE PROCEDURE:  Transforaminal revision lumbar decompression, diskectomy, and transforaminal lumbar interbody fusion.  COMPLICATIONS:  None.  CONDITION:  Stable.  FIRST ASSISTANT:  [facility].  IMPLANT SYSTEM USED:  NuVasive MIS pedicle screw system with 45 mm length screws at L4, and 40 mm length screws at L5, both 6.5 diameter with 35 mm rod on the left side, 40 mm rod on the right.  Size 10 extra long Titan titanium interbody TLIF cage packed with autograft as well as DBX and Conform allograft sheet.  Intraoperative neuromonitoring during the procedure remained normal with no adverse events.  HISTORY:  This is a very pleasant 45 year old woman who had presented with a foot drop and had a lumbar diskectomy in April 2016.  She did well initially and her foot drop improved and then she returned to see me in October after work-related injury.  She had a recurrent disk herniation.  Her foot drop had returned and she had horrific radicular leg pain.  Conservative management had failed to alleviate her symptoms, so we elected to proceed with the aforementioned surgery.  All appropriate risks, benefits, and alternatives were discussed with the patient and consent was obtained.  OPERATIVE NOTE:  The patient was  brought to the operating room, placed supine on the operating table.  After successful induction of general anesthesia, endotracheal intubation, TEDs, SCDs, and Foley were inserted.  She was turned prone onto the Wilson frame and all bony prominences were well padded.  The back was prepped and draped in standard fashion.  Time-out was taken to confirm patient, procedure, and all other pertinent important data.  On the left-hand side, I then identified the lateral border of the L4 and L5 pedicle and marked it.  I infiltrated this area with 0.25% Marcaine and repeated this on the contralateral side.  I made a small transverse longitudinal incision on the left side and then passed a Jamshidi needle down percutaneously to the lateral aspect of the L4 pedicle.  I confirmed satisfactory position in the AP and lateral planes and then advanced the Jamshidi needle using fluoroscopic guidance, and neuro monitoring.  Once I was nearing the medial border of the pedicle, I took a lateral and confirmed that I was just beyond the posterior vertebral body wall and then advanced the pedicle probe into the pedicle and then cannulated the pedicle.  I repeated this procedure at L5.  Once both the left 5 and 4 pedicles were cannulated, I tapped over the guide pin and then placed the appropriate size pedicle screws.  Both pedicle screws were  stimulated directly and there was no evidence of breach neurodiagnostically.  I then went to the right hand side and again identified the lateral border of the 4 and 5 pedicle.  I made an incision here and then performed a standard Wiltse approach down to the deep fascia.  Deep fascia was sharply incised and then I using the same technique I had used on the contralateral side, I placed pedicle screws on this side.  The 4-5 pedicles were placed with the same length as the left hand side, but these were attached to the retracting blades and then setup a retractor.  I then  identified the facet joint of L4-5 and removed the overhanging scar tissue.  I then carefully started dissecting more medially keeping my curette on the bone.  I identified the previous laminotomy site as well as the remaining L4 spinous process.  Once I had this clearly marked out, I then identified the L4 pars.  At this point, I placed my medial retractor and then began my decompression.  Using an osteotome, I removed the remaining portion of the inferior L4 facet complex.  I then began dissecting on the lateral side of the pars resecting this.  Once this was done, I was able to see the posterior lateral aspect of the disk space.  At this point, since I was just superior to the L5 pedicle, I continued to dissect using a Penfield 4, until I could clearly visualize the disk space.  I then began gently resecting the remaining portion of the superior and medial aspect of the L5 facet.  I then gently started dissecting along the medial border, mobilizing the thecal sac and the extensive scar tissue and then performing generous foraminotomy.  At this point, I then went superiorly and once I had the complete pars resected, I could identify the L4 nerve root.  At this point, I had the posterior lateral decompression complete.  I had excellent visualization of the disk.  With the thecal sac protected, I incised the disk and used a combination of pituitary rongeurs, curettes, and Kerrison rongeurs to resect all of the disk material.  Once this was done, I then used my nerve hook and swept it underneath the L5 nerve root towards the central region to remove the fragmented disk material that had herniated out.  I was able to get 3 large fragments removed. At this point, the nerve root was now no longer under tension.  I could pass my [facility] on the undersurface of the nerve root out the foramen and I could pass it along the medial border.  There was still an extensive amount of scar tissue  from her previous diskectomy.  I held off on doing an extensive decompression through this as it would increase my likelihood of a CSF leak or direct neural injury.  With the diskectomy complete and the 4 and 5 nerve roots decompressed, I then measured and placed a size 10 Titan titanium cage, packed with the local bone from the decompression along with DBX.  Prior to putting in there, I did place a sheet of conform along the anterior annulus.  I had excellent position of the intervertebral cage.  I did make sure prior to placing it at the cartilaginous endplate was completely removed.  At this point, I then removed the retracting blades and attached the polyaxial heads.  I then measured for the rod and applied it and then applied the locking caps.  The locking caps were  torqued off according to manufacture's standards.  I repeated that on the contralateral side. I then took final x-rays which demonstrated satisfactory position of the pedicle screws and the intervertebral cage.  I irrigated both wounds copiously with normal saline and made sure hemostasis using bipolar electrocautery and FloSeal.  Once I had hemostasis I then placed a thrombin-soaked Gelfoam patty over the exposed laminotomy site.  I then closed the deep fascia with an interrupted #1 Vicryl suture and then a layer of 0 Vicryl suture, and superficial 2-0 Vicryl suture, and 3-0 Monocryl for the skin.  Steri-Strips, dry dressings were applied and the patient was ultimately extubated and transferred to the PACU without incident.  At the end of the case, all needle and sponge counts were correct.  First assistant was [employer], my PA.     Bertrice Leder D. Rolena Infante, M.D.    DDB/MEDQ  D:  09/11/2015  T:  09/11/2015  Job:  [id number]  cc:   Duane Lope D. Rolena Infante, M.D.'s Office

## 2015-09-11 NOTE — Anesthesia Procedure Notes (Signed)
Procedure Name: Intubation Date/Time: 09/11/2015 11:05 AM Performed by: Collier Bullock Pre-anesthesia Checklist: Patient identified, Emergency Drugs available, Suction available and Patient being monitored Patient Re-evaluated:Patient Re-evaluated prior to inductionOxygen Delivery Method: Circle system utilized Preoxygenation: Pre-oxygenation with 100% oxygen Intubation Type: IV induction Ventilation: Mask ventilation without difficulty Laryngoscope Size: Mac and 3 Grade View: Grade I Tube type: Oral Tube size: 7.0 mm Number of attempts: 1 Airway Equipment and Method: Stylet Secured at: 23 cm Tube secured with: Tape Dental Injury: Teeth and Oropharynx as per pre-operative assessment

## 2015-09-12 ENCOUNTER — Inpatient Hospital Stay (HOSPITAL_COMMUNITY): Payer: No Typology Code available for payment source

## 2015-09-12 DIAGNOSIS — G43909 Migraine, unspecified, not intractable, without status migrainosus: Secondary | ICD-10-CM | POA: Insufficient documentation

## 2015-09-12 DIAGNOSIS — M5137 Other intervertebral disc degeneration, lumbosacral region: Secondary | ICD-10-CM | POA: Insufficient documentation

## 2015-09-12 DIAGNOSIS — M51379 Other intervertebral disc degeneration, lumbosacral region without mention of lumbar back pain or lower extremity pain: Secondary | ICD-10-CM | POA: Insufficient documentation

## 2015-09-12 DIAGNOSIS — Z720 Tobacco use: Secondary | ICD-10-CM

## 2015-09-12 HISTORY — DX: Tobacco use: Z72.0

## 2015-09-12 MED ORDER — DIAZEPAM 5 MG PO TABS
5.0000 mg | ORAL_TABLET | Freq: Three times a day (TID) | ORAL | Status: DC | PRN
Start: 2015-09-12 — End: 2016-11-10

## 2015-09-12 MED ORDER — OXYCODONE-ACETAMINOPHEN 10-325 MG PO TABS: 1.0000 | ORAL_TABLET | ORAL | Status: DC | PRN

## 2015-09-12 MED ORDER — DIPHENHYDRAMINE HCL 50 MG/ML IJ SOLN
INTRAMUSCULAR | Status: AC
  Administered 2015-09-12: 12.5 mg
  Filled 2015-09-12: qty 1

## 2015-09-12 MED ORDER — POLYETHYLENE GLYCOL 3350 17 G PO PACK
17.0000 g | PACK | Freq: Every day | ORAL | Status: DC
  Administered 2015-09-12 – 2015-09-14 (×4): 17 g via ORAL
  Filled 2015-09-12 (×4): qty 1

## 2015-09-12 MED ORDER — ONDANSETRON HCL 4 MG PO TABS: 4.0000 mg | ORAL_TABLET | Freq: Three times a day (TID) | ORAL | Status: DC | PRN

## 2015-09-12 NOTE — Care Management Note (Signed)
Case Management Note  Patient Details  Name: Teresa Rhodes MRN: [id number] Date of Birth: 1970-10-08  Subjective/Objective:                    Action/Plan: Patient was admitted for a PLIF. Lives at home with spouse. Will follow for discharge needs pending PT/OT evals and physician orders.  Expected Discharge Date:                  Expected Discharge Plan:     In-House Referral:     Discharge planning Services     Post Acute Care Choice:    Choice offered to:     DME Arranged:    DME Agency:     HH Arranged:    HH Agency:     Status of Service:  In process, will continue to follow  Medicare Important Message Given:    Date Medicare IM Given:    Medicare IM give by:    Date Additional Medicare IM Given:    Additional Medicare Important Message give by:     If discussed at [place] of Stay Meetings, dates discussed:    Additional Comments:  Rolm Baptise, RN 09/12/2015, 10:47 AM [phone]

## 2015-09-12 NOTE — Progress Notes (Signed)
Patient has red rash on neck and upper chest. Patient states she has not been itching at the site, states that the site is not itching. Assessment otherwise within defined limits. Rapid response nurse assessed patient, recommending notified MD. Paged PA x2. Will continue to monitor closely.

## 2015-09-12 NOTE — Evaluation (Signed)
Occupational Therapy Evaluation Patient Details Name: Teresa Rhodes MRN: [id number] DOB: [birth year] Today's Date: 09/12/2015    History of Present Illness Pt s/p TLIF L4-5.  Pt with hx of R foot drop   Clinical Impression   Pt reports she was independent with ADLs PTA. Currently pt overall min guard-min assist for ADLs and functional mobility. Began safety, ADL, and back education with pt. Pt able to recall 2/3 back precautions at start of session; able to maintain throughout functional activities. Pt planning to d/c home with intermittent supervision from family. Pt would benefit from continued skilled OT to address established goals.     Follow Up Recommendations  No OT follow up;Supervision - Intermittent    Equipment Recommendations  Other (comment) (reacher)    Recommendations for Other Services       Precautions / Restrictions Precautions Precautions: Back Precaution Booklet Issued: Yes (comment) Precaution Comments: Pt able to recall 2/3 back precautions at start of session. Reviewed all precautions with pt. Required Braces or Orthoses: Spinal Brace Spinal Brace: Lumbar corset;Applied in sitting position Restrictions Weight Bearing Restrictions: No      Mobility Bed Mobility Overal bed mobility: Modified Independent             General bed mobility comments: Pt able to demo good log roll technique. HOB flat and use of rails.  Transfers Overall transfer level: Needs assistance Equipment used: Rolling walker (2 wheeled) Transfers: Sit to/from Stand Sit to Stand: Supervision         General transfer comment: Supervision for safety. Good hand placement and technique.     Balance Overall balance assessment: Needs assistance Sitting-balance support: Feet supported;No upper extremity supported Sitting balance-Leahy Scale: Good     Standing balance support: No upper extremity supported;During functional activity Standing balance-Leahy Scale:  Fair Standing balance comment: Able to stand at sink and wash hands without UE support                            ADL Overall ADL's : Needs assistance/impaired Eating/Feeding: Set up;Sitting   Grooming: Min guard;Wash/dry hands;Standing Grooming Details (indicate cue type and reason): Educated on use of 2 cups for oral care. Upper Body Bathing: Supervision/ safety;Sitting   Lower Body Bathing: Minimal assistance;Sit to/from stand   Upper Body Dressing : Supervision/safety;Sitting Upper Body Dressing Details (indicate cue type and reason): Pt able to don/doff back brace sitting EOB. Lower Body Dressing: Minimal assistance;Sit to/from stand Lower Body Dressing Details (indicate cue type and reason): Pt able to cross one foot over opposite knee to pull up socks. Educated on compensatory strategies for LB ADLs.   Toilet Transfer: Min guard;Ambulation;Comfort height toilet;RW   Toileting- Clothing Manipulation and Hygiene: Min guard;Sitting/lateral lean (for toilet hygiene)       Functional mobility during ADLs: Min guard;Rolling walker General ADL Comments: No family present for OT eval. Educated pt on back precautions during functional activities, brace wear schedule, maintaining posture with bathing and dressing.     Vision     Perception     Praxis      Pertinent Vitals/Pain Pain Assessment: 0-10 Pain Score: 7  Pain Location: back Pain Descriptors / Indicators: Aching Pain Intervention(s): Limited activity within patient's tolerance;Monitored during session;RN gave pain meds during session     Hand Dominance     Extremity/Trunk Assessment Upper Extremity Assessment Upper Extremity Assessment: Overall WFL for tasks assessed   Lower Extremity Assessment Lower Extremity Assessment: Defer  to PT evaluation RLE Deficits / Details: no AROM R ankle df   Cervical / Trunk Assessment Cervical / Trunk Assessment: Other exceptions Cervical / Trunk Exceptions: pt  s/p lumbar sx   Communication Communication Communication: No difficulties   Cognition Arousal/Alertness: Awake/alert Behavior During Therapy: WFL for tasks assessed/performed Overall Cognitive Status: Within Functional Limits for tasks assessed       Memory: Decreased recall of precautions             General Comments       Exercises       Shoulder Instructions      Home Living Family/patient expects to be discharged to:: Private residence Living Arrangements: Spouse/significant other;Children Available Help at Discharge: Family;Available PRN/intermittently (husband is a trunk driver, in-laws live close by) Type of Home: House Home Access: Stairs to enter [employer] of Steps: 2 Entrance Stairs-Rails: Right Home Layout: Two level;Able to live on main level with bedroom/bathroom     Bathroom Shower/Tub: Occupational psychologist: Standard Bathroom Accessibility: Yes How Accessible: Accessible via walker Home Equipment: Bedside commode;Walker - 2 wheels;Shower seat - built in          Prior Functioning/Environment Level of Independence: Independent with assistive device(s)        Comments: AFO R foot    OT Diagnosis: Generalized weakness;Acute pain   OT Problem List: Impaired balance (sitting and/or standing);Decreased knowledge of use of DME or AE;Decreased knowledge of precautions;Obesity;Pain   OT Treatment/Interventions: Self-care/ADL training;Energy conservation;DME and/or AE instruction;Patient/family education;Balance training    OT Goals(Current goals can be found in the care plan section) Acute Rehab OT Goals Patient Stated Goal: go home OT Goal Formulation: With patient Time For Goal Achievement: 09/26/15 Potential to Achieve Goals: Good ADL Goals Pt Will Perform Grooming: with modified independence;standing Pt Will Perform Upper Body Bathing: with modified independence;sitting Pt Will Perform Lower Body Bathing: with  modified independence;sit to/from stand Pt Will Perform Tub/Shower Transfer: Shower transfer;with modified independence;ambulating;shower seat;rolling walker Additional ADL Goal #1: Pt will independently verbally recall 3/3 back precautions and maintain throughout ADL activity. Additional ADL Goal #2: Pt will independently don/doff back brace for increased safety with ADLs and functional mobility.  OT Frequency: Min 2X/week   Barriers to D/C:            Co-evaluation              End of Session Equipment Utilized During Treatment: Gait belt;Rolling walker;Back brace Nurse Communication: Mobility status  Activity Tolerance: Patient tolerated treatment well Patient left: in bed;with call bell/phone within reach;with bed alarm set;with nursing/sitter in room   Time: 1334-1355 OT Time Calculation (min): 21 min Charges:  OT General Charges $OT Visit: 1 Procedure OT Evaluation $OT Eval Moderate Complexity: 1 Procedure G-Codes:     Binnie Kand M.S., OTR/L [phone]  09/12/2015, 2:06 PM

## 2015-09-12 NOTE — Evaluation (Signed)
Physical Therapy Evaluation Patient Details Name: Teresa Rhodes MRN: [id number] DOB: 12-Jun-1971 Today's Date: 09/12/2015   History of Present Illness  Pt s/p TLIF L4-5.  Pt with hx of R foot drop  Clinical Impression  Pt admitted with above diagnosis. Pt currently with functional limitations due to the deficits listed below (see PT Problem List).  Pt will benefit from skilled PT to increase their independence and safety with mobility to allow discharge to the venue listed below.  Pt educated on back precautions and given hand out.  Pt with R foot drop and AFO not in room.  Pt able to ambulate with steppage gait and RW.  Husband is going to bring AFO in for tomorrow's session.  No follow up PT needed and has no DME needs.     Follow Up Recommendations No PT follow up    Equipment Recommendations  None recommended by PT    Recommendations for Other Services       Precautions / Restrictions Precautions Precautions: Back Precaution Booklet Issued: Yes (comment) Required Braces or Orthoses: Spinal Brace Spinal Brace: Lumbar corset;Applied in sitting position Restrictions Weight Bearing Restrictions: No      Mobility  Bed Mobility Overal bed mobility: Modified Independent             General bed mobility comments: sit <> supine and rolling with rail with MOD I and good body mechanics  Transfers Overall transfer level: Needs assistance   Transfers: Sit to/from Stand Sit to Stand: Supervision            Ambulation/Gait Ambulation/Gait assistance: Min guard Ambulation Distance (Feet): 130 Feet Assistive device: Rolling walker (2 wheeled) Gait Pattern/deviations: Steppage;Step-through pattern Gait velocity: decreased   General Gait Details: R LE steppage due to pt not having AFO in room  Stairs Stairs: Yes Stairs assistance: Supervision Stair Management: One rail Right;Step to pattern Number of Stairs: 2 (x 2)    Wheelchair Mobility    Modified Rankin  (Stroke Patients Only)       Balance Overall balance assessment: Needs assistance   Sitting balance-Leahy Scale: Good       Standing balance-Leahy Scale: Fair                               Pertinent Vitals/Pain Pain Assessment: 0-10 Pain Score: 4  Pain Descriptors / Indicators: [place] expects to be discharged to:: Private residence Living Arrangements: Spouse/significant other;Children Available Help at Discharge: Family Type of Home: House Home Access: Stairs to enter Entrance Stairs-Rails: Right Entrance Stairs-Number of Steps: 2 Home Layout: Able to live on main level with bedroom/bathroom;Two level Home Equipment: Bedside commode;Walker - 2 wheels      Prior Function Level of Independence: Independent with assistive device(s)         Comments: AFO R foot     Hand Dominance        Extremity/Trunk Assessment   Upper Extremity Assessment: Defer to OT evaluation           Lower Extremity Assessment: Overall WFL for tasks assessed;RLE deficits/detail RLE Deficits / Details: no AROM R ankle df       Communication      Cognition Arousal/Alertness: Awake/alert Behavior During Therapy: WFL for tasks assessed/performed Overall Cognitive Status: Within Functional Limits for tasks assessed  General Comments General comments (skin integrity, edema, etc.): R AFO not in room    Exercises        Assessment/Plan    PT Assessment Patient needs continued PT services  PT Diagnosis Difficulty walking   PT Problem List Decreased mobility;Decreased knowledge of precautions;Decreased knowledge of use of DME  PT Treatment Interventions DME instruction;Gait training;Stair training;Functional mobility training;Therapeutic activities;Therapeutic exercise;Patient/family education;Balance training   PT Goals (Current goals can be found in the Care Plan section) Acute Rehab PT Goals Patient  Stated Goal: go home PT Goal Formulation: With patient/family Time For Goal Achievement: 09/19/15 Potential to Achieve Goals: Good    Frequency Min 5X/week   Barriers to discharge        Co-evaluation               End of Session Equipment Utilized During Treatment: Gait belt;Back brace Activity Tolerance: Patient tolerated treatment well Patient left: in chair;with call bell/phone within reach;with family/visitor present Nurse Communication: Mobility status         Time: [id number] PT Time Calculation (min) (ACUTE ONLY): 24 min   Charges:   PT Evaluation $PT Eval Moderate Complexity: 1 Procedure PT Treatments $Gait Training: 8-22 mins   PT G Codes:        Deshannon Hinchliffe LUBECK 09/12/2015, 12:53 PM

## 2015-09-12 NOTE — Progress Notes (Signed)
Patient's rash on chest, neck appears to be resolving. Redness reduced, resolving on patient's chest. Patient suspects reaction may have been from magnesium citrate. Will continue to monitor closely.

## 2015-09-12 NOTE — Progress Notes (Addendum)
    Subjective: Procedure(s) (LRB): POSTERIOR LUMBAR FUSION  TLIF L4-L5 (1 LEVEL) (N/A) 1 Day Post-Op  Patient reports pain as 4 on 0-10 scale.  Reports decreased leg pain reports incisional back pain   Positive void negativebowel movement Positive flatus Negative chest pain or shortness of breath  Objective: Vital signs in last 24 hours: Temp:  [97.7 F (36.5 C)-98.9 F (37.2 C)] 97.7 F (36.5 C) (02/24 0542) Pulse Rate:  [86-116] 96 (02/24 0542) Resp:  [11-21] 18 (02/24 0542) BP: (106-143)/(52-92) 112/62 mmHg (02/24 0542) SpO2:  [96 %-99 %] 97 % (02/24 0542) Weight:  [107.094 kg (236 lb 1.6 oz)] 107.094 kg (236 lb 1.6 oz) (02/23 0900)  Intake/Output from previous day: 02/23 0701 - 02/24 0700 In: 2200 [I.V.:2200] Out: 3080 [Urine:2830; Blood:250]  Labs: No results for input(s): WBC, RBC, HCT, PLT in the last 72 hours. No results for input(s): NA, K, CL, CO2, BUN, CREATININE, GLUCOSE, CALCIUM in the last 72 hours. No results for input(s): LABPT, INR in the last 72 hours.  Physical Exam: Neurologically intact ABD soft Intact pulses distally Compartment soft Dressings c/d/i  Assessment/Plan: Patient stable  Continue mobilization with physical therapy Continue care  Advance diet Up with therapy  Plan on d/c tomorrow Xray: satisfactory Stable overall No radicular leg pain - Foot drop persists will monitor   Melina Schools, MD [place] [phone]

## 2015-09-13 MED ORDER — ONDANSETRON HCL 4 MG PO TABS
4.0000 mg | ORAL_TABLET | ORAL | Status: DC | PRN
  Administered 2015-09-13: 4 mg via ORAL
  Filled 2015-09-13: qty 1

## 2015-09-13 MED ORDER — FLEET ENEMA 7-19 GM/118ML RE ENEM
1.0000 | ENEMA | Freq: Once | RECTAL | Status: AC
  Administered 2015-09-14: 1 via RECTAL
  Filled 2015-09-13: qty 1

## 2015-09-13 MED ORDER — ONDANSETRON HCL 4 MG/2ML IJ SOLN: 4.0000 mg | INTRAMUSCULAR | Status: DC | PRN

## 2015-09-13 NOTE — Progress Notes (Signed)
Called Ortho on call to advise that the patient had not had a Bowel movement despite numerous interventions. Received a call from Dr. Gladstone Lighter, he gave orders to change the patient to a clear liquid diet and not to discharge the patient until she has had a BM or Dr. Rolena Infante advises otherwise.

## 2015-09-13 NOTE — Progress Notes (Signed)
Physical Therapy Treatment Patient Details Name: Teresa Rhodes MRN: [id number] DOB: [birth year] Today's Date: 09/13/2015    History of Present Illness Pt s/p TLIF L4-5.  Pt with hx of R foot drop    PT Comments    Pt is making steady progress toward goals. Patient safe to D/C from a mobility standpoint based on progression towards goals set on PT eval.   Follow Up Recommendations  No PT follow up     Equipment Recommendations  None recommended by PT       Precautions / Restrictions Precautions Precautions: Back Precaution Comments: Pt able to recall all 3 back precautions. Minimal cues to demo/adhere to them with mobilityi Spinal Brace: Lumbar corset;Applied in sitting position (pt independent with brace don/doff)    Mobility  Bed Mobility Overal bed mobility: Modified Independent             General bed mobility comments: per note yesterday, did not perform today  Transfers Overall transfer level: Modified independent   Transfers: Sit to/from Stand Sit to Stand: Modified independent (Device/Increase time)         General transfer comment: demo'd good/safe technique to/from bed and chair  Ambulation/Gait Ambulation/Gait assistance: Supervision;Min guard Ambulation Distance (Feet): 130 Feet Assistive device: None Gait Pattern/deviations: Step-through pattern;Decreased stride length;Wide base of support   Gait velocity interpretation: Below normal speed for age/gender General Gait Details: pt with right posterior spring leaf orthotic on today with gait. improved foot clearance with ER of hip noted during gait. min guard assist intially due to slight staggering with gait, no loss of balance noted.   Stairs Stairs: Yes Stairs assistance: Supervision Stair Management: One rail Right;Step to pattern;Forwards Number of Stairs: 3 General stair comments: reminder cues on sequencing and to advance hand along rail for stability      Cognition  Arousal/Alertness: Awake/alert Behavior During Therapy: WFL for tasks assessed/performed Overall Cognitive Status: Within Functional Limits for tasks assessed           Pertinent Vitals/Pain Pain Assessment: 0-10 Pain Score: 5  Pain Location: back Pain Descriptors / Indicators: Sore Pain Intervention(s): Monitored during session;Premedicated before session;Limited activity within patient's tolerance     PT Goals (current goals can now be found in the care plan section) Acute Rehab PT Goals Patient Stated Goal: go home PT Goal Formulation: With patient/family Time For Goal Achievement: 09/19/15 Potential to Achieve Goals: Good Progress towards PT goals: Progressing toward goals    Frequency  Min 5X/week    PT Plan Current plan remains appropriate       End of Session Equipment Utilized During Treatment: Gait belt;Back brace Activity Tolerance: Patient tolerated treatment well Patient left: in chair;with call bell/phone within reach;with family/visitor present     Time: [id number] PT Time Calculation (min) (ACUTE ONLY): 20 min  Charges:  $Gait Training: 8-22 mins           Willow Ora 09/13/2015, 9:54 AM   Willow Ora, PTA, CLT Acute Rehab Services [phone] 09/13/2015, 9:55 AM

## 2015-09-13 NOTE — Progress Notes (Signed)
    Subjective: Procedure(s) (LRB): POSTERIOR LUMBAR FUSION  TLIF L4-L5 (1 LEVEL) (N/A) 2 Days Post-Op  Patient reports pain as 2 on 0-10 scale.  Reports decreased leg pain reports incisional back pain   Positive void Negative bowel movement Positive flatus Negative chest pain or shortness of breath  Objective: Vital signs in last 24 hours: Temp:  [97.3 F (36.3 C)-98.2 F (36.8 C)] 97.5 F (36.4 C) (02/25 0558) Pulse Rate:  [78-94] 86 (02/25 0558) Resp:  [16-19] 16 (02/25 0558) BP: (106-129)/(60-72) 120/72 mmHg (02/25 0558) SpO2:  [95 %-99 %] 99 % (02/25 0558)  Intake/Output from previous day:    Labs: No results for input(s): WBC, RBC, HCT, PLT in the last 72 hours. No results for input(s): NA, K, CL, CO2, BUN, CREATININE, GLUCOSE, CALCIUM in the last 72 hours. No results for input(s): LABPT, INR in the last 72 hours.  Physical Exam: ABD soft Intact pulses distally Incision: dressing C/D/I Compartment soft  EHL weakness persists.  Otherwise neuro intact  Assessment/Plan: Patient stable  xrays satisfactory Continue mobilization with physical therapy Continue care  Up with therapy  Enema for constipation Ok for d/c later this morning  Melina Schools, MD [place] [phone]

## 2015-09-13 NOTE — Progress Notes (Signed)
No PT/OT follow up recommended.  CM has placed [facility] number on pt's AVS as no PCP listed in EPIC.  No other CM needs were communicated.

## 2015-09-14 MED ORDER — POLYETHYLENE GLYCOL 3350 17 G PO PACK: 17.0000 g | PACK | Freq: Every day | ORAL | Status: DC

## 2015-09-14 NOTE — Progress Notes (Signed)
   Subjective: 3 Days Post-Op Procedure(s) (LRB): POSTERIOR LUMBAR FUSION  TLIF L4-L5 (1 LEVEL) (N/A) Patient reports pain as mild.   Patient seen in rounds with Dr. Wynelle Link. Patient is feeling better.  Had a small bowel movement yesterday. Patient is ready to go home today.  Objective: Vital signs in last 24 hours: Temp:  [97.7 F (36.5 C)-98.2 F (36.8 C)] 98.1 F (36.7 C) (02/26 0548) Pulse Rate:  [62-96] 73 (02/26 0548) Resp:  [16-18] 18 (02/26 0548) BP: (102-132)/(57-79) 124/74 mmHg (02/26 0548) SpO2:  [96 %-100 %] 97 % (02/26 0548)  Intake/Output from previous day: No intake or output data in the 24 hours ending 09/14/15 0841   Labs: No results for input(s): HGB in the last 72 hours. No results for input(s): WBC, RBC, HCT, PLT in the last 72 hours. No results for input(s): NA, K, CL, CO2, BUN, CREATININE, GLUCOSE, CALCIUM in the last 72 hours. No results for input(s): LABPT, INR in the last 72 hours.  EXAM: General - Patient is Alert, Appropriate and Oriented Extremity - Neurovascular intact Sensation intact distally EHL weakness still present  Dressing - clean, dry Motor Function - intact, moving foot and toes well on exam.   Assessment/Plan: 3 Days Post-Op Procedure(s) (LRB): POSTERIOR LUMBAR FUSION  TLIF L4-L5 (1 LEVEL) (N/A) Procedure(s) (LRB): POSTERIOR LUMBAR FUSION  TLIF L4-L5 (1 LEVEL) (N/A) Past Medical History  Diagnosis Date  . Migraine   . Gallstones   . 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 Depression   . Complication of anesthesia     states she woke up during  neck surgery   Active Problems:   Back pain  Estimated body mass index is 34.85 kg/(m^2) as calculated from the following:   Height as of this encounter: 5\' 9"  (1.753 m).   Weight as of this encounter: 107.094 kg (236 lb 1.6 oz). Up with therapy Discharge home with home health Diet - Regular diet Follow up - in 2 weeks Activity - WBAT, up ad lib, encourage  ambulation Disposition - Home Condition Upon Discharge - Stable D/C Meds - See DC Summary  Arlee Muslim, PA-C Orthopaedic Surgery 09/14/2015, 8:41 AM

## 2015-09-14 NOTE — Progress Notes (Signed)
Discharge orders received, Pt for discharge home today. IV d/c'd. D/c instructions and RX given with verbalized understanding. Family at bedside to assist patient with discharge. Staff bought pt downstairs via wheelchair. 09/14/15 0915

## 2015-09-16 NOTE — Discharge Summary (Signed)
Physician Discharge Summary  Patient ID: Teresa Rhodes MRN: [id number] DOB/AGE: [birth year] 45 y.o.  Admit date: 09/11/2015 Discharge date: 09/16/2015  Admission Diagnoses:  Lumbar Degenerative Disc Disease   Discharge Diagnoses:  Active Problems:   Back pain   Past Medical History  Diagnosis Date  . Migraine   . Gallstones   . Seizures ([place]) 2010-2013    Brain seizures  . Depression   . Complication of anesthesia     states she woke up during  neck surgery    Surgeries: Procedure(s): POSTERIOR LUMBAR FUSION  TLIF L4-L5 (1 LEVEL) on 09/11/2015   Consultants (if any):    Discharged Condition: Improved  Hospital Course: Teresa Rhodes is an 45 y.o. female who was admitted 09/11/2015 with a diagnosis of Lumbar Degenerative Disc Disease and went to the operating room on 09/11/2015 and underwent the above named procedures.  Pt discharged on 09/14/15.  She was given perioperative antibiotics:  Anti-infectives    Start     Dose/Rate Route Frequency Ordered Stop   09/11/15 1900  ceFAZolin (ANCEF) IVPB 1 g/50 mL premix     1 g 100 mL/hr over 30 Minutes Intravenous Every 8 hours 09/11/15 1742 09/12/15 0651   09/11/15 1100  ceFAZolin (ANCEF) IVPB 2 g/50 mL premix     2 g 100 mL/hr over 30 Minutes Intravenous To [facility] 09/10/15 0957 09/11/15 1125    .  She was given sequential compression devices, early ambulation, and TED for DVT prophylaxis.  She benefited maximally from the hospital stay and there were no complications.    Recent vital signs:  Filed Vitals:   09/14/15 0113 09/14/15 0548  BP: 102/69 124/74  Pulse: 62 73  Temp: 97.9 F (36.6 C) 98.1 F (36.7 C)  Resp: 18 18    Recent laboratory studies:  Lab Results  Component Value Date   HGB 13.8 09/03/2015   HGB 13.8 08/11/2015   HGB 14.3 11/01/2014   Lab Results  Component Value Date   WBC 8.8 09/03/2015   PLT 237 09/03/2015   No results found for: INR Lab Results  Component Value Date    NA 140 09/03/2015   K 3.9 09/03/2015   CL 108 09/03/2015   CO2 22 09/03/2015   BUN 7 09/03/2015   CREATININE 0.62 09/03/2015   GLUCOSE 102* 09/03/2015    Discharge Medications:     Medication List    STOP taking these medications        HYDROcodone-acetaminophen 5-325 MG tablet  Commonly known as:  NORCO/VICODIN      TAKE these medications        diazepam 5 MG tablet  Commonly known as:  VALIUM  Take 1 tablet (5 mg total) by mouth every 8 (eight) hours as needed for anxiety or muscle spasms.     ondansetron 4 MG tablet  Commonly known as:  ZOFRAN  Take 1 tablet (4 mg total) by mouth every 8 (eight) hours as needed for nausea or vomiting.     oxyCODONE-acetaminophen 10-325 MG tablet  Commonly known as:  PERCOCET  Take 1 tablet by mouth every 4 (four) hours as needed for pain.     polyethylene glycol packet  Commonly known as:  MIRALAX / GLYCOLAX  Take 17 g by mouth daily.     topiramate 50 MG tablet  Commonly known as:  TOPAMAX  Take 50 mg by mouth 2 (two) times daily.        Diagnostic Studies: Dg Lumbar  Spine 2-3 Views  09/12/2015  CLINICAL DATA:  Prior lumbar surgery. EXAM: LUMBAR SPINE - 2-3 VIEW COMPARISON:  09/11/2015 . FINDINGS: L4-L5 posterior and interbody fusion. Hardware intact. No acute bony abnormality identified. Diffuse degenerative change. Surgical clips right limping. IMPRESSION: L4-L5 posterior and interbody fusion with good anatomic alignment. Hardware intact. Diffuse degenerative change. No acute bony abnormality. Electronically Signed   By: Marcello Moores  Register   On: 09/12/2015 08:11   Dg Lumbar Spine Complete  09/11/2015  CLINICAL DATA:  Operative portable imaging during L4-L5 lumbar spine fusion. 3 minutes and 4 seconds of fluoro time. EXAM: DG C-ARM 61-120 MIN; LUMBAR SPINE - COMPLETE 4+ VIEW COMPARISON:  None. FINDINGS: Well-positioned bilateral pedicle screws and interconnecting rods fuse L4-L5. A metal intervertebral cage/ spacer partly  maintains the L4-L5 disc height. It is well centered. There is no acute fracture or evidence an operative complication. IMPRESSION: Portable operative imaging during L4-L5 posterior lumbar spine fusion. Electronically Signed   By: Teresa Rhodes M.D.   On: 09/11/2015 15:01   Dg C-arm 61-120 Min  09/11/2015  CLINICAL DATA:  Operative portable imaging during L4-L5 lumbar spine fusion. 3 minutes and 4 seconds of fluoro time. EXAM: DG C-ARM 61-120 MIN; LUMBAR SPINE - COMPLETE 4+ VIEW COMPARISON:  None. FINDINGS: Well-positioned bilateral pedicle screws and interconnecting rods fuse L4-L5. A metal intervertebral cage/ spacer partly maintains the L4-L5 disc height. It is well centered. There is no acute fracture or evidence an operative complication. IMPRESSION: Portable operative imaging during L4-L5 posterior lumbar spine fusion. Electronically Signed   By: Teresa Rhodes M.D.   On: 09/11/2015 15:01    Disposition: 01-Home or Self Care        Follow-up Information    Follow up with Teresa Bailiff, MD In 2 weeks.   Specialty:  Orthopedic Surgery   Why:  If symptoms worsen, For suture removal, For wound re-check   Contact information:   [address] [place] [address] [phone]       Follow up with Teresa Rhodes.   Why:  If you do not have a primary care physician, please this number, follow the prompts and get a new primary care physician.   Contact information:   [phone]       Signed: Valinda Hoar 09/16/2015, 10:55 AM

## 2015-12-04 DIAGNOSIS — K5904 Chronic idiopathic constipation: Secondary | ICD-10-CM | POA: Insufficient documentation

## 2016-05-28 ENCOUNTER — Other Ambulatory Visit
Admission: RE | Admit: 2016-05-28 | Discharge: 2016-05-28 | Disposition: A | Discharge status: Home / Self Care | Payer: Commercial Managed Care - HMO | Source: Ambulatory Visit | Attending: Gastroenterology | Admitting: Gastroenterology

## 2016-05-28 DIAGNOSIS — R1084 Generalized abdominal pain: Secondary | ICD-10-CM | POA: Diagnosis present

## 2016-05-28 DIAGNOSIS — Z862 Personal history of diseases of the blood and blood-forming organs and certain disorders involving the immune mechanism: Secondary | ICD-10-CM | POA: Diagnosis present

## 2016-05-28 DIAGNOSIS — R197 Diarrhea, unspecified: Secondary | ICD-10-CM | POA: Diagnosis present

## 2016-05-28 LAB — GASTROINTESTINAL PANEL BY PCR, STOOL (REPLACES STOOL CULTURE)

## 2016-05-28 LAB — C DIFFICILE QUICK SCREEN W PCR REFLEX
C DIFFICILE (CDIFF) TOXIN: NEGATIVE
C DIFFICLE (CDIFF) ANTIGEN: NEGATIVE
C Diff interpretation: NOT DETECTED

## 2016-08-03 DIAGNOSIS — R454 Irritability and anger: Secondary | ICD-10-CM | POA: Diagnosis not present

## 2016-08-03 DIAGNOSIS — I10 Essential (primary) hypertension: Secondary | ICD-10-CM | POA: Diagnosis not present

## 2016-08-12 DIAGNOSIS — J069 Acute upper respiratory infection, unspecified: Secondary | ICD-10-CM | POA: Diagnosis not present

## 2016-08-12 DIAGNOSIS — R21 Rash and other nonspecific skin eruption: Secondary | ICD-10-CM | POA: Diagnosis not present

## 2016-08-12 DIAGNOSIS — R51 Headache: Secondary | ICD-10-CM | POA: Diagnosis not present

## 2016-09-06 ENCOUNTER — Other Ambulatory Visit: Payer: Self-pay | Admitting: Internal Medicine

## 2016-09-06 DIAGNOSIS — H539 Unspecified visual disturbance: Secondary | ICD-10-CM | POA: Diagnosis not present

## 2016-09-06 DIAGNOSIS — G43809 Other migraine, not intractable, without status migrainosus: Secondary | ICD-10-CM | POA: Diagnosis not present

## 2016-09-06 DIAGNOSIS — R451 Restlessness and agitation: Secondary | ICD-10-CM | POA: Diagnosis not present

## 2016-09-16 ENCOUNTER — Ambulatory Visit
Admission: RE | Admit: 2016-09-16 | Discharge: 2016-09-16 | Disposition: A | Discharge status: Home / Self Care | Payer: Commercial Managed Care - HMO | Source: Ambulatory Visit | Attending: Internal Medicine | Admitting: Internal Medicine

## 2016-09-16 DIAGNOSIS — G43809 Other migraine, not intractable, without status migrainosus: Secondary | ICD-10-CM

## 2016-09-16 DIAGNOSIS — G43909 Migraine, unspecified, not intractable, without status migrainosus: Secondary | ICD-10-CM | POA: Diagnosis not present

## 2016-09-16 DIAGNOSIS — R9089 Other abnormal findings on diagnostic imaging of central nervous system: Secondary | ICD-10-CM | POA: Insufficient documentation

## 2016-09-16 DIAGNOSIS — H539 Unspecified visual disturbance: Secondary | ICD-10-CM | POA: Insufficient documentation

## 2016-10-07 DIAGNOSIS — R51 Headache: Secondary | ICD-10-CM | POA: Diagnosis not present

## 2016-10-07 DIAGNOSIS — R202 Paresthesia of skin: Secondary | ICD-10-CM | POA: Diagnosis not present

## 2016-10-07 DIAGNOSIS — H53133 Sudden visual loss, bilateral: Secondary | ICD-10-CM | POA: Diagnosis not present

## 2016-10-20 DIAGNOSIS — R51 Headache: Secondary | ICD-10-CM

## 2016-10-20 DIAGNOSIS — G479 Sleep disorder, unspecified: Secondary | ICD-10-CM | POA: Diagnosis not present

## 2016-10-20 DIAGNOSIS — R519 Headache, unspecified: Secondary | ICD-10-CM | POA: Insufficient documentation

## 2016-10-20 DIAGNOSIS — E538 Deficiency of other specified B group vitamins: Secondary | ICD-10-CM | POA: Diagnosis not present

## 2016-10-25 DIAGNOSIS — R51 Headache: Secondary | ICD-10-CM | POA: Diagnosis not present

## 2016-10-25 DIAGNOSIS — I10 Essential (primary) hypertension: Secondary | ICD-10-CM | POA: Diagnosis not present

## 2016-10-25 DIAGNOSIS — N951 Menopausal and female climacteric states: Secondary | ICD-10-CM | POA: Diagnosis not present

## 2016-10-29 ENCOUNTER — Other Ambulatory Visit: Payer: Self-pay | Admitting: Neurology

## 2016-10-29 DIAGNOSIS — G35 Multiple sclerosis: Secondary | ICD-10-CM

## 2016-11-02 DIAGNOSIS — R569 Unspecified convulsions: Secondary | ICD-10-CM | POA: Diagnosis not present

## 2016-11-09 NOTE — Progress Notes (Signed)
11/10/2016 11:47 AM   Teresa Rhodes 1970-10-16 [id number]  Referring provider: Idelle Crouch, MD [facility] [place], [place] [address]  Chief Complaint  Patient presents with  . Urinary Frequency    HPI: Patient is a 46 -year-old Caucasian female who is referred to [place] by, Dr. Melrose Nakayama, for nocturia.    Patient states that she has had nocturia for several years.  She underwent a sleep study one year ago and it was negative for sleep apnea.  She is also having associated urinary frequency x 7-9 in addition to the nocturia x 3-4.  She experiences intermittency , hesitancy and weak urinary stream when she postpones urination.    She does not have a history of urinary tract infections, STI's or injury to the bladder.   She denies dysuria, gross hematuria, suprapubic pain, back pain, abdominal pain or flank pain.  She has not had any recent fevers, chills, nausea or vomiting.   She does not have a history of nephrolithiasis, GU surgery or GU trauma.   She is sexually active.  She is not having pain with intercourse.    She admits to constipation and diarrhea.   She is not having pain with bladder filling.  She is experiencing mild SUI for which she does not wear pads.  She may be having some leakage at night without her awareness, but she can't be sure if it is urine or sweat.  She states sometimes her panties are damp when she wakes up.   She had a contrast study on 11/01/2014 which noted No acute or significant finding. No cause of the described symptoms is identified. Surgical changes on the right at L4-5 without unexpected finding.  I have independently reviewed the films.  She is drinking 9 cups of water daily.   She is drinking 2 to 3 caffeinated beverages daily.  She is not drinking alcoholic beverages daily.    She was tried on imipramine, but she states it made her feel weird.  Her UA today was unremarkable.  Her PVR is 0 mL.      PMH: Past  Medical History:  Diagnosis Date  . Complication of anesthesia    states she woke up during  neck surgery  . Depression   . Gallstones   . Migraine   . Seizures ([place]) 2010-2013   Brain seizures    Surgical History: Past Surgical History:  Procedure Laterality Date  . BACK SURGERY    . CHOLECYSTECTOMY  2002  . NECK SURGERY  2005 & 2009   x2    Home Medications:  Allergies as of 11/10/2016      Reactions   Robaxin [methocarbamol] Other (See Comments)   Made skin red and hot and itchy      Medication List       Accurate as of 11/10/16 11:47 AM. Always use your most recent med list.          amitriptyline 50 MG tablet Commonly known as:  ELAVIL Take 50 mg by mouth at bedtime.   DULoxetine 60 MG capsule Commonly known as:  CYMBALTA Take 60 mg by mouth daily.   estrogen-methylTESTOSTERone 0.625-1.25 MG tablet Take 1 tablet by mouth daily.   fluticasone 50 MCG/ACT nasal spray Commonly known as:  FLONASE Place into both nostrils daily.   hyoscyamine 0.125 MG Tbdp disintergrating tablet Commonly known as:  ANASPAZ Place 0.125 mg under the tongue.   polyethylene glycol packet Commonly known  as:  MIRALAX / GLYCOLAX Take 17 g by mouth daily.   topiramate 50 MG tablet Commonly known as:  TOPAMAX Take 50 mg by mouth 2 (two) times daily.       Allergies:  Allergies  Allergen Reactions  . Robaxin [Methocarbamol] Other (See Comments)    Made skin red and hot and itchy    Family History: Family History  Problem Relation Age of Onset  . Ovarian cancer Mother   . Ulcerative colitis Mother   . Liver disease Maternal Grandfather   . Kidney disease Father 69  . Kidney disease Brother 20  . Colon cancer Neg Hx   . Colon polyps Neg Hx   . Esophageal cancer Neg Hx   . Kidney cancer Neg Hx   . Bladder Cancer Neg Hx     Social History:  reports that she quit smoking about 2 years ago. Her smoking use included Cigarettes. She has a 1.50 pack-year smoking  history. She has never used smokeless tobacco. She reports that she does not drink alcohol or use drugs.  ROS: UROLOGY Frequent Urination?: Yes Hard to postpone urination?: No Burning/pain with urination?: No Get up at night to urinate?: Yes Leakage of urine?: Yes Urine stream starts and stops?: Yes Trouble starting stream?: Yes Do you have to strain to urinate?: No Blood in urine?: No Urinary tract infection?: No Sexually transmitted disease?: No Injury to kidneys or bladder?: No Painful intercourse?: No Weak stream?: Yes Currently pregnant?: No Vaginal bleeding?: No Last menstrual period?: n  Gastrointestinal Nausea?: Yes Vomiting?: Yes Indigestion/heartburn?: Yes Diarrhea?: Yes Constipation?: Yes  Constitutional Fever: No Night sweats?: No Weight loss?: No Fatigue?: Yes  Skin Skin rash/lesions?: No Itching?: No  Eyes Blurred vision?: Yes Double vision?: No  Ears/Nose/Throat Sore throat?: No Sinus problems?: Yes  Hematologic/Lymphatic Swollen glands?: No Easy bruising?: Yes  Cardiovascular Leg swelling?: No Chest pain?: No  Respiratory Cough?: No Shortness of breath?: No  Endocrine Excessive thirst?: Yes  Musculoskeletal Back pain?: Yes Joint pain?: Yes  Neurological Headaches?: Yes Dizziness?: Yes  Psychologic Depression?: No Anxiety?: Yes  Physical Exam: BP 129/84 (BP Location: Left Arm, Patient Position: Sitting, Cuff Size: Large)   Pulse 99   Ht 5\' 9"  (1.753 m)   Wt 239 lb 11.2 oz (108.7 kg)   BMI 35.40 kg/m   Constitutional: Well nourished. Alert and oriented, No acute distress. HEENT: [place] AT, moist mucus membranes. Trachea midline, no masses. Cardiovascular: No clubbing, cyanosis, or edema. Respiratory: Normal respiratory effort, no increased work of breathing. GI: Abdomen is soft, non tender, non distended, no abdominal masses. Liver and spleen not palpable.  No hernias appreciated.  Stool sample for occult testing is not  indicated.   GU: No CVA tenderness.  No bladder fullness or masses.  Normal external genitalia, normal pubic hair distribution, no lesions.  Normal urethral meatus, no lesions, no prolapse, no discharge.   No urethral masses, tenderness and/or tenderness. No bladder fullness, tenderness or masses. Normal vagina mucosa, good estrogen effect, no discharge, no lesions, some decrease in pelvic support, no cystocele or rectocele noted. Mild tenderness when palpating pelvic floor muscles.  No cervical motion tenderness.  Uterus is freely mobile and non-fixed.  No adnexal/parametria masses or tenderness noted.  Anus and perineum are without rashes or lesions.    Skin: No rashes, bruises or suspicious lesions. Lymph: No cervical or inguinal adenopathy. Neurologic: Grossly intact, no focal deficits, moving all 4 extremities. Psychiatric: Normal mood and affect.  Laboratory Data: Lab  Results  Component Value Date   WBC 8.8 09/03/2015   HGB 13.8 09/03/2015   HCT 41.3 09/03/2015   MCV 83.1 09/03/2015   PLT 237 09/03/2015    Lab Results  Component Value Date   CREATININE 0.62 09/03/2015     Lab Results  Component Value Date   TSH 1.06 04/16/2014     Lab Results  Component Value Date   AST 60 (H) 11/01/2014   Lab Results  Component Value Date   ALT 148 (H) 11/01/2014    Urinalysis Unremarkable.  See EPIC.    Pertinent Imaging: Results for IYANI, DRESNER (MRN [id number]) as of 11/10/2016 11:51  Ref. Range 11/10/2016 11:15  Scan Result Unknown 0    Assessment & Plan:    1. Daytime frequency  - offered behavioral therapies, bladder training, bladder control strategies and pelvic floor muscle training- patient would like a referral to PT  - fluid management - continue fluid current fluid intake, but curb caffeine intake  - offered medical therapy with anticholinergic therapy or beta-3 adrenergic receptor agonist and the potential side effects of each therapy - patient declined   -  RTC in 3 months for PVR and OAB questionnaire   2. Nocturia  - sleep study performed one year ago was negative for sleep apnea  - may be a symptom of OAB- will reassess when she returns in 3 months  3. Pelvic floor dysfunction  - some small laxity of pelvic muscles on exam with mild tenderness on palpation of pelvic muscles  - refer to PT  Return in about 3 months (around 02/09/2017) for PVR and OAB questionnaire.  These notes generated with voice recognition software. I apologize for typographical errors.  Zara Council, [facility] [address] [place], [place] [address] [phone]

## 2016-11-10 ENCOUNTER — Encounter: Payer: Self-pay | Admitting: Urology

## 2016-11-10 ENCOUNTER — Ambulatory Visit (INDEPENDENT_AMBULATORY_CARE_PROVIDER_SITE_OTHER): Payer: Commercial Managed Care - HMO | Admitting: Urology

## 2016-11-10 VITALS — BP 129/84 | HR 99 | Ht 69.0 in | Wt 239.7 lb

## 2016-11-10 DIAGNOSIS — M6289 Other specified disorders of muscle: Secondary | ICD-10-CM

## 2016-11-10 DIAGNOSIS — R350 Frequency of micturition: Secondary | ICD-10-CM | POA: Diagnosis not present

## 2016-11-10 DIAGNOSIS — R351 Nocturia: Secondary | ICD-10-CM

## 2016-11-10 LAB — URINALYSIS, COMPLETE
BILIRUBIN UA: NEGATIVE
GLUCOSE, UA: NEGATIVE
Ketones, UA: NEGATIVE
LEUKOCYTES UA: NEGATIVE
Nitrite, UA: NEGATIVE
PROTEIN UA: NEGATIVE
RBC, UA: NEGATIVE
Specific Gravity, UA: 1.01 (ref 1.005–1.030)
Urobilinogen, Ur: 0.2 mg/dL (ref 0.2–1.0)
pH, UA: 7.5 (ref 5.0–7.5)

## 2016-11-10 LAB — BLADDER SCAN AMB NON-IMAGING: SCAN RESULT: 0

## 2016-11-11 ENCOUNTER — Ambulatory Visit: Payer: Commercial Managed Care - HMO

## 2016-11-11 ENCOUNTER — Ambulatory Visit
Admission: RE | Admit: 2016-11-11 | Discharge: 2016-11-11 | Disposition: A | Discharge status: Home / Self Care | Payer: Commercial Managed Care - HMO | Source: Ambulatory Visit | Attending: Neurology | Admitting: Neurology

## 2016-11-11 DIAGNOSIS — G35 Multiple sclerosis: Secondary | ICD-10-CM | POA: Insufficient documentation

## 2016-11-11 DIAGNOSIS — R51 Headache: Secondary | ICD-10-CM | POA: Diagnosis not present

## 2016-11-11 LAB — ALBUMIN: ALBUMIN: 4.1 g/dL (ref 3.5–5.0)

## 2016-11-11 LAB — CSF CELL COUNT WITH DIFFERENTIAL
Eosinophils, CSF: 0 %
Lymphs, CSF: 90 %
Monocyte-Macrophage-Spinal Fluid: 10 %
Other Cells, CSF: 0
RBC Count, CSF: 2105 /mm3 — ABNORMAL HIGH (ref 0–3)
SEGMENTED NEUTROPHILS-CSF: 0 %
Tube #: 1
WBC CSF: 8 /mm3 — AB (ref 0–5)

## 2016-11-11 LAB — GLUCOSE, RANDOM: Glucose, Bld: 126 mg/dL — ABNORMAL HIGH (ref 65–99)

## 2016-11-11 LAB — GLUCOSE, CSF: Glucose, CSF: 66 mg/dL (ref 40–70)

## 2016-11-11 LAB — PREGNANCY, URINE: Preg Test, Ur: NEGATIVE

## 2016-11-11 LAB — PROTEIN, CSF: TOTAL PROTEIN, CSF: 38 mg/dL (ref 15–45)

## 2016-11-11 NOTE — Progress Notes (Signed)
Patient ID: Teresa Rhodes, female   DOB: 01/04/1971, 46 y.o.   MRN: [id number] Pt stable after lp.vss,backstable.D/C and f/u instructions given to pt.F/u with her M.D.

## 2016-11-11 NOTE — Progress Notes (Signed)
Patient post lumbar puncture procedure, tolerated well. bandade dressing  Dry and intact, no bleeding. Vitals stable. Denies complaints at this time. Dr Register out to speak with patient with questions answered.May be discharge at 1700 per Dr's orders.

## 2016-11-11 NOTE — Progress Notes (Signed)
Patient remains stable, no changes, ready for discharge.

## 2016-11-14 LAB — JC VIRUS, PCR CSF: JC Virus PCR, CSF: NEGATIVE

## 2016-11-15 LAB — CSF CULTURE W GRAM STAIN
Culture: NO GROWTH
Gram Stain: NONE SEEN

## 2016-11-15 LAB — IGG CSF INDEX
ALBUMIN CSF-MCNC: 21 mg/dL (ref 11–48)
Albumin CSF-mCnc: 20 mg/dL (ref 11–48)
Albumin: 4.2 g/dL (ref 3.5–5.5)
Albumin: 4.2 g/dL (ref 3.5–5.5)
CSF IgG Index: 0.6 (ref 0.0–0.7)
IGG (IMMUNOGLOBIN G), SERUM: 749 mg/dL (ref 700–1600)
IGG (IMMUNOGLOBIN G), SERUM: 742 mg/dL (ref 700–1600)
IGG CSF: 2.1 mg/dL (ref 0.0–8.6)
IGG INDEX CSF: 0.5 (ref 0.0–0.7)
IgG, CSF: 1.9 mg/dL (ref 0.0–8.6)
IgG/Alb Ratio, CSF: 0.1 (ref 0.00–0.25)
IgG/Alb Ratio, CSF: 0.1 (ref 0.00–0.25)

## 2016-11-15 LAB — CSF CULTURE

## 2016-11-16 LAB — OLIGOCLONAL BANDS, CSF + SERM

## 2016-11-17 DIAGNOSIS — R51 Headache: Secondary | ICD-10-CM | POA: Diagnosis not present

## 2016-11-17 DIAGNOSIS — G589 Mononeuropathy, unspecified: Secondary | ICD-10-CM | POA: Diagnosis not present

## 2016-11-17 DIAGNOSIS — G479 Sleep disorder, unspecified: Secondary | ICD-10-CM | POA: Diagnosis not present

## 2016-11-23 ENCOUNTER — Other Ambulatory Visit: Payer: Self-pay | Admitting: Neurology

## 2016-11-23 DIAGNOSIS — I82402 Acute embolism and thrombosis of unspecified deep veins of left lower extremity: Secondary | ICD-10-CM | POA: Insufficient documentation

## 2016-11-23 DIAGNOSIS — M7989 Other specified soft tissue disorders: Secondary | ICD-10-CM

## 2016-11-23 DIAGNOSIS — M79604 Pain in right leg: Secondary | ICD-10-CM

## 2016-11-23 DIAGNOSIS — G589 Mononeuropathy, unspecified: Secondary | ICD-10-CM | POA: Insufficient documentation

## 2016-11-26 ENCOUNTER — Ambulatory Visit
Admission: RE | Admit: 2016-11-26 | Discharge: 2016-11-26 | Disposition: A | Discharge status: Home / Self Care | Payer: Commercial Managed Care - HMO | Source: Ambulatory Visit | Attending: Neurology | Admitting: Neurology

## 2016-11-26 DIAGNOSIS — M7989 Other specified soft tissue disorders: Secondary | ICD-10-CM

## 2016-11-26 DIAGNOSIS — M5416 Radiculopathy, lumbar region: Secondary | ICD-10-CM | POA: Diagnosis not present

## 2016-11-26 DIAGNOSIS — Z4789 Encounter for other orthopedic aftercare: Secondary | ICD-10-CM | POA: Diagnosis not present

## 2016-11-26 DIAGNOSIS — M79604 Pain in right leg: Secondary | ICD-10-CM | POA: Diagnosis not present

## 2016-12-01 ENCOUNTER — Other Ambulatory Visit: Payer: Self-pay | Admitting: Neurology

## 2016-12-01 DIAGNOSIS — G589 Mononeuropathy, unspecified: Secondary | ICD-10-CM

## 2016-12-01 DIAGNOSIS — R569 Unspecified convulsions: Secondary | ICD-10-CM | POA: Insufficient documentation

## 2016-12-03 DIAGNOSIS — R51 Headache: Secondary | ICD-10-CM | POA: Diagnosis not present

## 2016-12-03 DIAGNOSIS — I10 Essential (primary) hypertension: Secondary | ICD-10-CM | POA: Diagnosis not present

## 2016-12-08 DIAGNOSIS — G5791 Unspecified mononeuropathy of right lower limb: Secondary | ICD-10-CM | POA: Diagnosis not present

## 2016-12-08 DIAGNOSIS — M5416 Radiculopathy, lumbar region: Secondary | ICD-10-CM | POA: Diagnosis not present

## 2016-12-08 DIAGNOSIS — M5136 Other intervertebral disc degeneration, lumbar region: Secondary | ICD-10-CM | POA: Diagnosis not present

## 2016-12-14 ENCOUNTER — Ambulatory Visit
Admission: RE | Admit: 2016-12-14 | Discharge: 2016-12-14 | Disposition: A | Discharge status: Home / Self Care | Payer: Commercial Managed Care - HMO | Source: Ambulatory Visit | Attending: Neurology | Admitting: Neurology

## 2016-12-14 DIAGNOSIS — M5126 Other intervertebral disc displacement, lumbar region: Secondary | ICD-10-CM | POA: Insufficient documentation

## 2016-12-14 DIAGNOSIS — Z981 Arthrodesis status: Secondary | ICD-10-CM | POA: Diagnosis not present

## 2016-12-14 DIAGNOSIS — M545 Low back pain: Secondary | ICD-10-CM | POA: Diagnosis not present

## 2016-12-14 DIAGNOSIS — G589 Mononeuropathy, unspecified: Secondary | ICD-10-CM | POA: Diagnosis not present

## 2017-01-17 ENCOUNTER — Ambulatory Visit: Payer: 59 | Admitting: Physical Therapy

## 2017-01-25 DIAGNOSIS — G479 Sleep disorder, unspecified: Secondary | ICD-10-CM | POA: Diagnosis not present

## 2017-01-25 DIAGNOSIS — R51 Headache: Secondary | ICD-10-CM | POA: Diagnosis not present

## 2017-01-26 ENCOUNTER — Ambulatory Visit: Payer: 59 | Attending: Urology | Admitting: Physical Therapy

## 2017-01-26 DIAGNOSIS — M6281 Muscle weakness (generalized): Secondary | ICD-10-CM | POA: Diagnosis not present

## 2017-01-26 DIAGNOSIS — R278 Other lack of coordination: Secondary | ICD-10-CM | POA: Insufficient documentation

## 2017-01-26 DIAGNOSIS — R29898 Other symptoms and signs involving the musculoskeletal system: Secondary | ICD-10-CM | POA: Diagnosis not present

## 2017-01-26 DIAGNOSIS — M533 Sacrococcygeal disorders, not elsewhere classified: Secondary | ICD-10-CM | POA: Diagnosis present

## 2017-01-26 NOTE — Patient Instructions (Signed)
Squatting instead of bending    Proper body mechanics with getting out of a chair to decrease strain  on back &pelvic floor   Avoid holding your breath when Getting out of the chair:  Scoot to front part of chair chair Heels behind feet, feet are hip width apart, nose over toes  Inhale like you are smelling roses Exhale to stand

## 2017-01-27 NOTE — Therapy (Signed)
[place] MAIN [facility] SERVICES [address] [place], [place], [address] Phone: [phone]   Fax:  [phone]  Physical Therapy Evaluation  Patient Details  Name: Teresa Rhodes MRN: [id number] Date of Birth: Mar 13, 1971 Referring Provider: Zara Council   Encounter Date: 01/26/2017      PT End of Session - 01/26/17 1032    Visit Number 1   Number of Visits 12   Date for PT Re-Evaluation 01/23/15   PT Start Time 7673   PT Stop Time 1120   PT Time Calculation (min) 65 min   Activity Tolerance Patient tolerated treatment well   Behavior During Therapy [facility] for tasks assessed/performed      Past Medical History:  Diagnosis Date  . Complication of anesthesia    states she woke up during  neck surgery  . Depression   . Gallstones   . Migraine   . Seizures ([place]) 2010-2013   Brain seizures    Past Surgical History:  Procedure Laterality Date  . BACK SURGERY    . CHOLECYSTECTOMY  2002  . NECK SURGERY  2005 & 2009   x2    There were no vitals filed for this visit.       Subjective Assessment - 01/26/17 1022    Subjective 1) Urinary dysfunction: Pt had frequent urination 2-3x/ every 2 hours and 4-5x/ night for 3-5 years. Her urologist informed her that her bladder had dropped a little bit.  Pt reported she tends to hold her bladder and forget about going to the bathroom when busy at work.  Pt is constantly under stress and is under more stress right now. Her neurologist wants her to see a counselor and she is willing to try.  SUI with coughing, sneezing.  2) loose stools for the past months. Pt saw a GI specialist who Dx her with IBS. Pt used to have constipation. Pt feels she has lost her apetite for solid foods.  3) Numbness and tingling in her both arms and legs.  Over half of the RLE is numb and pt wears a AFO.  LLE gets tingling.  Whole top portion of her arms has tingling.These Sx have occured for the past 3-4 months. Her MDs  have performed MRI, CT, EEG, and test for DVT, and spinal taps with no significant findings.      Pertinent History Hx of 2 low back surgery ( laminectomy, and "cage" for a ruptured disk) and 2 neck surgeries. The tensions she gets goes between her neck and back. Pt performed water Tx for drop foot after surgery. Occupation: sitting all day. Gynecological Hx: vaginal delivery x 1 with perineal tear.    Patient Stated Goals "get my core strengthened". and "not my bladder to fall low"            [facility] PT Assessment - 01/27/17 1304      Assessment   Medical Diagnosis pelvic floor dysfunction   Referring Provider Zara Council      Precautions   Precautions None     Restrictions   Weight Bearing Restrictions No     Balance Screen   Has the patient fallen in the past 6 months No     Coordination   Gross Motor Movements are Fluid and Coordinated --  pelvic lengthening achieved with cue for less chest breathin     Sit to Stand   Comments narrow BOS     Posture/Postural Control   Posture Comments lumbopelvic perturbation  with leg movements      AROM   Overall AROM Comments R rotation ~25%, L 45%.      Palpation   SI assessment  L ASIS more anterior in supine    Palpation comment restricted scars at lumbar spine            Objective measurements completed on examination: See above findings.          [facility] PT Treatment/Exercise - 01/27/17 1304      Therapeutic Activites    Therapeutic Activities --  see pt instructions     Manual Therapy   Manual therapy comments --                PT Education - 01/27/17 1308    Education provided Yes   Education Details POC, anatomy, physiology, HEP, goals   Person(s) Educated Patient   Methods Explanation;Demonstration;Tactile cues;Verbal cues;Handout   Comprehension Returned demonstration;Verbalized understanding             PT Long Term Goals - 01/27/17 1126      PT LONG TERM GOAL #1    Title Pt will decrease her PFDI score from 50% to < 40% in order to restore pelvic floor function   Time 12   Period Weeks   Status New     PT LONG TERM GOAL #2   Title Pt will report decreased ODI score from 20% to < 15% in order to improve ADLs      PT LONG TERM GOAL #3   Title Pt will report better formed stools of [place] Stool Type 4 for 75% of the time with bowel movements across 2 weeks   Time 12   Period Weeks   Status New     PT LONG TERM GOAL #4   Title Pt will demo full spinal ROM and symmetrically aligned pelvic girdle across 2 visits in order to progress to deep core strengthening exercises.    Time 12   Period Weeks   Status New                Plan - 01/26/17 1105    Clinical Impression Statement Pt is a 46 yo female who presents with urinary and bowel dysfunction ( urinary leakage and loose stools).  Thses deficits impact her QOL and ADLs. Pt 's clincia presentations include  limited spinal mobility, pelvic obliquities, dyscoordination of deep core mm, and poor body mechanics which place strain on her pelvic floor muscles.    Clinical Presentation Evolving   Clinical Decision Making moderate   Rehab Potential Good   PT Frequency 1x / week   PT Duration 4 weeks   PT Treatment/Interventions Electrical Stimulation;Cryotherapy;Therapeutic exercise;Patient/family education;Ultrasound;Moist Heat;Neuromuscular re-education;Manual techniques;Manual lymph drainage;Therapeutic activities;Scar mobilization;Energy conservation   Consulted and Agree with Plan of Care Patient      Patient will benefit from skilled therapeutic intervention in order to improve the following deficits and impairments:  Hypomobility, Decreased strength, Decreased safety awareness, Decreased balance, Decreased activity tolerance, Decreased endurance, Postural dysfunction, Improper body mechanics, Decreased coordination, Decreased mobility, Decreased range of motion  Visit Diagnosis: Muscle  weakness (generalized)  Other symptoms and signs involving the musculoskeletal system  Sacrococcygeal disorders, not elsewhere classified  Other lack of coordination     Problem List Patient Active Problem List   Diagnosis Date Noted  . Degeneration of intervertebral disc of lumbosacral region 09/12/2015  . Headache, migraine 09/12/2015  . Current tobacco use 09/12/2015  . Back pain 09/11/2015  .  Lumbosacral radiculopathy 07/22/2014  . White matter abnormality on MRI of brain 06/12/2014  . Foot drop 06/12/2014  . Right arm weakness 06/12/2014    Jerl Mina  ,PT, DPT, E-RYT   01/27/2017, 1:16 PM  [place] [facility] SERVICES [address] [place], [place], [address] Phone: [phone]   Fax:  [phone]  Name: Teresa Rhodes MRN: [id number] Date of Birth: 09/06/1970

## 2017-01-31 ENCOUNTER — Ambulatory Visit: Payer: 59 | Admitting: Physical Therapy

## 2017-02-03 ENCOUNTER — Ambulatory Visit
Admission: RE | Admit: 2017-02-03 | Discharge: 2017-02-03 | Disposition: A | Discharge status: Home / Self Care | Payer: 59 | Source: Ambulatory Visit | Attending: Otolaryngology | Admitting: Otolaryngology

## 2017-02-03 ENCOUNTER — Other Ambulatory Visit: Payer: Self-pay | Admitting: Otolaryngology

## 2017-02-03 DIAGNOSIS — R05 Cough: Secondary | ICD-10-CM | POA: Diagnosis not present

## 2017-02-03 DIAGNOSIS — R059 Cough, unspecified: Secondary | ICD-10-CM

## 2017-02-03 DIAGNOSIS — K219 Gastro-esophageal reflux disease without esophagitis: Secondary | ICD-10-CM | POA: Diagnosis not present

## 2017-02-03 DIAGNOSIS — J301 Allergic rhinitis due to pollen: Secondary | ICD-10-CM | POA: Diagnosis not present

## 2017-02-03 DIAGNOSIS — Z981 Arthrodesis status: Secondary | ICD-10-CM | POA: Insufficient documentation

## 2017-02-07 ENCOUNTER — Ambulatory Visit: Payer: 59 | Admitting: Physical Therapy

## 2017-02-10 ENCOUNTER — Encounter: Payer: Self-pay | Admitting: Urology

## 2017-02-10 ENCOUNTER — Ambulatory Visit: Payer: Commercial Managed Care - HMO | Admitting: Urology

## 2017-02-11 ENCOUNTER — Encounter: Payer: Self-pay | Admitting: Physical Therapy

## 2017-02-21 ENCOUNTER — Ambulatory Visit: Payer: 59 | Attending: Urology | Admitting: Physical Therapy

## 2017-03-07 ENCOUNTER — Ambulatory Visit: Payer: 59 | Admitting: Physical Therapy

## 2017-03-09 DIAGNOSIS — R05 Cough: Secondary | ICD-10-CM | POA: Diagnosis not present

## 2017-03-14 DIAGNOSIS — E7800 Pure hypercholesterolemia, unspecified: Secondary | ICD-10-CM | POA: Diagnosis not present

## 2017-03-14 DIAGNOSIS — R569 Unspecified convulsions: Secondary | ICD-10-CM | POA: Diagnosis not present

## 2017-03-14 DIAGNOSIS — I10 Essential (primary) hypertension: Secondary | ICD-10-CM | POA: Diagnosis not present

## 2017-03-14 DIAGNOSIS — Z79899 Other long term (current) drug therapy: Secondary | ICD-10-CM | POA: Diagnosis not present

## 2017-03-14 DIAGNOSIS — R51 Headache: Secondary | ICD-10-CM | POA: Diagnosis not present

## 2017-03-17 DIAGNOSIS — G4733 Obstructive sleep apnea (adult) (pediatric): Secondary | ICD-10-CM | POA: Diagnosis not present

## 2017-03-23 ENCOUNTER — Encounter: Payer: Commercial Managed Care - HMO | Admitting: Physical Therapy

## 2017-03-30 DIAGNOSIS — R110 Nausea: Secondary | ICD-10-CM | POA: Diagnosis not present

## 2017-03-30 DIAGNOSIS — R1084 Generalized abdominal pain: Secondary | ICD-10-CM | POA: Diagnosis not present

## 2017-03-30 DIAGNOSIS — K625 Hemorrhage of anus and rectum: Secondary | ICD-10-CM | POA: Diagnosis not present

## 2017-03-30 DIAGNOSIS — R109 Unspecified abdominal pain: Secondary | ICD-10-CM | POA: Diagnosis not present

## 2017-04-07 ENCOUNTER — Observation Stay
Admission: EM | Admit: 2017-04-07 | Discharge: 2017-04-10 | Disposition: A | Discharge status: Home / Self Care | Payer: Commercial Managed Care - HMO | Attending: Internal Medicine | Admitting: Internal Medicine

## 2017-04-07 ENCOUNTER — Emergency Department: Payer: Commercial Managed Care - HMO

## 2017-04-07 DIAGNOSIS — F329 Major depressive disorder, single episode, unspecified: Secondary | ICD-10-CM | POA: Diagnosis not present

## 2017-04-07 DIAGNOSIS — Z79899 Other long term (current) drug therapy: Secondary | ICD-10-CM | POA: Diagnosis not present

## 2017-04-07 DIAGNOSIS — R1110 Vomiting, unspecified: Secondary | ICD-10-CM | POA: Diagnosis not present

## 2017-04-07 DIAGNOSIS — Z8371 Family history of colonic polyps: Secondary | ICD-10-CM | POA: Insufficient documentation

## 2017-04-07 DIAGNOSIS — Z8041 Family history of malignant neoplasm of ovary: Secondary | ICD-10-CM | POA: Diagnosis not present

## 2017-04-07 DIAGNOSIS — R112 Nausea with vomiting, unspecified: Secondary | ICD-10-CM | POA: Diagnosis present

## 2017-04-07 DIAGNOSIS — D72829 Elevated white blood cell count, unspecified: Secondary | ICD-10-CM | POA: Diagnosis not present

## 2017-04-07 DIAGNOSIS — K2950 Unspecified chronic gastritis without bleeding: Secondary | ICD-10-CM | POA: Insufficient documentation

## 2017-04-07 DIAGNOSIS — K640 First degree hemorrhoids: Secondary | ICD-10-CM | POA: Diagnosis not present

## 2017-04-07 DIAGNOSIS — R1013 Epigastric pain: Principal | ICD-10-CM | POA: Insufficient documentation

## 2017-04-07 DIAGNOSIS — R109 Unspecified abdominal pain: Secondary | ICD-10-CM | POA: Diagnosis not present

## 2017-04-07 DIAGNOSIS — R1084 Generalized abdominal pain: Secondary | ICD-10-CM | POA: Diagnosis not present

## 2017-04-07 DIAGNOSIS — K317 Polyp of stomach and duodenum: Secondary | ICD-10-CM | POA: Insufficient documentation

## 2017-04-07 DIAGNOSIS — R52 Pain, unspecified: Secondary | ICD-10-CM

## 2017-04-07 DIAGNOSIS — Z87891 Personal history of nicotine dependence: Secondary | ICD-10-CM | POA: Insufficient documentation

## 2017-04-07 LAB — CBC
HEMATOCRIT: 42 % (ref 35.0–47.0)
Hemoglobin: 14.4 g/dL (ref 12.0–16.0)
MCH: 28.3 pg (ref 26.0–34.0)
MCHC: 34.2 g/dL (ref 32.0–36.0)
MCV: 82.7 fL (ref 80.0–100.0)
Platelets: 244 10*3/uL (ref 150–440)
RBC: 5.08 MIL/uL (ref 3.80–5.20)
RDW: 13.8 % (ref 11.5–14.5)
WBC: 14.1 10*3/uL — AB (ref 3.6–11.0)

## 2017-04-07 LAB — URINALYSIS, COMPLETE (UACMP) WITH MICROSCOPIC
BILIRUBIN URINE: NEGATIVE
Bacteria, UA: NONE SEEN
Glucose, UA: NEGATIVE mg/dL
Hgb urine dipstick: NEGATIVE
Ketones, ur: NEGATIVE mg/dL
LEUKOCYTES UA: NEGATIVE
Nitrite: NEGATIVE
PH: 7 (ref 5.0–8.0)
Protein, ur: NEGATIVE mg/dL
RBC / HPF: NONE SEEN RBC/hpf (ref 0–5)
SPECIFIC GRAVITY, URINE: 1.014 (ref 1.005–1.030)

## 2017-04-07 LAB — COMPREHENSIVE METABOLIC PANEL
ALBUMIN: 4.1 g/dL (ref 3.5–5.0)
ALT: 30 U/L (ref 14–54)
AST: 27 U/L (ref 15–41)
Alkaline Phosphatase: 90 U/L (ref 38–126)
Anion gap: 8 (ref 5–15)
CHLORIDE: 106 mmol/L (ref 101–111)
CO2: 25 mmol/L (ref 22–32)
CREATININE: 0.5 mg/dL (ref 0.44–1.00)
Calcium: 9.3 mg/dL (ref 8.9–10.3)
GFR calc Af Amer: >60 mL/min (ref >60)
GLUCOSE: 110 mg/dL — AB (ref 65–99)
POTASSIUM: 3.6 mmol/L (ref 3.5–5.1)
Sodium: 139 mmol/L (ref 135–145)
Total Bilirubin: 0.6 mg/dL (ref 0.3–1.2)
Total Protein: 7.7 g/dL (ref 6.5–8.1)

## 2017-04-07 LAB — PREGNANCY, URINE: Preg Test, Ur: NEGATIVE

## 2017-04-07 LAB — LIPASE, BLOOD: LIPASE: 22 U/L (ref 11–51)

## 2017-04-07 MED ORDER — EST ESTROGENS-METHYLTEST 0.625-1.25 MG PO TABS: 1.0000 | ORAL_TABLET | Freq: Every day | ORAL | Status: DC

## 2017-04-07 MED ORDER — ONDANSETRON HCL 4 MG/2ML IJ SOLN
4.0000 mg | Freq: Four times a day (QID) | INTRAMUSCULAR | Status: DC | PRN
  Administered 2017-04-07 – 2017-04-09 (×5): 4 mg via INTRAVENOUS
  Filled 2017-04-07 (×5): qty 2

## 2017-04-07 MED ORDER — ENOXAPARIN SODIUM 40 MG/0.4ML ~~LOC~~ SOLN
40.0000 mg | SUBCUTANEOUS | Status: DC
  Administered 2017-04-07 – 2017-04-09 (×3): 40 mg via SUBCUTANEOUS
  Filled 2017-04-07 (×3): qty 0.4

## 2017-04-07 MED ORDER — MORPHINE SULFATE (PF) 4 MG/ML IV SOLN
INTRAVENOUS | Status: AC
Start: 2017-04-07 — End: 2017-04-08
  Filled 2017-04-07: qty 1

## 2017-04-07 MED ORDER — IOPAMIDOL (ISOVUE-300) INJECTION 61%
30.0000 mL | Freq: Once | INTRAVENOUS | Status: AC | PRN
  Administered 2017-04-07: 30 mL via ORAL
  Filled 2017-04-07: qty 30

## 2017-04-07 MED ORDER — ACETAMINOPHEN 650 MG RE SUPP: 650.0000 mg | Freq: Four times a day (QID) | RECTAL | Status: DC | PRN

## 2017-04-07 MED ORDER — DULOXETINE HCL 30 MG PO CPEP: 60.0000 mg | ORAL_CAPSULE | Freq: Every day | ORAL | Status: DC

## 2017-04-07 MED ORDER — ONDANSETRON HCL 4 MG/2ML IJ SOLN
INTRAMUSCULAR | Status: AC
  Filled 2017-04-07: qty 2

## 2017-04-07 MED ORDER — TOPIRAMATE 25 MG PO TABS
50.0000 mg | ORAL_TABLET | Freq: Two times a day (BID) | ORAL | Status: DC
  Filled 2017-04-07 (×3): qty 2

## 2017-04-07 MED ORDER — AMITRIPTYLINE HCL 25 MG PO TABS: 50.0000 mg | ORAL_TABLET | Freq: Every day | ORAL | Status: DC

## 2017-04-07 MED ORDER — HYDROCODONE-ACETAMINOPHEN 5-325 MG PO TABS: 1.0000 | ORAL_TABLET | ORAL | Status: DC | PRN

## 2017-04-07 MED ORDER — ONDANSETRON HCL 4 MG PO TABS: 4.0000 mg | ORAL_TABLET | Freq: Four times a day (QID) | ORAL | Status: DC | PRN

## 2017-04-07 MED ORDER — FLUTICASONE PROPIONATE 50 MCG/ACT NA SUSP
1.0000 | Freq: Every day | NASAL | Status: DC
  Filled 2017-04-07: qty 16

## 2017-04-07 MED ORDER — ACETAMINOPHEN 325 MG PO TABS: 650.0000 mg | ORAL_TABLET | Freq: Four times a day (QID) | ORAL | Status: DC | PRN

## 2017-04-07 MED ORDER — SENNOSIDES-DOCUSATE SODIUM 8.6-50 MG PO TABS: 1.0000 | ORAL_TABLET | Freq: Every evening | ORAL | Status: DC | PRN

## 2017-04-07 MED ORDER — SODIUM CHLORIDE 0.9 % IV BOLUS (SEPSIS)
1000.0000 mL | INTRAVENOUS | Status: AC
  Administered 2017-04-07: 1000 mL via INTRAVENOUS

## 2017-04-07 MED ORDER — MORPHINE SULFATE (PF) 4 MG/ML IV SOLN
4.0000 mg | Freq: Once | INTRAVENOUS | Status: AC
Start: 2017-04-07 — End: 2017-04-07
  Administered 2017-04-07: 4 mg via INTRAVENOUS
  Filled 2017-04-07: qty 1

## 2017-04-07 MED ORDER — ONDANSETRON HCL 4 MG/2ML IJ SOLN
4.0000 mg | INTRAMUSCULAR | Status: AC
Start: 2017-04-07 — End: 2017-04-07
  Administered 2017-04-07: 4 mg via INTRAVENOUS

## 2017-04-07 MED ORDER — BISACODYL 5 MG PO TBEC: 5.0000 mg | DELAYED_RELEASE_TABLET | Freq: Every day | ORAL | Status: DC | PRN

## 2017-04-07 MED ORDER — IOPAMIDOL (ISOVUE-300) INJECTION 61%
100.0000 mL | Freq: Once | INTRAVENOUS | Status: AC | PRN
  Administered 2017-04-07: 100 mL via INTRAVENOUS
  Filled 2017-04-07: qty 100

## 2017-04-07 MED ORDER — ALBUTEROL SULFATE (2.5 MG/3ML) 0.083% IN NEBU: 2.5000 mg | INHALATION_SOLUTION | RESPIRATORY_TRACT | Status: DC | PRN

## 2017-04-07 MED ORDER — POTASSIUM CHLORIDE IN NACL 20-0.9 MEQ/L-% IV SOLN
INTRAVENOUS | Status: DC
  Administered 2017-04-07 – 2017-04-10 (×4): via INTRAVENOUS
  Filled 2017-04-07 (×8): qty 1000

## 2017-04-07 MED ORDER — KETOROLAC TROMETHAMINE 30 MG/ML IJ SOLN
30.0000 mg | Freq: Four times a day (QID) | INTRAMUSCULAR | Status: DC | PRN
  Administered 2017-04-07 – 2017-04-09 (×7): 30 mg via INTRAVENOUS
  Filled 2017-04-07 (×7): qty 1

## 2017-04-07 MED ORDER — MORPHINE SULFATE (PF) 4 MG/ML IV SOLN
4.0000 mg | Freq: Once | INTRAVENOUS | Status: AC
  Administered 2017-04-07: 4 mg via INTRAVENOUS

## 2017-04-07 MED ORDER — IOPAMIDOL (ISOVUE-300) INJECTION 61%
30.0000 mL | Freq: Once | INTRAVENOUS | Status: AC
  Administered 2017-04-08: 06:00:00 30 mL via ORAL
  Filled 2017-04-07: qty 30

## 2017-04-07 MED ORDER — ONDANSETRON HCL 4 MG/2ML IJ SOLN
4.0000 mg | Freq: Once | INTRAMUSCULAR | Status: AC
Start: 2017-04-07 — End: 2017-04-07
  Administered 2017-04-07: 4 mg via INTRAVENOUS

## 2017-04-07 MED ORDER — ONDANSETRON HCL 4 MG/2ML IJ SOLN
INTRAMUSCULAR | Status: AC
  Administered 2017-04-07: 4 mg via INTRAVENOUS
  Filled 2017-04-07: qty 2

## 2017-04-07 NOTE — H&P (Signed)
[facility] [place] NAME: Teresa Rhodes    MR#:  [id number]  DATE OF BIRTH:  [birth year]  DATE OF ADMISSION:  04/07/2017  PRIMARY CARE PHYSICIAN: Idelle Crouch, MD   REQUESTING/REFERRING PHYSICIAN: Hinda Kehr, MD  CHIEF COMPLAINT:   Chief Complaint  Patient presents with  . Abdominal Pain   Abdominal pain, nausea and vomiting for 8 days. HISTORY OF PRESENT ILLNESS:  Teresa Rhodes  is a 46 y.o. female with a known history of Migraine, seizure disorder and depression. The patient presently ED with abdominal pain, nausea and vomiting for 8 days. She was evaluated by Dr. Doy Hutching' PA for abdominal pain, nausea and vomiting 8 days ago. She was told possible nonspecific intra-abdominal infection and given Augmentin. She also complains of bloody stool 2 weeks ago. Her abdominal pain, nausea and vomiting helping worsening for the past few days. The abdominal pain is in epigastric area, 8 out of 10, intermittent without radiation. She vomited at least a 2-3 times a day. She cannot take anything down. The patient said she had similar symptoms 6 years ago, which lasted about months and she was told possible gastroparesis. She got colonoscopy at that time which showed polyps per the patient. CAT scan of the abdomen in the ED is unremarkable.  PAST MEDICAL HISTORY:   Past Medical History:  Diagnosis Date  . Complication of anesthesia    states she woke up during  neck surgery  . Depression   . Gallstones   . Migraine   . Seizures ([place]) 2010-2013   Brain seizures    PAST SURGICAL HISTORY:   Past Surgical History:  Procedure Laterality Date  . BACK SURGERY    . CHOLECYSTECTOMY  2002  . NECK SURGERY  2005 & 2009   x2    SOCIAL HISTORY:   Social History  Substance Use Topics  . Smoking status: Former Smoker    Packs/day: 0.50    Years: 3.00    Types: Cigarettes    Quit date: 10/24/2014  . Smokeless tobacco: Never Used   Comment: pt given handout  . Alcohol use No    FAMILY HISTORY:   Family History  Problem Relation Age of Onset  . Ovarian cancer Mother   . Ulcerative colitis Mother   . Liver disease Maternal Grandfather   . Kidney disease Father 73  . Kidney disease Brother 20  . Colon cancer Neg Hx   . Colon polyps Neg Hx   . Esophageal cancer Neg Hx   . Kidney cancer Neg Hx   . Bladder Cancer Neg Hx     DRUG ALLERGIES:   Allergies  Allergen Reactions  . Robaxin [Methocarbamol] Other (See Comments)    Made skin red and hot and itchy    REVIEW OF SYSTEMS:   Review of Systems  Constitutional: Positive for chills and malaise/fatigue. Negative for fever.  HENT: Negative for sore throat.   Eyes: Negative for blurred vision and double vision.  Respiratory: Negative for cough, hemoptysis, shortness of breath, wheezing and stridor.   Cardiovascular: Negative for chest pain, palpitations, orthopnea and leg swelling.  Gastrointestinal: Positive for abdominal pain, nausea and vomiting. Negative for blood in stool, constipation, diarrhea and melena.  Genitourinary: Negative for dysuria, flank pain and hematuria.  Musculoskeletal: Negative for back pain and joint pain.  Neurological: Negative for dizziness, sensory change, focal weakness, seizures, loss of consciousness, weakness and headaches.  Endo/Heme/Allergies: Negative for polydipsia.  Psychiatric/Behavioral: Negative  for depression. The patient is not nervous/anxious.     MEDICATIONS AT HOME:   Prior to Admission medications   Medication Sig Start Date End Date Taking? Authorizing Provider  amitriptyline (ELAVIL) 50 MG tablet Take 50 mg by mouth at bedtime.    [provider]  DULoxetine (CYMBALTA) 60 MG capsule Take 60 mg by mouth daily.    [provider]  estrogen-methylTESTOSTERone 0.625-1.25 MG tablet Take 1 tablet by mouth daily.    [provider]  fluticasone (FLONASE) 50 MCG/ACT nasal spray Place  into both nostrils daily.    [provider]  hyoscyamine (ANASPAZ) 0.125 MG TBDP disintergrating tablet Place 0.125 mg under the tongue.    [provider]  nortriptyline (PAMELOR) 25 MG capsule Take 25 mg by mouth at bedtime.    [provider]  polyethylene glycol (MIRALAX / GLYCOLAX) packet Take 17 g by mouth daily. Patient not taking: Reported on 11/10/2016 09/14/15   Dara Lords, Alexzandrew L, PA-C  topiramate (TOPAMAX) 50 MG tablet Take 50 mg by mouth 2 (two) times daily.    [provider]      VITAL SIGNS:  Blood pressure (!) 143/76, pulse 81, temperature 97.7 F (36.5 C), temperature source Oral, resp. rate 20, height 5\' 9"  (1.753 m), weight 238 lb (108 kg), SpO2 100 %.  PHYSICAL EXAMINATION:  Physical Exam  GENERAL:  46 y.o.-year-old patient lying in the bed with no acute distress. Morbid obesity EYES: Pupils equal, round, reactive to light and accommodation. No scleral icterus. Extraocular muscles intact.  HEENT: Head atraumatic, normocephalic. Oropharynx and nasopharynx clear.  NECK:  Supple, no jugular venous distention. No thyroid enlargement, no tenderness.  LUNGS: Normal breath sounds bilaterally, no wheezing, rales,rhonchi or crepitation. No use of accessory muscles of respiration.  CARDIOVASCULAR: S1, S2 normal. No murmurs, rubs, or gallops.  ABDOMEN: Soft, diffuse tenderness, nondistended. Bowel sounds present. No organomegaly or mass.  EXTREMITIES: No pedal edema, cyanosis, or clubbing.  NEUROLOGIC: Cranial nerves II through XII are intact. Muscle strength 5/5 in all extremities. Sensation intact. Gait not checked.  PSYCHIATRIC: The patient is alert and oriented x 3.  SKIN: No obvious rash, lesion, or ulcer.   LABORATORY PANEL:   CBC  Recent Labs Lab 04/07/17 1633  WBC 14.1*  HGB 14.4  HCT 42.0  PLT 244    ------------------------------------------------------------------------------------------------------------------  Chemistries   Recent Labs Lab 04/07/17 1633  NA 139  K 3.6  CL 106  CO2 25  GLUCOSE 110*  BUN <5*  CREATININE 0.50  CALCIUM 9.3  AST 27  ALT 30  ALKPHOS 90  BILITOT 0.6   ------------------------------------------------------------------------------------------------------------------  Cardiac Enzymes No results for input(s): TROPONINI in the last 168 hours. ------------------------------------------------------------------------------------------------------------------  RADIOLOGY:  Ct Abdomen Pelvis W Contrast  Result Date: 04/07/2017 CLINICAL DATA:  Epigastric pain with nausea and vomiting EXAM: CT ABDOMEN AND PELVIS WITH CONTRAST TECHNIQUE: Multidetector CT imaging of the abdomen and pelvis was performed using the standard protocol following bolus administration of intravenous contrast. CONTRAST:  128mL ISOVUE-300 IOPAMIDOL (ISOVUE-300) INJECTION 61% COMPARISON:  11/01/2014 FINDINGS: Lower chest: No acute abnormality. Hepatobiliary: Mild steatosis. Surgical clips at the gallbladder fossa. No biliary dilatation. No focal hepatic abnormality. Pancreas: Unremarkable. No pancreatic ductal dilatation or surrounding inflammatory changes. Spleen: Normal in size without focal abnormality. Adrenals/Urinary Tract: Adrenal glands are unremarkable. Kidneys are normal, without renal calculi, focal lesion, or hydronephrosis. Bladder is unremarkable. Stomach/Bowel: Stomach is within normal limits. Appendix appears normal. No evidence of bowel wall thickening, distention, or  inflammatory changes. Vascular/Lymphatic: No significant vascular findings are present. No enlarged abdominal or pelvic lymph nodes. Reproductive: Uterus and bilateral adnexa are unremarkable. Other: Negative for free air or free fluid. Fat containing umbilical hernia. Musculoskeletal: Posterior stabilization  rods and fixating screws at L4-L5 with interbody device. IMPRESSION: 1. No CT evidence for acute intra-abdominal or pelvic pathology. 2. Mild steatosis Electronically Signed   By: Donavan Foil M.D.   On: 04/07/2017 19:55      IMPRESSION AND PLAN:   Intractable abdominal pain, nausea and vomiting, possible acute gastritis/gastroparesis. The patient will be placed for observation. Pain control with Zofran when necessary. Nothing by mouth with IV fluid support. GI consult.  Leukocytosis. Possible due to reaction. Follow-up CBC. Morbid obesity.  All the records are reviewed and case discussed with ED provider. Management plans discussed with the patient, her husband and they are in agreement.  CODE STATUS: Full code  TOTAL TIME TAKING CARE OF THIS PATIENT: 50 minutes.    Demetrios Loll M.D on 04/07/2017 at 10:08 PM  Between 7am to 6pm - Pager - [phone]  After 6pm go to [email or web address] - Proofreader  [facility]  Office  [phone]  CC: Primary care physician; Idelle Crouch, MD   Note: This dictation was prepared with Dragon dictation along with smaller phrase technology. Any transcriptional errors that result from this process are unin

## 2017-04-07 NOTE — ED Provider Notes (Signed)
[facility] Emergency Department Provider Note  ____________________________________________   First MD Initiated Contact with Patient 04/07/17 1756     (approximate)  I have reviewed the triage vital signs and the nursing notes.   HISTORY  Chief Complaint Abdominal Pain    HPI Teresa Rhodes is a 46 y.o. female With medical history as listed below who presents for evaluation of persistent vomiting the last few days.  She was seen by Mr. Tumey (PA at PCP's office, usually sees Dr. Doy Hutching) and evaluated for some abdominal pain and nausea and vomiting and possible blood in stool.  She was started on Augmentin for possible nonspecific intra-abdominal infection. she no longer reports any blood in her stool, but she reports that she has not been able to keep anything down over the last few days and that it has gotten worse compared to when she was seen in the clinic 8 days ago.  She states that she has not been able to keep anything down whereas before she was able to keep down Gatorade.  She saw Dr. Doy Hutching in clinic today and he sent her to the emergency department for IV fluids and further evaluation.  Nothing in particular makes the patient's symptoms better nor worse.    She denies fever/chills, chest pain, shortness of breath.  She states that the abdominal pain is moderate to severe and all over her abdomen, not localized to any one particular spot.  She denies dysuria.   Past Medical History:  Diagnosis Date  . Complication of anesthesia    states she woke up during  neck surgery  . Depression   . Gallstones   . Migraine   . Seizures ([place]) 2010-2013   Brain seizures    Patient Active Problem List   Diagnosis Date Noted  . Degeneration of intervertebral disc of lumbosacral region 09/12/2015  . Headache, migraine 09/12/2015  . Current tobacco use 09/12/2015  . Back pain 09/11/2015  . Lumbosacral radiculopathy 07/22/2014  . White matter abnormality  on MRI of brain 06/12/2014  . Foot drop 06/12/2014  . Right arm weakness 06/12/2014    Past Surgical History:  Procedure Laterality Date  . BACK SURGERY    . CHOLECYSTECTOMY  2002  . NECK SURGERY  2005 & 2009   x2    Prior to Admission medications   Medication Sig Start Date End Date Taking? Authorizing Provider  amitriptyline (ELAVIL) 50 MG tablet Take 50 mg by mouth at bedtime.    [provider]  DULoxetine (CYMBALTA) 60 MG capsule Take 60 mg by mouth daily.    [provider]  estrogen-methylTESTOSTERone 0.625-1.25 MG tablet Take 1 tablet by mouth daily.    [provider]  fluticasone (FLONASE) 50 MCG/ACT nasal spray Place into both nostrils daily.    [provider]  hyoscyamine (ANASPAZ) 0.125 MG TBDP disintergrating tablet Place 0.125 mg under the tongue.    [provider]  nortriptyline (PAMELOR) 25 MG capsule Take 25 mg by mouth at bedtime.    [provider]  polyethylene glycol (MIRALAX / GLYCOLAX) packet Take 17 g by mouth daily. Patient not taking: Reported on 11/10/2016 09/14/15   Dara Lords, Alexzandrew L, PA-C  topiramate (TOPAMAX) 50 MG tablet Take 50 mg by mouth 2 (two) times daily.    [provider]    Allergies Robaxin [methocarbamol]  Family History  Problem Relation Age of Onset  . Ovarian cancer Mother   . Ulcerative colitis Mother   .  Liver disease Maternal Grandfather   . Kidney disease Father 38  . Kidney disease Brother 45  . Colon cancer Neg Hx   . Colon polyps Neg Hx   . Esophageal cancer Neg Hx   . Kidney cancer Neg Hx   . Bladder Cancer Neg Hx     Social History Social History  Substance Use Topics  . Smoking status: Former Smoker    Packs/day: 0.50    Years: 3.00    Types: Cigarettes    Quit date: 10/24/2014  . Smokeless tobacco: Never Used     Comment: pt given handout  . Alcohol use No    Review of Systems Constitutional: No fever/chills Eyes: No visual changes. ENT:  No sore throat. Cardiovascular: Denies chest pain. Respiratory: Denies shortness of breath. Gastrointestinal: generalized abdominal pain with intractable nausea and vomiting.  The symptoms of been present for more than a week but it got worse over the last several days Genitourinary: Negative for dysuria. Musculoskeletal: Negative for neck pain.  Negative for back pain. Integumentary: Negative for rash. Neurological: Negative for headaches, focal weakness or numbness.   ____________________________________________   PHYSICAL EXAM:  VITAL SIGNS: ED Triage Vitals  Enc Vitals Group     BP 04/07/17 1632 139/80     Pulse Rate 04/07/17 1632 99     Resp 04/07/17 1632 18     Temp 04/07/17 1632 (!) 97.5 F (36.4 C)     Temp Source 04/07/17 1632 Oral     SpO2 04/07/17 1632 100 %     Weight 04/07/17 1633 108 kg (238 lb)     Height 04/07/17 1633 1.753 m (5\' 9" )     Head Circumference --      Peak Flow --      Pain Score 04/07/17 1630 8     Pain Loc --      Pain Edu? --      Excl. in [place]? --     Constitutional: Alert and oriented. generally well-appearing but does appear anxious and  comfortable Eyes: Conjunctivae are normal.  Head: Atraumatic. Nose: No congestion/rhinnorhea. Mouth/Throat: Mucous membranes are moist. Neck: No stridor.  No meningeal signs.   Cardiovascular: borderline tachycardia, regular rhythm. Good peripheral circulation. Grossly normal heart sounds. Respiratory: Normal respiratory effort.  No retractions. Lungs CTAB. Gastrointestinal: obese. Soft with diffuse abdominal pain throughout, no localizable tenderness, no rebound and no guarding Musculoskeletal: No lower extremity tenderness nor edema. No gross deformities of extremities. Neurologic:  Normal speech and language. No gross focal neurologic deficits are appreciated.  Skin:  Skin is warm, dry and intact. No rash noted. Psychiatric: Mood and affect are anxious.    ____________________________________________   LABS (all labs ordered are listed, but only abnormal results are displayed)  Labs Reviewed  COMPREHENSIVE METABOLIC PANEL - Abnormal; Notable for the following:       Result Value   Glucose, Bld 110 (*)    BUN <5 (*)    All other components within normal limits  CBC - Abnormal; Notable for the following:    WBC 14.1 (*)    All other components within normal limits  URINALYSIS, COMPLETE (UACMP) WITH MICROSCOPIC - Abnormal; Notable for the following:    Color, Urine YELLOW (*)    APPearance CLEAR (*)    Squamous Epithelial / LPF 0-5 (*)    All other components within normal limits  LIPASE, BLOOD  PREGNANCY, URINE   ____________________________________________  EKG  None - EKG not ordered by ED  physician ____________________________________________  RADIOLOGY   Ct Abdomen Pelvis W Contrast  Result Date: 04/07/2017 CLINICAL DATA:  Epigastric pain with nausea and vomiting EXAM: CT ABDOMEN AND PELVIS WITH CONTRAST TECHNIQUE: Multidetector CT imaging of the abdomen and pelvis was performed using the standard protocol following bolus administration of intravenous contrast. CONTRAST:  127mL ISOVUE-300 IOPAMIDOL (ISOVUE-300) INJECTION 61% COMPARISON:  11/01/2014 FINDINGS: Lower chest: No acute abnormality. Hepatobiliary: Mild steatosis. Surgical clips at the gallbladder fossa. No biliary dilatation. No focal hepatic abnormality. Pancreas: Unremarkable. No pancreatic ductal dilatation or surrounding inflammatory changes. Spleen: Normal in size without focal abnormality. Adrenals/Urinary Tract: Adrenal glands are unremarkable. Kidneys are normal, without renal calculi, focal lesion, or hydronephrosis. Bladder is unremarkable. Stomach/Bowel: Stomach is within normal limits. Appendix appears normal. No evidence of bowel wall thickening, distention, or inflammatory changes. Vascular/Lymphatic: No significant vascular findings are present. No  enlarged abdominal or pelvic lymph nodes. Reproductive: Uterus and bilateral adnexa are unremarkable. Other: Negative for free air or free fluid. Fat containing umbilical hernia. Musculoskeletal: Posterior stabilization rods and fixating screws at L4-L5 with interbody device. IMPRESSION: 1. No CT evidence for acute intra-abdominal or pelvic pathology. 2. Mild steatosis Electronically Signed   By: Donavan Foil M.D.   On: 04/07/2017 19:55    ____________________________________________   PROCEDURES  Critical Care performed: No   Procedure(s) performed:   Procedures   ____________________________________________   INITIAL IMPRESSION / ASSESSMENT AND PLAN / ED COURSE  Pertinent labs & imaging results that were available during my care of the patient were reviewed by me and considered in my medical decision making (see chart for details).  the patient is clearly anxious about her symptoms and she does have chronic constipation on her medical history list.  It sounds as if she was put on Augmentin for a nonspecific possible intra-abdominal infection and it has not significantly changed her symptoms.  Her vomiting is the primary concern for her at this point.  I reviewed the electronic medical records from both recent clinic visits including her lab work.her labs are better at this time with a corrected potassium (it was a little bit low before at 3.1 but today it is 3.6) and there are no other warning flags in her lab work except for a slight leukocytosis of 14.1. his is up from 9.1 eight days ago but is also very nonspecific.  Given her generalized diffuse abdominal pain that seems to be worse in the epigastrium and her worsening vomiting, I will evaluate with a CT scan of her abdomen and pelvis with oral and IV contrast. Differential diagnosis includes but is not exclusive to ovarian cyst, ovarian torsion, acute appendicitis, urinary tract infection, peptic ulcer disease, bowel obstruction,  colitis (less likely given constipation rather than diarrhea), renal colic, gastroenteritis, etc.  Giving morphine and zofran and 1L NS IV bolus.   Clinical Course as of Apr 07 2034  Thu Apr 07, 2017  2005 No evidence of acute/emergent intraabdominal pathology. CT Abdomen Pelvis W Contrast [CF]  2032 I treated the patient with the CT results.  She continues to have intractable pain and nausea/vomiting.  She vomited up some of the oral contrast and was only able to tolerate one bottle.  She continues to be upset and complaining of severe pain and her inability to eat or drink anything.  We had a discussion and I offered both inpatient management versus outpatient follow-up, and she and her husband both feel that she will probably come back due to the pain and  vomiting and she is not home tonight.  I discussed the case with Dr. Bridgett Larsson who will admit.  [CF]    Clinical Course User Index [CF] Hinda Kehr, MD    ____________________________________________  FINAL CLINICAL IMPRESSION(S) / ED DIAGNOSES  Final diagnoses:  Generalized abdominal pain  Intractable pain  Intractable vomiting with nausea, unspecified vomiting type  Leukocytosis, unspecified type     MEDICATIONS GIVEN DURING THIS VISIT:  Medications  iopamidol (ISOVUE-300) 61 % injection 30 mL (not administered)  ondansetron (ZOFRAN) injection 4 mg (4 mg Intravenous Given 04/07/17 1813)  morphine 4 MG/ML injection 4 mg (4 mg Intravenous Given 04/07/17 1813)  sodium chloride 0.9 % bolus 1,000 mL (0 mLs Intravenous Stopped 04/07/17 1945)  iopamidol (ISOVUE-300) 61 % injection 30 mL (30 mLs Oral Contrast Given 04/07/17 1820)  iopamidol (ISOVUE-300) 61 % injection 100 mL (100 mLs Intravenous Contrast Given 04/07/17 1933)  ondansetron (ZOFRAN) injection 4 mg (4 mg Intravenous Given 04/07/17 2008)  morphine 4 MG/ML injection 4 mg (4 mg Intravenous Given 04/07/17 2035)     NEW OUTPATIENT MEDICATIONS STARTED DURING THIS VISIT:  New  Prescriptions   No medications on file    Modified Medications   No medications on file    Discontinued Medications   No medications on file     Note:  This document was prepared using Dragon voice recognition software and may include unintentional dictation errors.    Hinda Kehr, MD 04/07/17 2035

## 2017-04-07 NOTE — ED Notes (Signed)
Lab called for add on of urine preg

## 2017-04-07 NOTE — ED Triage Notes (Signed)
Patient comes in with complaints of nausea and vomiting.  Patient has vomited 3 times in 24 hour period.  Last normal BM 04/03/2017.  Patient has been taking amoxicillin since last Wednesday for "bacteria in my belly".

## 2017-04-08 DIAGNOSIS — K317 Polyp of stomach and duodenum: Secondary | ICD-10-CM | POA: Diagnosis not present

## 2017-04-08 DIAGNOSIS — K625 Hemorrhage of anus and rectum: Secondary | ICD-10-CM

## 2017-04-08 DIAGNOSIS — R1084 Generalized abdominal pain: Secondary | ICD-10-CM | POA: Diagnosis not present

## 2017-04-08 DIAGNOSIS — K2970 Gastritis, unspecified, without bleeding: Secondary | ICD-10-CM | POA: Diagnosis not present

## 2017-04-08 DIAGNOSIS — R52 Pain, unspecified: Secondary | ICD-10-CM | POA: Diagnosis not present

## 2017-04-08 DIAGNOSIS — K640 First degree hemorrhoids: Secondary | ICD-10-CM | POA: Diagnosis not present

## 2017-04-08 LAB — BASIC METABOLIC PANEL
ANION GAP: 6 (ref 5–15)
BUN: <5 mg/dL — ABNORMAL LOW (ref 6–20)
CALCIUM: 8.4 mg/dL — AB (ref 8.9–10.3)
CO2: 27 mmol/L (ref 22–32)
CREATININE: 0.55 mg/dL (ref 0.44–1.00)
Chloride: 110 mmol/L (ref 101–111)
GFR calc non Af Amer: >60 mL/min (ref >60)
Glucose, Bld: 100 mg/dL — ABNORMAL HIGH (ref 65–99)
Potassium: 3.9 mmol/L (ref 3.5–5.1)
SODIUM: 143 mmol/L (ref 135–145)

## 2017-04-08 LAB — URINE DRUG SCREEN, QUALITATIVE (ARMC ONLY)
Amphetamines, Ur Screen: NOT DETECTED
BARBITURATES, UR SCREEN: NOT DETECTED
BENZODIAZEPINE, UR SCRN: POSITIVE — AB
CANNABINOID 50 NG, UR ~~LOC~~: NOT DETECTED
Cocaine Metabolite,Ur ~~LOC~~: NOT DETECTED
MDMA (Ecstasy)Ur Screen: NOT DETECTED
Methadone Scn, Ur: NOT DETECTED
Opiate, Ur Screen: NOT DETECTED
Phencyclidine (PCP) Ur S: NOT DETECTED
TRICYCLIC, UR SCREEN: POSITIVE — AB

## 2017-04-08 LAB — CBC
HCT: 37.5 % (ref 35.0–47.0)
Hemoglobin: 13.1 g/dL (ref 12.0–16.0)
MCH: 28.9 pg (ref 26.0–34.0)
MCHC: 34.8 g/dL (ref 32.0–36.0)
MCV: 83.1 fL (ref 80.0–100.0)
PLATELETS: 198 10*3/uL (ref 150–440)
RBC: 4.52 MIL/uL (ref 3.80–5.20)
RDW: 13.9 % (ref 11.5–14.5)
WBC: 10.8 10*3/uL (ref 3.6–11.0)

## 2017-04-08 MED ORDER — PANTOPRAZOLE SODIUM 40 MG IV SOLR
40.0000 mg | Freq: Two times a day (BID) | INTRAVENOUS | Status: DC
  Administered 2017-04-08 – 2017-04-10 (×5): 40 mg via INTRAVENOUS
  Filled 2017-04-08 (×5): qty 40

## 2017-04-08 MED ORDER — TOPIRAMATE 100 MG PO TABS
100.0000 mg | ORAL_TABLET | Freq: Every day | ORAL | Status: DC
  Filled 2017-04-08 (×3): qty 1

## 2017-04-08 MED ORDER — DIAZEPAM 5 MG PO TABS: 5.0000 mg | ORAL_TABLET | Freq: Two times a day (BID) | ORAL | Status: DC | PRN

## 2017-04-08 MED ORDER — NORTRIPTYLINE HCL 25 MG PO CAPS
50.0000 mg | ORAL_CAPSULE | Freq: Every day | ORAL | Status: DC
Start: 2017-04-08 — End: 2017-04-10
  Filled 2017-04-08 (×3): qty 2

## 2017-04-08 MED ORDER — METOCLOPRAMIDE HCL 5 MG/ML IJ SOLN
10.0000 mg | Freq: Four times a day (QID) | INTRAMUSCULAR | Status: DC
  Administered 2017-04-08 – 2017-04-10 (×8): 10 mg via INTRAVENOUS
  Filled 2017-04-08 (×9): qty 2

## 2017-04-08 MED ORDER — LACTULOSE 10 GM/15ML PO SOLN
20.0000 g | Freq: Three times a day (TID) | ORAL | Status: DC
  Administered 2017-04-08 (×2): 20 g via ORAL
  Filled 2017-04-08 (×2): qty 30

## 2017-04-08 MED ORDER — AZELASTINE HCL 0.1 % NA SOLN
2.0000 | Freq: Two times a day (BID) | NASAL | Status: DC | PRN
  Filled 2017-04-08: qty 30

## 2017-04-08 MED ORDER — SODIUM CHLORIDE 0.9 % IV SOLN
INTRAVENOUS | Status: DC
  Administered 2017-04-10: 08:00:00 via INTRAVENOUS

## 2017-04-08 MED ORDER — MAGNESIUM CITRATE PO SOLN
1.0000 | Freq: Once | ORAL | Status: AC
  Administered 2017-04-08: 14:00:00 1 via ORAL
  Filled 2017-04-08: qty 296

## 2017-04-08 MED ORDER — HYDROMORPHONE HCL 1 MG/ML IJ SOLN
1.0000 mg | INTRAMUSCULAR | Status: DC | PRN
  Administered 2017-04-08: 1 mg via INTRAVENOUS
  Filled 2017-04-08: qty 1

## 2017-04-08 MED ORDER — PROMETHAZINE HCL 25 MG/ML IJ SOLN
25.0000 mg | Freq: Four times a day (QID) | INTRAMUSCULAR | Status: DC | PRN
  Administered 2017-04-08 – 2017-04-09 (×3): 25 mg via INTRAVENOUS
  Filled 2017-04-08 (×4): qty 1

## 2017-04-08 MED ORDER — DULOXETINE HCL 30 MG PO CPEP: 30.0000 mg | ORAL_CAPSULE | Freq: Every day | ORAL | Status: DC

## 2017-04-08 MED ORDER — PEG 3350-KCL-NA BICARB-NACL 420 G PO SOLR
4000.0000 mL | Freq: Once | ORAL | Status: AC
  Administered 2017-04-09: 17:00:00 4000 mL via ORAL
  Filled 2017-04-08: qty 4000

## 2017-04-08 NOTE — Consult Note (Signed)
Jonathon Bellows MD, MRCP(U.K) [facility]  [place], [place] [address]  Main: [phone]  Fax: [phone]  Consultation  Referring Provider:  Dr Posey Pronto Primary Care Physician:  Idelle Crouch, MD Primary Gastroenterologist:  Dr. Gustavo Lah         Reason for Consultation:     Rectal bleeding   Date of Admission:  04/07/2017 Date of Consultation:  04/08/2017         HPI:   Teresa Rhodes is a 46 y.o. female with a history of chronic constipation known to Dr Gustavo Lah at [facility], seen at their office, has been on [facility] . Last colonoscopy back in 2012. Admitted via the ER for abdominal pain and rectal bleeding. Hb 14.4 on admission . CT abdomen showed no acute abnormality.   She say she had similar issues 6 years back, had a colonoscopy but cant recall the result, probably she says had polyps. She says 2 weeks back had bright red blood in the toilet bowel for a week then stopped. Does not recall having had hard stools or straining . NoFamily history of colon cancer,She has also been having abdominal pain , non radiating , during her meals, lasting for an hour since a few weeks, last bowel movement a week back . Denies NSAID use. Brother had IBD. She recalls the last time she had a colonoscopy - she wasn't cleaned . She says finds it hard to eat at this time due to abdominal pain.     Past Medical History:  Diagnosis Date  . Complication of anesthesia    states she woke up during  neck surgery  . Depression   . Gallstones   . Migraine   . Seizures ([place]) 2010-2013   Brain seizures    Past Surgical History:  Procedure Laterality Date  . BACK SURGERY    . CHOLECYSTECTOMY  2002  . NECK SURGERY  2005 & 2009   x2    Prior to Admission medications   Medication Sig Start Date End Date Taking? Authorizing Provider  amitriptyline (ELAVIL) 50 MG tablet Take 50 mg by mouth at bedtime.    [provider]  DULoxetine (CYMBALTA) 60 MG capsule Take 60 mg by mouth  daily.    [provider]  estrogen-methylTESTOSTERone 0.625-1.25 MG tablet Take 1 tablet by mouth daily.    [provider]  fluticasone (FLONASE) 50 MCG/ACT nasal spray Place into both nostrils daily.    [provider]  hyoscyamine (ANASPAZ) 0.125 MG TBDP disintergrating tablet Place 0.125 mg under the tongue.    [provider]  nortriptyline (PAMELOR) 25 MG capsule Take 25 mg by mouth at bedtime.    [provider]  polyethylene glycol (MIRALAX / GLYCOLAX) packet Take 17 g by mouth daily. Patient not taking: Reported on 11/10/2016 09/14/15   Dara Lords, Alexzandrew L, PA-C  topiramate (TOPAMAX) 50 MG tablet Take 50 mg by mouth 2 (two) times daily.    [provider]    Family History  Problem Relation Age of Onset  . Ovarian cancer Mother   . Ulcerative colitis Mother   . Liver disease Maternal Grandfather   . Kidney disease Father 9  . Kidney disease Brother 36  . Colon cancer Neg Hx   . Colon polyps Neg Hx   . Esophageal cancer Neg Hx   . Kidney cancer Neg Hx   . Bladder Cancer Neg Hx      Social History  Substance Use Topics  .  Smoking status: Former Smoker    Packs/day: 0.50    Years: 3.00    Types: Cigarettes    Quit date: 10/24/2014  . Smokeless tobacco: Never Used     Comment: pt given handout  . Alcohol use No    Allergies as of 04/07/2017 - Review Complete 04/07/2017  Allergen Reaction Noted  . Robaxin [methocarbamol] Other (See Comments) 12/12/2014    Review of Systems:    All systems reviewed and negative except where noted in HPI.   Physical Exam:  Vital signs in last 24 hours: Temp:  [97.5 F (36.4 C)-98 F (36.7 C)] 98 F (36.7 C) (09/21 0544) Pulse Rate:  [78-99] 78 (09/21 0544) Resp:  [18-20] 20 (09/21 0544) BP: (117-143)/(69-96) 117/69 (09/21 0544) SpO2:  [97 %-100 %] 99 % (09/21 0544) Weight:  [238 lb (108 kg)] 238 lb (108 kg) (09/20 1633) Last BM Date: 04/04/17 General:   Pleasant,  cooperative in NAD Head:  Normocephalic and atraumatic. Eyes:   No icterus.   Conjunctiva pink. PERRLA. Ears:  Normal auditory acuity. Neck:  Supple; no masses or thyroidomegaly Lungs: Respirations even and unlabored. Lungs clear to auscultation bilaterally.   No wheezes, crackles, or rhonchi.  Heart:  Regular rate and rhythm;  Without murmur, clicks, rubs or gallops Abdomen:  Soft, nondistended, nontender. Normal bowel sounds. No appreciable masses or hepatomegaly.  No rebound or guarding.  Extremities:  Without edema, cyanosis or clubbing. Neurologic:  Alert and oriented x3;  grossly normal neurologically. Skin:  Intact without significant lesions or rashes. Cervical Nodes:  No significant cervical adenopathy. Psych:  Alert and cooperative. Normal affect.  LAB RESULTS:  Recent Labs  04/07/17 1633 04/08/17 0406  WBC 14.1* 10.8  HGB 14.4 13.1  HCT 42.0 37.5  PLT 244 198   BMET  Recent Labs  04/07/17 1633 04/08/17 0406  NA 139 143  K 3.6 3.9  CL 106 110  CO2 25 27  GLUCOSE 110* 100*  BUN <5* <5*  CREATININE 0.50 0.55  CALCIUM 9.3 8.4*   LFT  Recent Labs  04/07/17 1633  PROT 7.7  ALBUMIN 4.1  AST 27  ALT 30  ALKPHOS 90  BILITOT 0.6   PT/INR No results for input(s): LABPROT, INR in the last 72 hours.  STUDIES: Ct Abdomen Pelvis W Contrast  Result Date: 04/07/2017 CLINICAL DATA:  Epigastric pain with nausea and vomiting EXAM: CT ABDOMEN AND PELVIS WITH CONTRAST TECHNIQUE: Multidetector CT imaging of the abdomen and pelvis was performed using the standard protocol following bolus administration of intravenous contrast. CONTRAST:  164mL ISOVUE-300 IOPAMIDOL (ISOVUE-300) INJECTION 61% COMPARISON:  11/01/2014 FINDINGS: Lower chest: No acute abnormality. Hepatobiliary: Mild steatosis. Surgical clips at the gallbladder fossa. No biliary dilatation. No focal hepatic abnormality. Pancreas: Unremarkable. No pancreatic ductal dilatation or surrounding inflammatory changes.  Spleen: Normal in size without focal abnormality. Adrenals/Urinary Tract: Adrenal glands are unremarkable. Kidneys are normal, without renal calculi, focal lesion, or hydronephrosis. Bladder is unremarkable. Stomach/Bowel: Stomach is within normal limits. Appendix appears normal. No evidence of bowel wall thickening, distention, or inflammatory changes. Vascular/Lymphatic: No significant vascular findings are present. No enlarged abdominal or pelvic lymph nodes. Reproductive: Uterus and bilateral adnexa are unremarkable. Other: Negative for free air or free fluid. Fat containing umbilical hernia. Musculoskeletal: Posterior stabilization rods and fixating screws at L4-L5 with interbody device. IMPRESSION: 1. No CT evidence for acute intra-abdominal or pelvic pathology. 2. Mild steatosis Electronically Signed   By: Donavan Foil M.D.   On: 04/07/2017 19:55  Impression / Plan:   Teresa Rhodes is a 46 y.o. y/o female with history of constipation in the past admitted with abdominal pain, rectal bleeding. Hb has been stable. CT abdomen shows no acute abnormality. F/h of crohns disease , poor bowel prep last colonoscopy   Plan  1. Stool H pylori antigen  2. EGD+colonoscopy for Sunday morning. Will plan for a 2 day prep . Clears today , 1 dose magnesium citrate today as she has not had a bowel movement in 5-7 days. Golytely tomorrow with another dose of magnesium citrate ( I will place orders) . Will plan to r/o causes for rectal bleeding including hemorrhoids, polyps, neoplasm, IBD 3. PPI  I have discussed alternative options, risks & benefits,  which include, but are not limited to, bleeding, infection, perforation,respiratory complication & drug reaction.  The patient agrees with this plan & written consent will be obtained.     Thank you for involving me in the care of this patient.      LOS: 0 days   Jonathon Bellows, MD  04/08/2017, 10:46 AM

## 2017-04-08 NOTE — Progress Notes (Signed)
[place] at [facility]                                                                                                                                                                                  Patient Demographics   Teresa Rhodes, is a 46 y.o. female, DOB - 1970-12-22, LFY:[id number]  Admit date - 04/07/2017   Admitting Physician Demetrios Loll, MD  Outpatient Primary MD for the patient is Idelle Crouch, MD   LOS - 0  Subjective:Patient admitted with abdominal pain nausea and vomiting. Continues to have symptoms.     Review of Systems:   CONSTITUTIONAL: No documented fever. No fatigue, weakness. No weight gain, no weight loss.  EYES: No blurry or double vision.  ENT: No tinnitus. No postnasal drip. No redness of the oropharynx.  RESPIRATORY: No cough, no wheeze, no hemoptysis. No dyspnea.  CARDIOVASCULAR: No chest pain. No orthopnea. No palpitations. No syncope.  GASTROINTESTINAL: Positive nausea, positive vomiting or diarrhea. Positive abdominal pain. No melena or hematochezia.  GENITOURINARY: No dysuria or hematuria.  ENDOCRINE: No polyuria or nocturia. No heat or cold intolerance.  HEMATOLOGY: No anemia. No bruising. No bleeding.  INTEGUMENTARY: No rashes. No lesions.  MUSCULOSKELETAL: No arthritis. No swelling. No gout.  NEUROLOGIC: No numbness, tingling, or ataxia. No seizure-type activity.  PSYCHIATRIC: No anxiety. No insomnia. No ADD.    Vitals:   Vitals:   04/07/17 2107 04/07/17 2133 04/08/17 0544 04/08/17 1315  BP: (!) 142/96 (!) 143/76 117/69 132/77  Pulse: 86 81 78 78  Resp: 18 20 20    Temp:  97.7 F (36.5 C) 98 F (36.7 C) 97.6 F (36.4 C)  TempSrc:  Oral Oral Oral  SpO2: 97% 100% 99% 98%  Weight:      Height:        Wt Readings from Last 3 Encounters:  04/07/17 238 lb (108 kg)  11/11/16 239 lb (108.4 kg)  11/10/16 239 lb 11.2 oz (108.7 kg)     Intake/Output Summary (Last 24 hours) at 04/08/17 1356 Last data  filed at 04/08/17 0500  Gross per 24 hour  Intake          1516.67 ml  Output                0 ml  Net          1516.67 ml    Physical Exam:   GENERAL: Pleasant-appearing in no apparent distress.  HEAD, EYES, EARS, NOSE AND THROAT: Atraumatic, normocephalic. Extraocular muscles are intact. Pupils equal and reactive to light. Sclerae anicteric. No conjunctival injection. No oro-pharyngeal erythema.  NECK: Supple. There is no jugular venous distention. No  bruits, no lymphadenopathy, no thyromegaly.  HEART: Regular rate and rhythm,. No murmurs, no rubs, no clicks.  LUNGS: Clear to auscultation bilaterally. No rales or rhonchi. No wheezes.  ABDOMEN: Soft, flat, positive epigastric tenderness, nondistended. Has good bowel sounds. No hepatosplenomegaly appreciated.  EXTREMITIES: No evidence of any cyanosis, clubbing, or peripheral edema.  +2 pedal and radial pulses bilaterally.  NEUROLOGIC: The patient is alert, awake, and oriented x3 with no focal motor or sensory deficits appreciated bilaterally.  SKIN: Moist and warm with no rashes appreciated.  Psych: Not anxious, depressed LN: No inguinal LN enlargement    Antibiotics   Anti-infectives    None      Medications   Scheduled Meds: . amitriptyline  50 mg Oral QHS  . DULoxetine  60 mg Oral Daily  . enoxaparin (LOVENOX) injection  40 mg Subcutaneous Q24H  . estrogen-methylTESTOSTERone  1 tablet Oral Daily  . fluticasone  1 spray Each Nare Daily  . lactulose  20 g Oral TID  . metoCLOPramide (REGLAN) injection  10 mg Intravenous Q6H  . pantoprazole (PROTONIX) IV  40 mg Intravenous Q12H  . [START ON 04/09/2017] polyethylene glycol-electrolytes  4,000 mL Oral Once  . topiramate  50 mg Oral BID   Continuous Infusions: . sodium chloride    . 0.9 % NaCl with KCl 20 mEq / L 100 mL/hr at 04/07/17 2350   PRN Meds:.acetaminophen **OR** acetaminophen, albuterol, bisacodyl, HYDROcodone-acetaminophen, HYDROmorphone (DILAUDID) injection,  ketorolac, ondansetron **OR** ondansetron (ZOFRAN) IV, promethazine, senna-docusate   Data Review:   Micro Results No results found for this or any previous visit (from the past 240 hour(s)).  Radiology Reports Ct Abdomen Pelvis W Contrast  Result Date: 04/07/2017 CLINICAL DATA:  Epigastric pain with nausea and vomiting EXAM: CT ABDOMEN AND PELVIS WITH CONTRAST TECHNIQUE: Multidetector CT imaging of the abdomen and pelvis was performed using the standard protocol following bolus administration of intravenous contrast. CONTRAST:  167mL ISOVUE-300 IOPAMIDOL (ISOVUE-300) INJECTION 61% COMPARISON:  11/01/2014 FINDINGS: Lower chest: No acute abnormality. Hepatobiliary: Mild steatosis. Surgical clips at the gallbladder fossa. No biliary dilatation. No focal hepatic abnormality. Pancreas: Unremarkable. No pancreatic ductal dilatation or surrounding inflammatory changes. Spleen: Normal in size without focal abnormality. Adrenals/Urinary Tract: Adrenal glands are unremarkable. Kidneys are normal, without renal calculi, focal lesion, or hydronephrosis. Bladder is unremarkable. Stomach/Bowel: Stomach is within normal limits. Appendix appears normal. No evidence of bowel wall thickening, distention, or inflammatory changes. Vascular/Lymphatic: No significant vascular findings are present. No enlarged abdominal or pelvic lymph nodes. Reproductive: Uterus and bilateral adnexa are unremarkable. Other: Negative for free air or free fluid. Fat containing umbilical hernia. Musculoskeletal: Posterior stabilization rods and fixating screws at L4-L5 with interbody device. IMPRESSION: 1. No CT evidence for acute intra-abdominal or pelvic pathology. 2. Mild steatosis Electronically Signed   By: Donavan Foil M.D.   On: 04/07/2017 19:55     CBC  Recent Labs Lab 04/07/17 1633 04/08/17 0406  WBC 14.1* 10.8  HGB 14.4 13.1  HCT 42.0 37.5  PLT 244 198  MCV 82.7 83.1  MCH 28.3 28.9  MCHC 34.2 34.8  RDW 13.8 13.9     Chemistries   Recent Labs Lab 04/07/17 1633 04/08/17 0406  NA 139 143  K 3.6 3.9  CL 106 110  CO2 25 27  GLUCOSE 110* 100*  BUN <5* <5*  CREATININE 0.50 0.55  CALCIUM 9.3 8.4*  AST 27  --   ALT 30  --   ALKPHOS 90  --   BILITOT  0.6  --    ------------------------------------------------------------------------------------------------------------------ estimated creatinine clearance is 117.4 mL/min (by C-G formula based on SCr of 0.55 mg/dL). ------------------------------------------------------------------------------------------------------------------ No results for input(s): HGBA1C in the last 72 hours. ------------------------------------------------------------------------------------------------------------------ No results for input(s): CHOL, HDL, LDLCALC, TRIG, CHOLHDL, LDLDIRECT in the last 72 hours. ------------------------------------------------------------------------------------------------------------------ No results for input(s): TSH, T4TOTAL, T3FREE, THYROIDAB in the last 72 hours.  Invalid input(s): FREET3 ------------------------------------------------------------------------------------------------------------------ No results for input(s): VITAMINB12, FOLATE, FERRITIN, TIBC, IRON, RETICCTPCT in the last 72 hours.  Coagulation profile No results for input(s): INR, PROTIME in the last 168 hours.  No results for input(s): DDIMER in the last 72 hours.  Cardiac Enzymes No results for input(s): CKMB, TROPONINI, MYOGLOBIN in the last 168 hours.  Invalid input(s): CK ------------------------------------------------------------------------------------------------------------------ Invalid input(s): [place]  Patient is 46 year old presenting with intractable nausea vomiting and abdominal pain  1. Intractable abdominal pain with nausea and vomiting GI consult appreciated plan for colonoscopy on Sunday I will start patient on IV  Reglan I will place patient on IV Protonix twice a day We will change her pain medications  2. Leukocytosis possibly reactive normal WBC count now  3. History of migraines  4. Depression continue therapy with Elavil and Cymbalta         Code Status Orders        Start     Ordered   04/07/17 2139  Full code  Continuous     09 /20/18 2138    Code Status History    Date Active Date Inactive Code Status Order ID Comments User Context   This patient has a current code status but no historical code status.           Consults  GI  DVT Prophylaxis  Lovenox for DVT prophylaxis  Lab Results  Component Value Date   PLT 198 04/08/2017     Time Spent in minutes   35 minutes   Greater than 50% of time spent in care coordination and counseling patient regarding the condition and plan of care.   Dustin Flock M.D on 04/08/2017 at 1:56 PM  Between 7am to 6pm - Pager - [phone]  After 6pm go to [email or web address] - password [email or web address] [facility]   Office  [phone]

## 2017-04-09 ENCOUNTER — Encounter: Payer: Self-pay | Admitting: Anesthesiology

## 2017-04-09 DIAGNOSIS — R52 Pain, unspecified: Secondary | ICD-10-CM

## 2017-04-09 LAB — HIV ANTIBODY (ROUTINE TESTING W REFLEX): HIV Screen 4th Generation wRfx: NONREACTIVE

## 2017-04-09 NOTE — Progress Notes (Signed)
[place] at [facility]                                                                                                                                                                                  Patient Demographics   Teresa Rhodes, is a 46 y.o. female, DOB - 09/13/1970, LFY:[id number]  Admit date - 04/07/2017   Admitting Physician Demetrios Loll, MD  Outpatient Primary MD for the patient is Idelle Crouch, MD   LOS - 0  Subjective: Changes to have abdominal pain and nausea Has been having loose bowel movements with bowel regimen    Review of Systems:   CONSTITUTIONAL: No documented fever. No fatigue, weakness. No weight gain, no weight loss.  EYES: No blurry or double vision.  ENT: No tinnitus. No postnasal drip. No redness of the oropharynx.  RESPIRATORY: No cough, no wheeze, no hemoptysis. No dyspnea.  CARDIOVASCULAR: No chest pain. No orthopnea. No palpitations. No syncope.  GASTROINTESTINAL: Positive nausea, positive vomiting or diarrhea. Positive abdominal pain. No melena or hematochezia.  GENITOURINARY: No dysuria or hematuria.  ENDOCRINE: No polyuria or nocturia. No heat or cold intolerance.  HEMATOLOGY: No anemia. No bruising. No bleeding.  INTEGUMENTARY: No rashes. No lesions.  MUSCULOSKELETAL: No arthritis. No swelling. No gout.  NEUROLOGIC: No numbness, tingling, or ataxia. No seizure-type activity.  PSYCHIATRIC: No anxiety. No insomnia. No ADD.    Vitals:   Vitals:   04/08/17 0544 04/08/17 1315 04/08/17 2143 04/09/17 0452  BP: 117/69 132/77 127/67 107/63  Pulse: 78 78 85 71  Resp: 20  20 20   Temp: 98 F (36.7 C) 97.6 F (36.4 C) (!) 96.9 F (36.1 C) 97.9 F (36.6 C)  TempSrc: Oral Oral Axillary Oral  SpO2: 99% 98% 100% 98%  Weight:      Height:        Wt Readings from Last 3 Encounters:  04/07/17 238 lb (108 kg)  11/11/16 239 lb (108.4 kg)  11/10/16 239 lb 11.2 oz (108.7 kg)     Intake/Output Summary (Last 24  hours) at 04/09/17 1336 Last data filed at 04/09/17 0900  Gross per 24 hour  Intake             3555 ml  Output              500 ml  Net             3055 ml    Physical Exam:   GENERAL: Pleasant-appearing in no apparent distress.  HEAD, EYES, EARS, NOSE AND THROAT: Atraumatic, normocephalic. Extraocular muscles are intact. Pupils equal and reactive to light. Sclerae anicteric. No conjunctival injection. No oro-pharyngeal erythema.  NECK: Supple. There is no jugular venous distention. No bruits, no lymphadenopathy, no thyromegaly.  HEART: Regular rate and rhythm,. No murmurs, no rubs, no clicks.  LUNGS: Clear to auscultation bilaterally. No rales or rhonchi. No wheezes.  ABDOMEN: Soft, flat, positive epigastric tenderness, nondistended. Has good bowel sounds. No hepatosplenomegaly appreciated.  EXTREMITIES: No evidence of any cyanosis, clubbing, or peripheral edema.  +2 pedal and radial pulses bilaterally.  NEUROLOGIC: The patient is alert, awake, and oriented x3 with no focal motor or sensory deficits appreciated bilaterally.  SKIN: Moist and warm with no rashes appreciated.  Psych: Not anxious, depressed LN: No inguinal LN enlargement    Antibiotics   Anti-infectives    None      Medications   Scheduled Meds: . DULoxetine  30 mg Oral QHS  . enoxaparin (LOVENOX) injection  40 mg Subcutaneous Q24H  . metoCLOPramide (REGLAN) injection  10 mg Intravenous Q6H  . nortriptyline  50 mg Oral QHS  . pantoprazole (PROTONIX) IV  40 mg Intravenous Q12H  . polyethylene glycol-electrolytes  4,000 mL Oral Once  . topiramate  100 mg Oral QHS   Continuous Infusions: . sodium chloride    . 0.9 % NaCl with KCl 20 mEq / L 100 mL/hr at 04/09/17 0616   PRN Meds:.acetaminophen **OR** acetaminophen, albuterol, azelastine, bisacodyl, diazepam, HYDROcodone-acetaminophen, HYDROmorphone (DILAUDID) injection, ketorolac, ondansetron **OR** ondansetron (ZOFRAN) IV, promethazine,  senna-docusate   Data Review:   Micro Results No results found for this or any previous visit (from the past 240 hour(s)).  Radiology Reports Ct Abdomen Pelvis W Contrast  Result Date: 04/07/2017 CLINICAL DATA:  Epigastric pain with nausea and vomiting EXAM: CT ABDOMEN AND PELVIS WITH CONTRAST TECHNIQUE: Multidetector CT imaging of the abdomen and pelvis was performed using the standard protocol following bolus administration of intravenous contrast. CONTRAST:  1104mL ISOVUE-300 IOPAMIDOL (ISOVUE-300) INJECTION 61% COMPARISON:  11/01/2014 FINDINGS: Lower chest: No acute abnormality. Hepatobiliary: Mild steatosis. Surgical clips at the gallbladder fossa. No biliary dilatation. No focal hepatic abnormality. Pancreas: Unremarkable. No pancreatic ductal dilatation or surrounding inflammatory changes. Spleen: Normal in size without focal abnormality. Adrenals/Urinary Tract: Adrenal glands are unremarkable. Kidneys are normal, without renal calculi, focal lesion, or hydronephrosis. Bladder is unremarkable. Stomach/Bowel: Stomach is within normal limits. Appendix appears normal. No evidence of bowel wall thickening, distention, or inflammatory changes. Vascular/Lymphatic: No significant vascular findings are present. No enlarged abdominal or pelvic lymph nodes. Reproductive: Uterus and bilateral adnexa are unremarkable. Other: Negative for free air or free fluid. Fat containing umbilical hernia. Musculoskeletal: Posterior stabilization rods and fixating screws at L4-L5 with interbody device. IMPRESSION: 1. No CT evidence for acute intra-abdominal or pelvic pathology. 2. Mild steatosis Electronically Signed   By: Donavan Foil M.D.   On: 04/07/2017 19:55     CBC  Recent Labs Lab 04/07/17 1633 04/08/17 0406  WBC 14.1* 10.8  HGB 14.4 13.1  HCT 42.0 37.5  PLT 244 198  MCV 82.7 83.1  MCH 28.3 28.9  MCHC 34.2 34.8  RDW 13.8 13.9    Chemistries   Recent Labs Lab 04/07/17 1633 04/08/17 0406  NA  139 143  K 3.6 3.9  CL 106 110  CO2 25 27  GLUCOSE 110* 100*  BUN <5* <5*  CREATININE 0.50 0.55  CALCIUM 9.3 8.4*  AST 27  --   ALT 30  --   ALKPHOS 90  --   BILITOT 0.6  --    ------------------------------------------------------------------------------------------------------------------ estimated creatinine clearance is 117.4 mL/min (by C-G formula based  on SCr of 0.55 mg/dL). ------------------------------------------------------------------------------------------------------------------ No results for input(s): HGBA1C in the last 72 hours. ------------------------------------------------------------------------------------------------------------------ No results for input(s): CHOL, HDL, LDLCALC, TRIG, CHOLHDL, LDLDIRECT in the last 72 hours. ------------------------------------------------------------------------------------------------------------------ No results for input(s): TSH, T4TOTAL, T3FREE, THYROIDAB in the last 72 hours.  Invalid input(s): FREET3 ------------------------------------------------------------------------------------------------------------------ No results for input(s): VITAMINB12, FOLATE, FERRITIN, TIBC, IRON, RETICCTPCT in the last 72 hours.  Coagulation profile No results for input(s): INR, PROTIME in the last 168 hours.  No results for input(s): DDIMER in the last 72 hours.  Cardiac Enzymes No results for input(s): CKMB, TROPONINI, MYOGLOBIN in the last 168 hours.  Invalid input(s): CK ------------------------------------------------------------------------------------------------------------------ Invalid input(s): [place]  Patient is 46 year old presenting with intractable nausea vomiting and abdominal pain  1. Intractable abdominal pain with nausea and vomiting GI consult appreciated plan for colonoscopy on Sunday Continue IV Reglan continue IV Protonix twice a day continue pain medications  2. Leukocytosis  possibly reactive normal WBC count now  3. History of migraines  4. Depression continue therapy with Elavil and Cymbalta         Code Status Orders        Start     Ordered   04/07/17 2139  Full code  Continuous     09 /20/18 2138    Code Status History    Date Active Date Inactive Code Status Order ID Comments User Context   This patient has a current code status but no historical code status.           Consults  GI  DVT Prophylaxis  Lovenox for DVT prophylaxis  Lab Results  Component Value Date   PLT 198 04/08/2017     Time Spent in minutes   32 minutes   Greater than 50% of time spent in care coordination and counseling patient regarding the condition and plan of care.   Dustin Flock M.D on 04/09/2017 at 1:36 PM  Between 7am to 6pm - Pager - [phone]  After 6pm go to [email or web address] - password [email or web address] [facility]   Office  [phone]

## 2017-04-09 NOTE — Progress Notes (Signed)
Teresa Bellows MD, MRCP(U.K) [address]  [place]  [place], [place] [address]  Main: [phone]    Teresa Rhodes is being followed for abdominal pain , rectal bleeding   Subjective: No complaints    Objective: Vital signs in last 24 hours: Vitals:   04/08/17 0544 04/08/17 1315 04/08/17 2143 04/09/17 0452  BP: 117/69 132/77 127/67 107/63  Pulse: 78 78 85 71  Resp: 20  20 20   Temp: 98 F (36.7 C) 97.6 F (36.4 C) (!) 96.9 F (36.1 C) 97.9 F (36.6 C)  TempSrc: Oral Oral Axillary Oral  SpO2: 99% 98% 100% 98%  Weight:      Height:       Weight change:   Intake/Output Summary (Last 24 hours) at 04/09/17 1559 Last data filed at 04/09/17 0900  Gross per 24 hour  Intake             3555 ml  Output              500 ml  Net             3055 ml     Exam: Heart:: Regular rate and rhythm, S1S2 present or without murmur or extra heart sounds Lungs: normal, clear to auscultation and clear to auscultation and percussion Abdomen: soft, nontender, normal bowel sounds   Lab Results: @LABTEST2 @ Micro Results: No results found for this or any previous visit (from the past 240 hour(s)). Studies/Results: Ct Abdomen Pelvis W Contrast  Result Date: 04/07/2017 CLINICAL DATA:  Epigastric pain with nausea and vomiting EXAM: CT ABDOMEN AND PELVIS WITH CONTRAST TECHNIQUE: Multidetector CT imaging of the abdomen and pelvis was performed using the standard protocol following bolus administration of intravenous contrast. CONTRAST:  114mL ISOVUE-300 IOPAMIDOL (ISOVUE-300) INJECTION 61% COMPARISON:  11/01/2014 FINDINGS: Lower chest: No acute abnormality. Hepatobiliary: Mild steatosis. Surgical clips at the gallbladder fossa. No biliary dilatation. No focal hepatic abnormality. Pancreas: Unremarkable. No pancreatic ductal dilatation or surrounding inflammatory changes. Spleen: Normal in size without focal abnormality. Adrenals/Urinary Tract: Adrenal glands are unremarkable. Kidneys  are normal, without renal calculi, focal lesion, or hydronephrosis. Bladder is unremarkable. Stomach/Bowel: Stomach is within normal limits. Appendix appears normal. No evidence of bowel wall thickening, distention, or inflammatory changes. Vascular/Lymphatic: No significant vascular findings are present. No enlarged abdominal or pelvic lymph nodes. Reproductive: Uterus and bilateral adnexa are unremarkable. Other: Negative for free air or free fluid. Fat containing umbilical hernia. Musculoskeletal: Posterior stabilization rods and fixating screws at L4-L5 with interbody device. IMPRESSION: 1. No CT evidence for acute intra-abdominal or pelvic pathology. 2. Mild steatosis Electronically Signed   By: Donavan Foil M.D.   On: 04/07/2017 19:55   Medications: I have reviewed the patient's current medications. Scheduled Meds: . DULoxetine  30 mg Oral QHS  . enoxaparin (LOVENOX) injection  40 mg Subcutaneous Q24H  . metoCLOPramide (REGLAN) injection  10 mg Intravenous Q6H  . nortriptyline  50 mg Oral QHS  . pantoprazole (PROTONIX) IV  40 mg Intravenous Q12H  . polyethylene glycol-electrolytes  4,000 mL Oral Once  . topiramate  100 mg Oral QHS   Continuous Infusions: . sodium chloride    . 0.9 % NaCl with KCl 20 mEq / L 100 mL/hr at 04/09/17 0616   PRN Meds:.acetaminophen **OR** acetaminophen, albuterol, azelastine, bisacodyl, diazepam, HYDROcodone-acetaminophen, HYDROmorphone (DILAUDID) injection, ketorolac, ondansetron **OR** ondansetron (ZOFRAN) IV, promethazine, senna-docusate   Assessment: Active Problems:   Nausea & vomiting  Teresa Rhodes is a 46 y.o.  y/o female with history of constipation in the past admitted with abdominal pain, rectal bleeding. Hb has been stable. CT abdomen shows no acute abnormality. F/h of crohns disease , poor bowel prep last colonoscopy   Plan  1. Stool H pylori antigen  2. EGD+colonoscopy tomorrow . Will plan to r/o causes for rectal bleeding including  hemorrhoids, polyps, neoplasm, IBD 3. PPI    LOS: 0 days   Teresa Rhodes 04/09/2017, 3:59 PM

## 2017-04-10 ENCOUNTER — Encounter
Admission: EM | Disposition: A | Discharge status: Home / Self Care | Payer: Self-pay | Source: Home / Self Care | Attending: Emergency Medicine

## 2017-04-10 ENCOUNTER — Encounter: Payer: Self-pay | Admitting: *Deleted

## 2017-04-10 ENCOUNTER — Observation Stay: Payer: Commercial Managed Care - HMO | Admitting: Anesthesiology

## 2017-04-10 DIAGNOSIS — D72829 Elevated white blood cell count, unspecified: Secondary | ICD-10-CM | POA: Diagnosis not present

## 2017-04-10 DIAGNOSIS — R1013 Epigastric pain: Secondary | ICD-10-CM | POA: Diagnosis not present

## 2017-04-10 DIAGNOSIS — K2970 Gastritis, unspecified, without bleeding: Secondary | ICD-10-CM

## 2017-04-10 DIAGNOSIS — R1084 Generalized abdominal pain: Secondary | ICD-10-CM | POA: Diagnosis not present

## 2017-04-10 DIAGNOSIS — K640 First degree hemorrhoids: Secondary | ICD-10-CM

## 2017-04-10 DIAGNOSIS — K2960 Other gastritis without bleeding: Secondary | ICD-10-CM | POA: Diagnosis not present

## 2017-04-10 DIAGNOSIS — K317 Polyp of stomach and duodenum: Secondary | ICD-10-CM

## 2017-04-10 DIAGNOSIS — K625 Hemorrhage of anus and rectum: Secondary | ICD-10-CM | POA: Diagnosis not present

## 2017-04-10 DIAGNOSIS — K648 Other hemorrhoids: Secondary | ICD-10-CM | POA: Diagnosis not present

## 2017-04-10 DIAGNOSIS — R112 Nausea with vomiting, unspecified: Secondary | ICD-10-CM | POA: Diagnosis not present

## 2017-04-10 HISTORY — PX: ESOPHAGOGASTRODUODENOSCOPY (EGD) WITH PROPOFOL: SHX5813

## 2017-04-10 HISTORY — PX: COLONOSCOPY WITH PROPOFOL: SHX5780

## 2017-04-10 SURGERY — ESOPHAGOGASTRODUODENOSCOPY (EGD) WITH PROPOFOL
Anesthesia: General

## 2017-04-10 MED ORDER — PROPOFOL 10 MG/ML IV BOLUS
INTRAVENOUS | Status: AC
  Filled 2017-04-10: qty 20

## 2017-04-10 MED ORDER — ONDANSETRON HCL 4 MG PO TABS: 4.0000 mg | ORAL_TABLET | Freq: Four times a day (QID) | ORAL | 0 refills | Status: DC | PRN

## 2017-04-10 MED ORDER — HYDROCODONE-ACETAMINOPHEN 5-325 MG PO TABS: 1.0000 | ORAL_TABLET | ORAL | 0 refills | Status: DC | PRN

## 2017-04-10 MED ORDER — OMEPRAZOLE 40 MG PO CPDR: 40.0000 mg | DELAYED_RELEASE_CAPSULE | Freq: Two times a day (BID) | ORAL | 0 refills | Status: DC

## 2017-04-10 MED ORDER — GLYCOPYRROLATE 0.2 MG/ML IJ SOLN
INTRAMUSCULAR | Status: AC
  Filled 2017-04-10: qty 1

## 2017-04-10 MED ORDER — PROPOFOL 10 MG/ML IV BOLUS
INTRAVENOUS | Status: DC | PRN
  Administered 2017-04-10: 50 mg via INTRAVENOUS
  Administered 2017-04-10: 20 mg via INTRAVENOUS
  Administered 2017-04-10: 30 mg via INTRAVENOUS

## 2017-04-10 MED ORDER — LACTATED RINGERS IV SOLN
INTRAVENOUS | Status: DC | PRN
  Administered 2017-04-10: 08:00:00 via INTRAVENOUS

## 2017-04-10 MED ORDER — LIDOCAINE HCL (PF) 2 % IJ SOLN
INTRAMUSCULAR | Status: AC
  Filled 2017-04-10: qty 2

## 2017-04-10 MED ORDER — PROPOFOL 500 MG/50ML IV EMUL
INTRAVENOUS | Status: AC
  Filled 2017-04-10: qty 50

## 2017-04-10 MED ORDER — PROPOFOL 500 MG/50ML IV EMUL
INTRAVENOUS | Status: DC | PRN
  Administered 2017-04-10: 150 ug/kg/min via INTRAVENOUS

## 2017-04-10 MED ORDER — LIDOCAINE HCL (CARDIAC) 20 MG/ML IV SOLN
INTRAVENOUS | Status: DC | PRN
  Administered 2017-04-10: 40 mg via INTRAVENOUS

## 2017-04-10 MED ORDER — GLYCOPYRROLATE 0.2 MG/ML IJ SOLN
INTRAMUSCULAR | Status: DC | PRN
  Administered 2017-04-10: 0.2 mg via INTRAVENOUS

## 2017-04-10 NOTE — Anesthesia Procedure Notes (Signed)
Date/Time: 04/10/2017 8:03 AM Performed by: Darlyne Russian Pre-anesthesia Checklist: Patient identified, Emergency Drugs available, Suction available, Patient being monitored and Timeout performed Patient Re-evaluated:Patient Re-evaluated prior to induction Oxygen Delivery Method: Nasal cannula Placement Confirmation: positive ETCO2

## 2017-04-10 NOTE — Transfer of Care (Signed)
Immediate Anesthesia Transfer of Care Note  Patient: Teresa Rhodes  Procedure(s) Performed: Procedure(s): ESOPHAGOGASTRODUODENOSCOPY (EGD) WITH PROPOFOL (N/A) COLONOSCOPY WITH PROPOFOL (N/A)  Patient Location: PACU  Anesthesia Type:General  Level of Consciousness: drowsy and patient cooperative  Airway & Oxygen Therapy: Patient Spontanous Breathing and Patient connected to nasal cannula oxygen  Post-op Assessment: Report given to RN and Post -op Vital signs reviewed and stable  Post vital signs: Reviewed and stable  Last Vitals:  Vitals:   04/10/17 0430 04/10/17 0840  BP: 118/62 127/80  Pulse: 74 78  Resp: 20 16  Temp: 36.8 C   SpO2: 98% 100%    Last Pain:  Vitals:   04/10/17 0840  TempSrc: Temporal  PainSc: 0-No pain         Complications: No apparent anesthesia complications

## 2017-04-10 NOTE — Anesthesia Post-op Follow-up Note (Signed)
Anesthesia QCDR form completed.        

## 2017-04-10 NOTE — Progress Notes (Signed)
IV access previously removed. D/C instructions and new medications reviewed with patient. Patient request prescription for nausea, for home use. Md contacted. MD stated he would send prescription to patient's pharmacy. Patient and husband verbalized understanding of instructions and declined further information. RN transported patient to personal vehicle, via wheelchair.

## 2017-04-10 NOTE — H&P (Signed)
Jonathon Bellows MD [address] [place], [place] [address] Phone: [phone] Fax : [phone]  Primary Care Physician:  Idelle Crouch, MD Primary Gastroenterologist:  Dr. Jonathon Bellows   Pre-Procedure History & Physical: HPI:  Teresa Rhodes is a 46 y.o. female is here for an endoscopy and colonoscopy.   Past Medical History:  Diagnosis Date  . Complication of anesthesia    states she woke up during  neck surgery  . Depression   . Gallstones   . Migraine   . Seizures ([place]) 2010-2013   Brain seizures    Past Surgical History:  Procedure Laterality Date  . BACK SURGERY    . CHOLECYSTECTOMY  2002  . NECK SURGERY  2005 & 2009   x2    Prior to Admission medications   Medication Sig Start Date End Date Taking? Authorizing Provider  azelastine (ASTELIN) 0.1 % nasal spray Place 2 sprays into both nostrils every 12 (twelve) hours as needed for rhinitis or allergies.    Yes [provider]  diazepam (VALIUM) 5 MG tablet Take 5 mg by mouth every 12 (twelve) hours as needed for anxiety.    Yes [provider]  DULoxetine (CYMBALTA) 30 MG capsule Take 30 mg by mouth at bedtime.   Yes [provider]  nortriptyline (PAMELOR) 50 MG capsule Take 50 mg by mouth at bedtime.   Yes [provider]  omeprazole (PRILOSEC) 40 MG capsule Take 40 mg by mouth daily.   Yes [provider]  topiramate (TOPAMAX) 50 MG tablet Take 100 mg by mouth at bedtime.   Yes [provider]    Allergies as of 04/07/2017 - Review Complete 04/07/2017  Allergen Reaction Noted  . Robaxin [methocarbamol] Other (See Comments) 12/12/2014    Family History  Problem Relation Age of Onset  . Ovarian cancer Mother   . Ulcerative colitis Mother   . Liver disease Maternal Grandfather   . Kidney disease Father 32  . Kidney disease Brother 3  . Colon cancer Neg Hx   . Colon polyps Neg Hx   . Esophageal cancer Neg Hx   . Kidney cancer Neg Hx   .  Bladder Cancer Neg Hx     Social History   Social History  . Marital status: Married    Spouse name: N/A  . Number of children: 1  . Years of education: N/A   Occupational History  . Civil engineer, contracting    Social History Main Topics  . Smoking status: Former Smoker    Packs/day: 0.50    Years: 3.00    Types: Cigarettes    Quit date: 10/24/2014  . Smokeless tobacco: Never Used     Comment: pt given handout  . Alcohol use No  . Drug use: No  . Sexual activity: Yes    Birth control/ protection: None   Other Topics Concern  . Not on file   Social History Narrative  . No narrative on file    Review of Systems: See HPI, otherwise negative ROS  Physical Exam: BP 118/62 (BP Location: Left Arm)   Pulse 74   Temp 98.3 F (36.8 C) (Oral)   Resp 20   Ht 5\' 9"  (1.753 m)   Wt 238 lb (108 kg)   SpO2 98%   BMI 35.15 kg/m  General:   Alert,  pleasant and cooperative in NAD Head:  Normocephalic and atraumatic. Neck:  Supple; no masses or thyromegaly. Lungs:  Clear throughout to auscultation.  Heart:  Regular rate and rhythm. Abdomen:  Soft, nontender and nondistended. Normal bowel sounds, without guarding, and without rebound.   Neurologic:  Alert and  oriented x4;  grossly normal neurologically.  Impression/Plan: Teresa Rhodes is here for an endoscopy and colonoscopy to be performed for abdominal pain and rectal bleeding   Risks, benefits, limitations, and alternatives regarding  endoscopy and colonoscopy have been reviewed with the patient.  Questions have been answered.  All parties agreeable.   Jonathon Bellows, MD  04/10/2017, 8:08 AM

## 2017-04-10 NOTE — Progress Notes (Signed)
[place] [facility] [place] was admitted to the Hospital on 04/07/2017 and Discharged  04/10/2017 and should be excused from work/school   for 4  days starting 04/07/2017 , may return to work/school without any restrictions.  Call Dustin Flock MD with questions.  Dustin Flock M.D on 04/10/2017,at 10:53 AM  [place] at [facility]  [phone]

## 2017-04-10 NOTE — Op Note (Signed)
[facility] Gastroenterology Patient Name: Teresa Rhodes Procedure Date: 04/10/2017 7:36 AM MRN: [id number] Account #: [id number] Date of Birth: [birth year] Admit Type: Inpatient Age: 46 Room: [facility] ENDO ROOM 1 Gender: Female Note Status: Finalized Procedure:            Colonoscopy Indications:          Rectal bleeding Providers:            Jonathon Bellows MD, MD Medicines:            Monitored Anesthesia Care Complications:        No immediate complications. Procedure:            Pre-Anesthesia Assessment:                       - Prior to the procedure, a History and Physical was                        performed, and patient medications, allergies and                        sensitivities were reviewed. The patient's tolerance of                        previous anesthesia was reviewed.                       - The risks and benefits of the procedure and the                        sedation options and risks were discussed with the                        patient. All questions were answered and informed                        consent was obtained.                       - ASA Grade Assessment: III - A patient with severe                        systemic disease.                       After obtaining informed consent, the colonoscope was                        passed under direct vision. Throughout the procedure,                        the patient's blood pressure, pulse, and oxygen                        saturations were monitored continuously. The                        Colonoscope was introduced through the anus and                        advanced to the the terminal ileum. The colonoscopy was  performed with ease. The patient tolerated the                        procedure well. The quality of the bowel preparation                        was excellent. Findings:      The perianal and digital rectal examinations were normal.      Non-bleeding  internal hemorrhoids were found during retroflexion. The       hemorrhoids were small and Grade I (internal hemorrhoids that do not       prolapse).      The exam was otherwise without abnormality on direct and retroflexion       views. Impression:           - Non-bleeding internal hemorrhoids.                       - The examination was otherwise normal on direct and                        retroflexion views.                       - No specimens collected. Recommendation:       - Discharge patient to home (with escort).                       - Advance diet as tolerated.                       - Continue present medications.                       - Await pathology results.                       - Return to my office PRN.                       - 1. No further GI recs, advance diet . Mild gastritis                        likely cause of abdominal pain and rectal bleeding from                        hemorroids. Procedure Code(s):    --- Professional ---                       [phone], Colonoscopy, flexible; diagnostic, including                        collection of specimen(s) by brushing or washing, when                        performed (separate procedure) Diagnosis Code(s):    --- Professional ---                       K64.0, First degree hemorrhoids                       K62.5, Hemorrhage of anus and rectum CPT copyright  2016 American Medical Association. All rights reserved. The codes documented in this report are preliminary and upon coder review may  be revised to meet current compliance requirements. Jonathon Bellows, MD Jonathon Bellows MD, MD 04/10/2017 8:41:32 AM This report has been signed electronically. Number of Addenda: 0 Note Initiated On: 04/10/2017 7:36 AM Scope Withdrawal Time: 0 hours 12 minutes 47 seconds  Total Procedure Duration: 0 hours 14 minutes 5 seconds       [facility]

## 2017-04-10 NOTE — Anesthesia Preprocedure Evaluation (Signed)
Anesthesia Evaluation  Patient identified by MRN, date of birth, ID band Patient awake    Reviewed: Allergy & Precautions, NPO status , Patient's Chart, lab work & pertinent test results, reviewed documented beta blocker date and time   Airway Mallampati: III  TM Distance: >3 FB     Dental  (+) Chipped   Pulmonary former smoker,           Cardiovascular      Neuro/Psych  Headaches, Seizures -,  PSYCHIATRIC DISORDERS Depression  Neuromuscular disease    GI/Hepatic   Endo/Other    Renal/GU      Musculoskeletal  (+) Arthritis ,   Abdominal   Peds  Hematology   Anesthesia Other Findings Obese.Last seizure in 2011. None since.Neck movement ok. Drug pos for benzo and tricyclics. No N/V this am.  Reproductive/Obstetrics                             Anesthesia Physical Anesthesia Plan  ASA: III  Anesthesia Plan: General   Post-op Pain Management:    Induction: Intravenous  PONV Risk Score and Plan:   Airway Management Planned:   Additional Equipment:   Intra-op Plan:   Post-operative Plan:   Informed Consent: I have reviewed the patients History and Physical, chart, labs and discussed the procedure including the risks, benefits and alternatives for the proposed anesthesia with the patient or authorized representative who has indicated his/her understanding and acceptance.     Plan Discussed with: CRNA  Anesthesia Plan Comments:         Anesthesia Quick Evaluation

## 2017-04-10 NOTE — Anesthesia Postprocedure Evaluation (Signed)
Anesthesia Post Note  Patient: Teresa Rhodes  Procedure(s) Performed: Procedure(s) (LRB): ESOPHAGOGASTRODUODENOSCOPY (EGD) WITH PROPOFOL (N/A) COLONOSCOPY WITH PROPOFOL (N/A)  Patient location during evaluation: Endoscopy Anesthesia Type: General Level of consciousness: awake and alert Pain management: pain level controlled Vital Signs Assessment: post-procedure vital signs reviewed and stable Respiratory status: spontaneous breathing, nonlabored ventilation, respiratory function stable and patient connected to nasal cannula oxygen Cardiovascular status: blood pressure returned to baseline and stable Postop Assessment: no apparent nausea or vomiting Anesthetic complications: no     Last Vitals:  Vitals:   04/10/17 0906 04/10/17 0916  BP: 137/84 (!) 152/88  Pulse: 84 75  Resp: 20 20  Temp:    SpO2: 100% 100%    Last Pain:  Vitals:   04/10/17 0846  TempSrc: Tympanic  PainSc:                  Curtina Grills S

## 2017-04-10 NOTE — Progress Notes (Signed)
Per MD, remove IV access

## 2017-04-10 NOTE — Op Note (Signed)
[facility] Patient Name: Teresa Rhodes Procedure Date: 04/10/2017 7:37 AM MRN: [id number] Account #: [id number] Date of Birth: 22-Jul-1970 Admit Type: Inpatient Age: 46 Room: [facility] ENDO ROOM 1 Gender: Female Note Status: Finalized Procedure:            Upper GI endoscopy Indications:          Dyspepsia Providers:            Jonathon Bellows MD, MD Referring MD:         Leonie Douglas. Doy Hutching, MD (Referring MD) Medicines:            Monitored Anesthesia Care Complications:        No immediate complications. Procedure:            Pre-Anesthesia Assessment:                       - Prior to the procedure, a History and Physical was                        performed, and patient medications, allergies and                        sensitivities were reviewed. The patient's tolerance of                        previous anesthesia was reviewed.                       - The risks and benefits of the procedure and the                        sedation options and risks were discussed with the                        patient. All questions were answered and informed                        consent was obtained.                       - ASA Grade Assessment: III - A patient with severe                        systemic disease.                       After obtaining informed consent, the endoscope was                        passed under direct vision. Throughout the procedure,                        the patient's blood pressure, pulse, and oxygen                        saturations were monitored continuously. The Endoscope                        was introduced through the mouth, and advanced to the  third part of duodenum. The upper GI endoscopy was                        accomplished with ease. The patient tolerated the                        procedure well. Findings:      The esophagus was normal.      The examined duodenum was normal.      Diffuse mild  inflammation characterized by congestion (edema) and       erythema was found in the gastric antrum and in the prepyloric region of       the stomach. Biopsies were taken with a cold forceps for histology.      Multiple 4 to 6 mm sessile polyps with no bleeding and no stigmata of       recent bleeding were found in the gastric fundus. Biopsies were taken       with a cold forceps for histology. Impression:           - Normal esophagus.                       - Normal examined duodenum.                       - Gastritis. Biopsied.                       - Multiple gastric polyps. Biopsied. Recommendation:       - Await pathology results.                       - Perform a colonoscopy today. Procedure Code(s):    --- Professional ---                       [phone], Esophagogastroduodenoscopy, flexible, transoral;                        with biopsy, single or multiple Diagnosis Code(s):    --- Professional ---                       K29.70, Gastritis, unspecified, without bleeding                       K31.7, Polyp of stomach and duodenum                       R10.13, Epigastric pain CPT copyright 2016 American Medical Association. All rights reserved. The codes documented in this report are preliminary and upon coder review may  be revised to meet current compliance requirements. Jonathon Bellows, MD Jonathon Bellows MD, MD 04/10/2017 8:22:12 AM This report has been signed electronically. Number of Addenda: 0 Note Initiated On: 04/10/2017 7:37 AM      [facility]

## 2017-04-10 NOTE — Discharge Summary (Signed)
[facility], [place] y.o., DOB 03-10-1971, MRN [id number]. Admission date: 04/07/2017 Discharge Date 04/10/2017 Primary MD Idelle Crouch, MD Admitting Physician Demetrios Loll, MD  Admission Diagnosis  Generalized abdominal pain [R10.84] Intractable pain [R52] Leukocytosis, unspecified type [D72.829] Intractable vomiting with nausea, unspecified vomiting type [R11.2]  Discharge Diagnosis   Active Problems: Abdominal pain due to acute gastritis   Nausea & vomiting Depression Migraines Seizures        Hospital Course Patient is a 46 year old presented with abdominal pain.She underwent a CT of the abdomen showed no acute abnormality. She was admitted for further evaluation and therapy. She was seen in consultation by GI who performed the EGD and a colonoscopy. EGD showed acute gastritis colonoscopy showed internal hemorrhoids. Patient's symptoms are improved and she is stable for discharge home.             Consults  GI  Significant Tests:  See full reports for all details    Ct Abdomen Pelvis W Contrast  Result Date: 04/07/2017 CLINICAL DATA:  Epigastric pain with nausea and vomiting EXAM: CT ABDOMEN AND PELVIS WITH CONTRAST TECHNIQUE: Multidetector CT imaging of the abdomen and pelvis was performed using the standard protocol following bolus administration of intravenous contrast. CONTRAST:  151mL ISOVUE-300 IOPAMIDOL (ISOVUE-300) INJECTION 61% COMPARISON:  11/01/2014 FINDINGS: Lower chest: No acute abnormality. Hepatobiliary: Mild steatosis. Surgical clips at the gallbladder fossa. No biliary dilatation. No focal hepatic abnormality. Pancreas: Unremarkable. No pancreatic ductal dilatation or surrounding inflammatory changes. Spleen: Normal in size without focal abnormality. Adrenals/Urinary Tract: Adrenal glands are unremarkable. Kidneys are normal, without renal calculi, focal lesion, or hydronephrosis. Bladder is  unremarkable. Stomach/Bowel: Stomach is within normal limits. Appendix appears normal. No evidence of bowel wall thickening, distention, or inflammatory changes. Vascular/Lymphatic: No significant vascular findings are present. No enlarged abdominal or pelvic lymph nodes. Reproductive: Uterus and bilateral adnexa are unremarkable. Other: Negative for free air or free fluid. Fat containing umbilical hernia. Musculoskeletal: Posterior stabilization rods and fixating screws at L4-L5 with interbody device. IMPRESSION: 1. No CT evidence for acute intra-abdominal or pelvic pathology. 2. Mild steatosis Electronically Signed   By: Donavan Foil M.D.   On: 04/07/2017 19:55       Today   Subjective:   Teresa Rhodes feels better abdominal pain improved  Objective:   Blood pressure (!) 152/88, pulse 75, temperature (!) 96.2 F (35.7 C), temperature source Tympanic, resp. rate 20, height 5\' 9"  (1.753 m), weight 238 lb (108 kg), SpO2 100 %.  .  Intake/Output Summary (Last 24 hours) at 04/10/17 1418 Last data filed at 04/10/17 0839  Gross per 24 hour  Intake              200 ml  Output              100 ml  Net              100 ml    Exam VITAL SIGNS: Blood pressure (!) 152/88, pulse 75, temperature (!) 96.2 F (35.7 C), temperature source Tympanic, resp. rate 20, height 5\' 9"  (1.753 m), weight 238 lb (108 kg), SpO2 100 %.  GENERAL:  46 y.o.-year-old patient lying in the bed with no acute distress.  EYES: Pupils equal, round, reactive to light and accommodation. No scleral icterus. Extraocular muscles intact.  HEENT: Head atraumatic, normocephalic. Oropharynx and nasopharynx clear.  NECK:  Supple, no jugular venous distention. No thyroid enlargement, no tenderness.  LUNGS: Normal breath sounds bilaterally, no wheezing, rales,rhonchi or crepitation. No use of accessory muscles of respiration.  CARDIOVASCULAR: S1, S2 normal. No murmurs, rubs, or gallops.  ABDOMEN: Soft, nontender, nondistended.  Bowel sounds present. No organomegaly or mass.  EXTREMITIES: No pedal edema, cyanosis, or clubbing.  NEUROLOGIC: Cranial nerves II through XII are intact. Muscle strength 5/5 in all extremities. Sensation intact. Gait not checked.  PSYCHIATRIC: The patient is alert and oriented x 3.  SKIN: No obvious rash, lesion, or ulcer.   Data Review     CBC w Diff: Lab Results  Component Value Date   WBC 10.8 04/08/2017   HGB 13.1 04/08/2017   HGB 15.1 11/17/2013   HCT 37.5 04/08/2017   HCT 44.0 11/17/2013   PLT 198 04/08/2017   PLT 189 11/17/2013   LYMPHOPCT 7 08/11/2015   MONOPCT 1 08/11/2015   EOSPCT 0 08/11/2015   BASOPCT 0 08/11/2015   CMP: Lab Results  Component Value Date   NA 143 04/08/2017   NA 141 06/12/2014   NA 141 11/17/2013   K 3.9 04/08/2017   K 3.6 11/17/2013   CL 110 04/08/2017   CL 107 11/17/2013   CO2 27 04/08/2017   CO2 23 11/17/2013   BUN <5 (L) 04/08/2017   BUN 5 (L) 06/12/2014   BUN 10 11/17/2013   CREATININE 0.55 04/08/2017   CREATININE 0.54 (L) 11/17/2013   PROT 7.7 04/07/2017   PROT 7.3 06/12/2014   PROT 7.9 11/17/2013   ALBUMIN 4.1 04/07/2017   ALBUMIN 4.2 11/11/2016   ALBUMIN 4.0 11/17/2013   BILITOT 0.6 04/07/2017   BILITOT 0.3 11/17/2013   ALKPHOS 90 04/07/2017   ALKPHOS 99 11/17/2013   AST 27 04/07/2017   AST 25 11/17/2013   ALT 30 04/07/2017   ALT 24 11/17/2013  .  Micro Results No results found for this or any previous visit (from the past 240 hour(s)).      Code Status Orders        Start     Ordered   04/07/17 2139  Full code  Continuous     04/07/17 2138    Code Status History    Date Active Date Inactive Code Status Order ID Comments User Context   This patient has a current code status but no historical code status.          Follow-up Information    Idelle Crouch, MD. Schedule an appointment as soon as possible for a visit in 1 week.   Specialty:  Internal Medicine Why:  Office closed on weekend  Contact  information: [place] [address] [phone]        Jonathon Bellows, MD. Schedule an appointment as soon as possible for a visit in 2 weeks.   Specialty:  Surgery Why:  hospital f/u -- Office closed on weekend  Contact information: [place] [address] [phone]           Discharge Medications   Allergies as of 04/10/2017      Reactions   Robaxin [methocarbamol] Other (See Comments)   Made skin red and hot and itchy      Medication List    TAKE these medications   azelastine 0.1 % nasal spray Commonly known as:  ASTELIN Place 2 sprays into both nostrils every 12 (twelve) hours as needed for rhinitis or allergies.   diazepam 5 MG tablet Commonly known as:  VALIUM Take  5 mg by mouth every 12 (twelve) hours as needed for anxiety.   DULoxetine 30 MG capsule Commonly known as:  CYMBALTA Take 30 mg by mouth at bedtime.   HYDROcodone-acetaminophen 5-325 MG tablet Commonly known as:  NORCO/VICODIN Take 1-2 tablets by mouth every 4 (four) hours as needed for moderate pain.   nortriptyline 50 MG capsule Commonly known as:  PAMELOR Take 50 mg by mouth at bedtime.   omeprazole 40 MG capsule Commonly known as:  PRILOSEC Take 1 capsule (40 mg total) by mouth 2 (two) times daily. Then take once a day as before What changed:  when to take this  additional instructions   ondansetron 4 MG tablet Commonly known as:  ZOFRAN Take 1 tablet (4 mg total) by mouth every 6 (six) hours as needed for nausea.   topiramate 50 MG tablet Commonly known as:  TOPAMAX Take 100 mg by mouth at bedtime.            Discharge Care Instructions        Start     Ordered   04/10/17 0000  HYDROcodone-acetaminophen (NORCO/VICODIN) 5-325 MG tablet  Every 4 hours PRN     04/10/17 1056   04/10/17 0000  omeprazole (PRILOSEC) 40 MG capsule  2 times daily     04/10/17 1056   04/10/17 0000  ondansetron (ZOFRAN) 4  MG tablet  Every 6 hours PRN     04/10/17 1242         Total Time in preparing paper work, data evaluation and todays exam - 35 minutes  Dustin Flock M.D on 04/10/2017 at 2:18 [facility]  [place]  [phone]

## 2017-04-11 ENCOUNTER — Encounter: Payer: Self-pay | Admitting: Gastroenterology

## 2017-04-12 LAB — H. PYLORI ANTIGEN, STOOL: H. Pylori Stool Ag, Eia: NEGATIVE

## 2017-04-12 LAB — SURGICAL PATHOLOGY

## 2017-04-22 ENCOUNTER — Encounter (HOSPITAL_COMMUNITY): Payer: Self-pay | Admitting: Orthopedic Surgery

## 2017-04-25 DIAGNOSIS — I10 Essential (primary) hypertension: Secondary | ICD-10-CM | POA: Diagnosis not present

## 2017-05-09 ENCOUNTER — Ambulatory Visit (INDEPENDENT_AMBULATORY_CARE_PROVIDER_SITE_OTHER): Payer: 59 | Admitting: Gastroenterology

## 2017-05-09 ENCOUNTER — Encounter: Payer: Self-pay | Admitting: Gastroenterology

## 2017-05-09 VITALS — BP 125/84 | HR 76 | Temp 98.0°F | Ht 69.0 in | Wt 244.8 lb

## 2017-05-09 DIAGNOSIS — R112 Nausea with vomiting, unspecified: Secondary | ICD-10-CM | POA: Diagnosis not present

## 2017-05-09 DIAGNOSIS — K5900 Constipation, unspecified: Secondary | ICD-10-CM

## 2017-05-09 MED ORDER — OMEPRAZOLE 40 MG PO CPDR: 40.0000 mg | DELAYED_RELEASE_CAPSULE | Freq: Every day | ORAL | 0 refills | Status: DC

## 2017-05-09 MED ORDER — MAGNESIUM CITRATE PO SOLN: 1.0000 | Freq: Once | ORAL | 0 refills | Status: AC

## 2017-05-09 NOTE — Progress Notes (Signed)
Jonathon Bellows MD, MRCP(U.K) [address]  [place]  [place], [place] [address]  Main: [phone]  Fax: [phone]   Primary Care Physician: Idelle Crouch, MD  Primary Gastroenterologist:  Dr. Jonathon Bellows   No chief complaint on file.   HPI: Teresa Rhodes is a 46 y.o. female  She is here today for a hospital follow up . I was consulted to see her when she was admitted on 04/07/17 for abdominal pain and rectal bleeding . She has a history  of chronic constipation known to Dr Gustavo Lah at [facility], seen at their office, has been on [place] . Last colonoscopy back in 2012. She was admitted via the ER for abdominal pain and rectal bleeding. Hb 14.4 on admission . CT abdomen showed no acute abnormality. I performed an EGD on 04/10/17 , biopsies of the stoimach showed mild chronic gastritis , negative for H pylori, few gastric fundic polyps were confirmed on biopsy. I also performed a colonoscopy and was normal except for small internal hemorrhoids. Hb on admission was 13.1 grams   Interval history   9//2018-  05/09/2017   Says doing not as bad as she was at the hospital . Says her stomach is sore on and off, sometimes daily . Ran out of her omeprazole. Was taking it once a day . Was taking it before food. No NSAID's. She has a bowel movement once a week . On days she has a good BM has less abdominal pain. For her constipation she was taking "some laxatives", presently she says that she drinks Gatorade to help her go .   Not on any opiods.     Current Outpatient Prescriptions  Medication Sig Dispense Refill  . hyoscyamine (LEVSIN, ANASPAZ) 0.125 MG tablet Take by mouth.    . ranitidine (ZANTAC) 300 MG tablet Take by mouth.    . SUMAtriptan (IMITREX) 100 MG tablet Take 1/2-1 tab at headache onset, may repeat once if needed in 2 hours.  No more than 2 in 24 hours.    Marland Kitchen amoxicillin-clavulanate (AUGMENTIN) 500-125 MG tablet TAKE 1 TABLET BY MOUTH THREE TIMES A DAY FOR 10 DAYS  0  .  azelastine (ASTELIN) 0.1 % nasal spray Place 2 sprays into both nostrils every 12 (twelve) hours as needed for rhinitis or allergies.     . bisoprolol-hydrochlorothiazide (ZIAC) 5-6.25 MG tablet Take 1 tablet by mouth daily.  11  . diazepam (VALIUM) 5 MG tablet Take 5 mg by mouth every 12 (twelve) hours as needed for anxiety.     . DULoxetine (CYMBALTA) 30 MG capsule Take 30 mg by mouth at bedtime.    . hydrochlorothiazide (HYDRODIURIL) 25 MG tablet     . HYDROcodone-acetaminophen (NORCO/VICODIN) 5-325 MG tablet Take 1-2 tablets by mouth every 4 (four) hours as needed for moderate pain. 15 tablet 0  . nortriptyline (PAMELOR) 25 MG capsule TAKE 2 CAPSULES (50MG ) BY MOUTH AT NIGHT FOR 1 WEEK, THEN INCREASE TO 3 CAPSULES (75MG )  1  . omeprazole (PRILOSEC) 40 MG capsule Take 1 capsule (40 mg total) by mouth 2 (two) times daily. Then take once a day as before 30 capsule 0  . ondansetron (ZOFRAN) 4 MG tablet Take 1 tablet (4 mg total) by mouth every 6 (six) hours as needed for nausea. 20 tablet 0  . polyethylene glycol (MIRALAX / GLYCOLAX) packet Take by mouth.    . topiramate (TOPAMAX) 50 MG tablet Take 100 mg by mouth at bedtime.  No current facility-administered medications for this visit.     Allergies as of 05/09/2017 - Review Complete 04/10/2017  Allergen Reaction Noted  . Robaxin [methocarbamol] Other (See Comments) 12/12/2014    ROS:  General: Negative for anorexia, weight loss, fever, chills, fatigue, weakness. ENT: Negative for hoarseness, difficulty swallowing , nasal congestion. CV: Negative for chest pain, angina, palpitations, dyspnea on exertion, peripheral edema.  Respiratory: Negative for dyspnea at rest, dyspnea on exertion, cough, sputum, wheezing.  GI: See history of present illness. GU:  Negative for dysuria, hematuria, urinary incontinence, urinary frequency, nocturnal urination.  Endo: Negative for unusual weight change.    Physical Examination:   There were no  vitals taken for this visit.  General: Well-nourished, well-developed in no acute distress.  Eyes: No icterus. Conjunctivae pink. Mouth: Oropharyngeal mucosa moist and pink , no lesions erythema or exudate. Lungs: Clear to auscultation bilaterally. Non-labored. Heart: Regular rate and rhythm, no murmurs rubs or gallops.  Abdomen: Bowel sounds are normal, nontender, nondistended, no hepatosplenomegaly or masses, no abdominal bruits or hernia , no rebound or guarding.   Extremities: No lower extremity edema. No clubbing or deformities. Neuro: Alert and oriented x 3.  Grossly intact. Skin: Warm and dry, no jaundice.   Psych: Alert and cooperative, normal mood and affect.  BP 125/84   Pulse 76   Temp 98 F (36.7 C) (Oral)   Ht 5\' 9"  (1.753 m)   Wt 244 lb 12.8 oz (111 kg)   BMI 36.15 kg/m     Imaging Studies: No results found.  Assessment and Plan:   Teresa Rhodes is a 46 y.o. y/o female here to follow up for hospital discharge. At her discharge I felt her abdominal pain was secondary to gastritis +/- constipation   Plan   1. No nsaids 2. Trial of Trulance for 2 weeks , samples provided. Suggested to start after a dose of magensium citrate to clean out her colon  3. High fiber diet information provided 4. Refill of PPI provided 5. If no better at next time will consider GES and a pain modulator such as amitryptaline    Dr Jonathon Bellows  MD,MRCP [facility]) Follow up in 4 weeks

## 2017-06-07 DIAGNOSIS — K5904 Chronic idiopathic constipation: Secondary | ICD-10-CM | POA: Diagnosis not present

## 2017-06-07 DIAGNOSIS — I10 Essential (primary) hypertension: Secondary | ICD-10-CM | POA: Diagnosis not present

## 2017-06-20 ENCOUNTER — Encounter: Payer: Self-pay | Admitting: Gastroenterology

## 2017-06-20 ENCOUNTER — Ambulatory Visit: Payer: 59 | Admitting: Gastroenterology

## 2017-06-20 VITALS — BP 100/60 | HR 76 | Temp 97.5°F | Ht 69.0 in | Wt 247.2 lb

## 2017-06-20 DIAGNOSIS — R635 Abnormal weight gain: Secondary | ICD-10-CM

## 2017-06-20 DIAGNOSIS — R1013 Epigastric pain: Secondary | ICD-10-CM | POA: Diagnosis not present

## 2017-06-20 NOTE — Progress Notes (Signed)
Teresa Bellows MD, MRCP(U.K) [address]  [place]  [place], [place] [address]  Main: [phone]  Fax: [phone]   Primary Care Physician: Idelle Crouch, MD  Primary Gastroenterologist:  Dr. Jonathon Rhodes   No chief complaint on file.   HPI: Teresa Rhodes is a 46 y.o. female  Summary of history :  She is here today to follow-up to her last visit on 05/09/2017 for abdominal pain very likely secondary to gastritis and possibly constipation. .I was consulted to see her when she was admitted on 04/07/17 for abdominal pain and rectal bleeding . She has a history  of chronic constipation known to Dr Gustavo Lah at [facility], seen at their office, has been on [place] . Last colonoscopy back in 2012. She was admitted via the ER for abdominal pain and rectal bleeding. Hb 14.4 on admission . CT abdomen showed no acute abnormality. I performed an EGD on 04/10/17 , biopsies of the stoimach showed mild chronic gastritis , negative for H pylori, few gastric fundic polyps were confirmed on biopsy. I also performed a colonoscopy and was normal except for small internal hemorrhoids. Hb on admission was 13.1 grams  Interval history 05/09/2017 to 06/20/2017   Did not continue Trulance, Having regular bowel movements. Gained 10 lbs last few months .   Had one attack of abdominal pain last week . Sharp in nature- "slicing in nature", prior to that had regular bowel movements. Not sure what the cause was. Took a hydrocodone and got better.    Current Outpatient Medications  Medication Sig Dispense Refill  . amoxicillin-clavulanate (AUGMENTIN) 500-125 MG tablet TAKE 1 TABLET BY MOUTH THREE TIMES A DAY FOR 10 DAYS  0  . azelastine (ASTELIN) 0.1 % nasal spray Place 2 sprays into both nostrils every 12 (twelve) hours as needed for rhinitis or allergies.     . bisoprolol-hydrochlorothiazide (ZIAC) 5-6.25 MG tablet Take 1 tablet by mouth daily.  11  . cefUROXime (CEFTIN) 500 MG tablet Take 500 mg by mouth  every 12 (twelve) hours.  0  . diazepam (VALIUM) 5 MG tablet Take 5 mg by mouth every 12 (twelve) hours as needed for anxiety.     . DULoxetine (CYMBALTA) 30 MG capsule Take 30 mg by mouth at bedtime.    . hydrochlorothiazide (HYDRODIURIL) 25 MG tablet     . HYDROcodone-acetaminophen (NORCO/VICODIN) 5-325 MG tablet Take 1-2 tablets by mouth every 4 (four) hours as needed for moderate pain. (Patient not taking: Reported on 05/09/2017) 15 tablet 0  . hyoscyamine (LEVSIN, ANASPAZ) 0.125 MG tablet Take by mouth.    . nortriptyline (PAMELOR) 25 MG capsule TAKE 2 CAPSULES (50MG ) BY MOUTH AT NIGHT FOR 1 WEEK, THEN INCREASE TO 3 CAPSULES (75MG )  1  . omeprazole (PRILOSEC) 40 MG capsule Take 1 capsule (40 mg total) by mouth daily. Then take once a day as before 30 capsule 0  . ondansetron (ZOFRAN) 4 MG tablet Take 1 tablet (4 mg total) by mouth every 6 (six) hours as needed for nausea. 20 tablet 0  . polyethylene glycol (MIRALAX / GLYCOLAX) packet Take by mouth.    . ranitidine (ZANTAC) 300 MG tablet Take by mouth.    . SUMAtriptan (IMITREX) 100 MG tablet Take 1/2-1 tab at headache onset, may repeat once if needed in 2 hours.  No more than 2 in 24 hours.    . topiramate (TOPAMAX) 50 MG tablet Take 100 mg by mouth at bedtime.     No current  facility-administered medications for this visit.     Allergies as of 06/20/2017 - Review Complete 05/09/2017  Allergen Reaction Noted  . Robaxin [methocarbamol] Other (See Comments) 12/12/2014    ROS:  General: Negative for anorexia, weight loss, fever, chills, fatigue, weakness. ENT: Negative for hoarseness, difficulty swallowing , nasal congestion. CV: Negative for chest pain, angina, palpitations, dyspnea on exertion, peripheral edema.  Respiratory: Negative for dyspnea at rest, dyspnea on exertion, cough, sputum, wheezing.  GI: See history of present illness. GU:  Negative for dysuria, hematuria, urinary incontinence, urinary frequency, nocturnal  urination.  Endo: Negative for unusual weight change.    Physical Examination:   There were no vitals taken for this visit.  General: Well-nourished, well-developed in no acute distress.  Eyes: No icterus. Conjunctivae pink. Mouth: Oropharyngeal mucosa moist and pink , no lesions erythema or exudate. Lungs: Clear to auscultation bilaterally. Non-labored. Heart: Regular rate and rhythm, no murmurs rubs or gallops.  Abdomen: Bowel sounds are normal, nontender, nondistended, no hepatosplenomegaly or masses, no abdominal bruits or hernia , no rebound or guarding.   Extremities: No lower extremity edema. No clubbing or deformities. Neuro: Alert and oriented x 3.  Grossly intact. Skin: Warm and dry, no jaundice.   Psych: Alert and cooperative, normal mood and affect.   Imaging Studies: No results found.  Assessment and Plan:   Teresa Rhodes is a 46 y.o. y/o female here to follow up for hospital discharge. At her discharge I felt her abdominal pain was secondary to gastritis +/- constipation . Doing resonably well since last visit., Only 1 epsiode of pain and no constipation. Gained wieght   Plan   1. No nsaids 2. Continue Prilosec  3. Trial of IB guard prn- samples will be provided  4. She would like to be seen by a dietician to help lose weight 5. If pain recurs will consider GES and a pain modulator such as amitryptaline   Dr Teresa Bellows  MD,MRCP [facility]) Follow up PRN

## 2017-06-20 NOTE — Addendum Note (Signed)
Addended by: Peggye Ley on: 06/20/2017 01:50 PM   Modules accepted: Orders

## 2017-06-21 DIAGNOSIS — I10 Essential (primary) hypertension: Secondary | ICD-10-CM | POA: Diagnosis not present

## 2017-06-21 DIAGNOSIS — G43809 Other migraine, not intractable, without status migrainosus: Secondary | ICD-10-CM | POA: Diagnosis not present

## 2017-07-27 DIAGNOSIS — I10 Essential (primary) hypertension: Secondary | ICD-10-CM | POA: Diagnosis not present

## 2017-07-27 DIAGNOSIS — M5137 Other intervertebral disc degeneration, lumbosacral region: Secondary | ICD-10-CM | POA: Diagnosis not present

## 2017-07-29 DIAGNOSIS — B349 Viral infection, unspecified: Secondary | ICD-10-CM | POA: Diagnosis not present

## 2017-08-03 DIAGNOSIS — R05 Cough: Secondary | ICD-10-CM | POA: Diagnosis not present

## 2017-08-03 DIAGNOSIS — R0602 Shortness of breath: Secondary | ICD-10-CM | POA: Diagnosis not present

## 2017-08-03 DIAGNOSIS — I10 Essential (primary) hypertension: Secondary | ICD-10-CM | POA: Diagnosis not present

## 2017-08-03 DIAGNOSIS — M5137 Other intervertebral disc degeneration, lumbosacral region: Secondary | ICD-10-CM | POA: Diagnosis not present

## 2017-08-04 DIAGNOSIS — M542 Cervicalgia: Secondary | ICD-10-CM | POA: Diagnosis not present

## 2017-08-04 DIAGNOSIS — M5136 Other intervertebral disc degeneration, lumbar region: Secondary | ICD-10-CM | POA: Diagnosis not present

## 2017-08-04 DIAGNOSIS — M79604 Pain in right leg: Secondary | ICD-10-CM | POA: Diagnosis not present

## 2017-08-17 DIAGNOSIS — M5412 Radiculopathy, cervical region: Secondary | ICD-10-CM | POA: Diagnosis not present

## 2017-08-18 ENCOUNTER — Encounter: Payer: Self-pay | Admitting: Student in an Organized Health Care Education/Training Program

## 2017-08-18 ENCOUNTER — Ambulatory Visit
Payer: 59 | Attending: Student in an Organized Health Care Education/Training Program | Admitting: Student in an Organized Health Care Education/Training Program

## 2017-08-18 ENCOUNTER — Other Ambulatory Visit: Payer: Self-pay

## 2017-08-18 VITALS — BP 138/84 | HR 98 | Resp 18 | Ht 69.0 in | Wt 250.0 lb

## 2017-08-18 DIAGNOSIS — Z86718 Personal history of other venous thrombosis and embolism: Secondary | ICD-10-CM | POA: Diagnosis not present

## 2017-08-18 DIAGNOSIS — K5909 Other constipation: Secondary | ICD-10-CM | POA: Insufficient documentation

## 2017-08-18 DIAGNOSIS — Z981 Arthrodesis status: Secondary | ICD-10-CM

## 2017-08-18 DIAGNOSIS — F329 Major depressive disorder, single episode, unspecified: Secondary | ICD-10-CM | POA: Diagnosis not present

## 2017-08-18 DIAGNOSIS — Z79899 Other long term (current) drug therapy: Secondary | ICD-10-CM | POA: Insufficient documentation

## 2017-08-18 DIAGNOSIS — G40909 Epilepsy, unspecified, not intractable, without status epilepticus: Secondary | ICD-10-CM | POA: Diagnosis not present

## 2017-08-18 DIAGNOSIS — G894 Chronic pain syndrome: Secondary | ICD-10-CM | POA: Diagnosis present

## 2017-08-18 DIAGNOSIS — M961 Postlaminectomy syndrome, not elsewhere classified: Secondary | ICD-10-CM | POA: Diagnosis not present

## 2017-08-18 DIAGNOSIS — Z87891 Personal history of nicotine dependence: Secondary | ICD-10-CM | POA: Insufficient documentation

## 2017-08-18 DIAGNOSIS — M5412 Radiculopathy, cervical region: Secondary | ICD-10-CM

## 2017-08-18 DIAGNOSIS — I10 Essential (primary) hypertension: Secondary | ICD-10-CM | POA: Insufficient documentation

## 2017-08-18 DIAGNOSIS — M25512 Pain in left shoulder: Secondary | ICD-10-CM | POA: Diagnosis not present

## 2017-08-18 DIAGNOSIS — M4722 Other spondylosis with radiculopathy, cervical region: Secondary | ICD-10-CM | POA: Diagnosis not present

## 2017-08-18 DIAGNOSIS — Q761 Klippel-Feil syndrome: Secondary | ICD-10-CM | POA: Diagnosis not present

## 2017-08-18 DIAGNOSIS — M5416 Radiculopathy, lumbar region: Secondary | ICD-10-CM

## 2017-08-18 DIAGNOSIS — M4802 Spinal stenosis, cervical region: Secondary | ICD-10-CM | POA: Insufficient documentation

## 2017-08-18 DIAGNOSIS — M21371 Foot drop, right foot: Secondary | ICD-10-CM | POA: Insufficient documentation

## 2017-08-18 MED ORDER — TIZANIDINE HCL 4 MG PO TABS: 4.0000 mg | ORAL_TABLET | Freq: Three times a day (TID) | ORAL | 1 refills | Status: DC | PRN

## 2017-08-18 NOTE — Progress Notes (Signed)
Safety precautions to be maintained throughout the outpatient stay will include: orient to surroundings, keep bed in low position, maintain call bell within reach at all times, provide assistance with transfer out of bed and ambulation.  

## 2017-08-18 NOTE — Progress Notes (Signed)
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 smokeless tobacco. Medical:  has a past medical history of Complication of anesthesia, Depression, Gallstones, Hypertension, Migraine, and Seizures ([place]) (2010-2013). Family: family history includes Kidney disease (age of onset: 54) in her brother; Kidney disease (age of onset: 74) in her father; Liver disease in her maternal grandfather; Ovarian cancer in her mother; Ulcerative colitis in her mother.  Past Surgical History:  Procedure Laterality Date  . BACK SURGERY    . CHOLECYSTECTOMY  2002  .  COLONOSCOPY WITH PROPOFOL N/A 04/10/2017   Procedure: COLONOSCOPY WITH PROPOFOL;  Surgeon: Jonathon Bellows, MD;  Location: [facility] ENDOSCOPY;  Service: Gastroenterology;  Laterality: N/A;  . ESOPHAGOGASTRODUODENOSCOPY (EGD)  WITH PROPOFOL N/A 04/10/2017   Procedure: ESOPHAGOGASTRODUODENOSCOPY (EGD) WITH PROPOFOL;  Surgeon: Jonathon Bellows, MD;  Location: [facility] ENDOSCOPY;  Service: Gastroenterology;  Laterality: N/A;  . NECK SURGERY  2005 & 2009   x2   Active Ambulatory Problems    Diagnosis Date Noted  . White matter abnormality on MRI of brain 06/12/2014  . Foot drop 06/12/2014  . Right arm weakness 06/12/2014  . Lumbosacral radiculopathy 07/22/2014  . Back pain 09/11/2015  . Degeneration of intervertebral disc of lumbosacral region 09/12/2015  . Headache, migraine 09/12/2015  . Current tobacco use 09/12/2015  . Nausea & vomiting 04/07/2017  . Chronic idiopathic constipation 12/04/2015  . Deep vein thrombosis (DVT) of left lower extremity ([place]) 11/23/2016  . Difficulty sleeping 10/20/2016  . Headache disorder 10/20/2016  . Leg swelling 11/23/2016  . Pinched nerve 11/23/2016  . Right leg pain 11/23/2016  . Seizure ([place]) 12/01/2016   Resolved Ambulatory Problems    Diagnosis Date Noted  . No Resolved Ambulatory Problems   Past Medical History:  Diagnosis Date  . Complication of anesthesia   . Depression   . Gallstones   . Hypertension   . Migraine   . Seizures ([place]) 2010-2013   Constitutional Exam  General appearance: Well nourished, well developed, and well hydrated. In no apparent acute distress Vitals:   08/18/17 0908  BP: 138/84  Pulse: 98  Resp: 18  SpO2: 100%  Weight: 250 lb (113.4 kg)  Height: _0  (1.753 m)   BMI Assessment: Estimated body mass index is 36.92 kg/m as calculated from the following:   Height as of this encounter: _1  (1.753 m).   Weight as of this encounter: 250 lb (113.4 kg).  BMI interpretation table: BMI level Category Range association with higher incidence of chronic pain  <18 kg/m2 Underweight   18.5-24.9 kg/m2 Ideal body weight   25-29.9 kg/m2 Overweight Increased incidence by 20%  30-34.9 kg/m2 Obese (Class I) Increased incidence by 68%  35-39.9 kg/m2  Severe obesity (Class II) Increased incidence by 136%  >40 kg/m2 Extreme obesity (Class III) Increased incidence by 254%   BMI Readings from Last 4 Encounters:  08/18/17 36.92 kg/m  06/20/17 36.51 kg/m  05/09/17 36.15 kg/m  04/07/17 35.15 kg/m   Wt Readings from Last 4 Encounters:  08/18/17 250 lb (113.4 kg)  06/20/17 247 lb 3.2 oz (112.1 kg)  05/09/17 244 lb 12.8 oz (111 kg)  04/07/17 238 lb (108 kg)  Psych/Mental status: Alert, oriented x 3 (person, place, & time)       Eyes: PERLA Respiratory: No evidence of acute respiratory distress  Cervical Spine Area Exam  Skin & Axial Inspection: Well healed scar from previous spine surgery detected Alignment: Symmetrical Functional ROM: Decreased ROM, bilaterally Stability: No instability detected Muscle Tone/Strength: Functionally intact. No obvious neuro-muscular anomalies detected. Sensory (Neurological): Dermatomal pain pattern Palpation: Complains of area being tender to palpation Positive provocative maneuver for for cervical facet disease  Upper Extremity (UE) Exam    Side: Right upper extremity  Side: Left upper extremity  Skin & Extremity Inspection: Skin color, temperature, and hair growth are WNL. No peripheral edema or cyanosis. No masses, redness, swelling, asymmetry, or associated skin lesions. No contractures.  Skin & Extremity Inspection: Skin color, temperature, and hair growth are WNL. No peripheral edema or  cyanosis. No masses, redness, swelling, asymmetry, or associated skin lesions. No contractures.  Functional ROM: Unrestricted ROM          Functional ROM: Unrestricted ROM          Muscle Tone/Strength: Functionally intact. No obvious neuro-muscular anomalies detected.  Muscle Tone/Strength: Functionally intact. No obvious neuro-muscular anomalies detected.  Sensory (Neurological): Unimpaired          Sensory (Neurological): Unimpaired          Palpation: No palpable anomalies              Palpation: No palpable  anomalies              Specialized Test(s): Deferred         Specialized Test(s): Deferred         4 out of 5 strength RIGHT upper extremity: Shoulder abduction, elbow flexion, elbow extension, thumb extension.  Thoracic Spine Area Exam  Skin & Axial Inspection: No masses, redness, or swelling Alignment: Symmetrical Functional ROM: Unrestricted ROM Stability: No instability detected Muscle Tone/Strength: Functionally intact. No obvious neuro-muscular anomalies detected. Sensory (Neurological): Unimpaired Muscle strength & Tone: No palpable anomalies  Lumbar Spine Area Exam  Skin & Axial Inspection: Well healed scar from previous spine surgery detected Alignment: Symmetrical Functional ROM: Decreased ROM, bilaterally Stability: No instability detected Muscle Tone/Strength: Functionally intact. No obvious neuro-muscular anomalies detected. Sensory (Neurological): Unimpaired Palpation: No palpable anomalies       Provocative Tests: Lumbar Hyperextension and rotation test: Positive bilaterally for facet joint pain. Lumbar Lateral bending test: Positive due to fusion restriction. Patrick's Maneuver: Positive for right-sided S-I arthralgia             Right lower extremity foot drop, L4, L5 nerve root weakness.  Patient unable to dorsiflex her ankle or her great toe suggesting right L4 and right L5 dysfunction. Gait & Posture Assessment  Ambulation: Limited Gait: Antalgic Posture: WNL   Lower Extremity Exam    Side: Right lower extremity  Side: Left lower extremity  Skin & Extremity Inspection: Skin color, temperature, and hair growth are WNL. No peripheral edema or cyanosis. No masses, redness, swelling, asymmetry, or associated skin lesions. No contractures.  Skin & Extremity Inspection: Skin color, temperature, and hair growth are WNL. No peripheral edema or cyanosis. No masses, redness, swelling, asymmetry, or associated skin lesions. No contractures.  Functional ROM: Unrestricted ROM           Functional ROM: Unrestricted ROM          Muscle Tone/Strength: Functionally intact. No obvious neuro-muscular anomalies detected.  Muscle Tone/Strength: Functionally intact. No obvious neuro-muscular anomalies detected.  Sensory (Neurological): Unimpaired  Sensory (Neurological): Unimpaired  Palpation: No palpable anomalies  Palpation: No palpable anomalies   Assessment  Primary Diagnosis & Pertinent Problem List: The primary encounter diagnosis was History of fusion of lumbar spine. Diagnoses of Failed back surgical syndrome, Lumbar radiculopathy, Cervical fusion syndrome, Cervical radiculopathy, Cervical radiculopathy due to degenerative joint disease of spine, and Chronic pain syndrome were also pertinent to this visit.  Visit Diagnosis (New problems to examiner): 1. History of fusion of lumbar spine   2. Failed back surgical syndrome   3. Lumbar radiculopathy   4. Cervical fusion syndrome   5. Cervical radiculopathy   6. Cervical radiculopathy due to degenerative joint disease of spine   7. Chronic pain syndrome   General Recommendations: The pain condition that the patient suffers from is best treated with a multidisciplinary approach that involves  an increase in physical activity to prevent de-conditioning and worsening of the pain cycle, as well as psychological counseling (formal and/or informal) to address the co-morbid psychological affects of pain. Treatment will often involve judicious use of pain medications and interventional procedures to decrease the pain, allowing the patient to participate in the physical activity that will ultimately produce long-lasting pain reductions. The goal of the multidisciplinary approach is to return the patient to a higher level of overall function and to restore their ability to perform activities of daily living.   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  Patient symptoms are secondary to cervical radiculopathy, cervical fusion syndrome, failed back surgical syndrome, chronic lumbar radiculopathy.  Patient has tried various medication  including Lyrica, Effexor, Cymbalta, gabapentin, nortriptyline, Flexeril, Robaxin.  These medications had limited benefit or not effective in achieving effective pain control for the patient.  She has had a series of 9 epidural steroid injections.  She states that these were only helpful for approximately 3-4 weeks.  She has been talked to about spinal cord stimulation but she states that we will not make her right foot drop any better.  We did have a discussion about spinal cord stimulation and I believe that this therapy could be potentially beneficial and helping to reduce the patient's right lower extremity pain but not necessarily her foot drop symptoms.  Patient takes Valium rarely.  Last intake was over 3 weeks ago.  She was recently started on Adderall and finds this very helpful.  She also takes Imitrex as needed for migraine abortion.  I will complete a UDS.  Patient will be sent to pain psychology to assess safety of chronic long-term opioid use.  Patient denies any substance or alcohol abuse.  She has been compliant with her previous physicians as well as medical advice.  She takes Valium very rarely.  Plan: -Tizanidine 4 mill grams 3 times daily as needed muscle spasms -UDS today.  Expect this to be positive only for Adderall. -Referral to pain psychology which is standard for new patients to assess chronic opioid therapy safety -Future opioid considerations could include buprenorphine, hydrocodone, oxycodone -Future interventional considerations could include spinal cord stimulation, caudal ESI, erector spinae blocks, cervical ESI,  cervical facets  Ordered Lab-work, Procedure(s), Referral(s), & Consult(s): Orders Placed This Encounter  Procedures  . Compliance Drug Analysis, Ur  . Ambulatory referral to Psychology   Pharmacotherapy (current): Medications ordered:  Meds ordered this encounter  Medications  . tiZANidine (ZANAFLEX) 4 MG tablet    Sig: Take 1 tablet (4 mg total) by mouth every 8 (eight) hours as needed for muscle spasms.    Dispense:  90 tablet    Refill:  1   Medications administered during this visit: [name] had no medications administered during this visit.    Interventional management options: Ms. Terriquez was informed that there is no guarantee that she would be a candidate for interventional therapies. The decision will be based on the results of diagnostic studies, as well as Ms. Bonello's risk profile.  Procedure(s) under consideration:  -spinal cord stimulation, caudal ESI, erector spinae blocks, cervical ESI, cervical facets    Provider-requested follow-up: Return in about 4 weeks (around 09/15/2017) for After Psychological evaluation.  No future appointments.  Primary Care Physician: Idelle Crouch, MD Location: [facility] Note by: Gillis Santa, M.D, Date: 08/18/2017; Time: 10:09 AM

## 2017-08-18 NOTE — Patient Instructions (Signed)
____________________________________________________________________________________________ ° °Genicular Nerve Block ° °What is a genicular nerve block? °A genicular nerve block is the injection of a local anesthetic to block the nerves that transmits pain from the knee. ° °What is the purpose of a facet nerve block? °A genicular nerve block is a diagnostic procedure to determine if the pathologic changes (i.e. arthritis, meniscal tears, etc) and inflammation within the knee joint is the source of your knee pain. It also confirms that the knee pain will respond well to the actual treatment procedure. If a genicular nerve block works, it will give you relief for several hours. After that, the pain is expected to return to normal. This test is always performed twice (usually a week or two apart) because two successful tests are required to move onto treatment. If both diagnostic tests are positive, then we schedule a treatment called radiofrequency (RF) ablation. In this procedure, the same nerves are cauterized, which typically leads to pain relief for 4 -18 months. If this process works well for one knee, it can be performed on the other knee if needed. ° °How is the procedure performed? °You will be placed on the procedure table. The injection site is sterilized with either iodine or chlorhexadine. The site to be injected is numbed with a local anesthetic, and a needle is directed to the target area. X-ray guidance is used to ensure proper placement and positioning of the needle. When the needle is properly positioned near the genicular nerve, local anesthetic is injected to numb that nerve. This will be repeated at multiple sites around the knee to block all genicular nerves. ° °Will the procedure be painful? °The injection can be painful and we therefore provide the option of receiving IV sedation. IV sedation, combined with local anesthetic, can make the injection nearly pain free. It allows you to remain very  still during the procedure, which can also make the injection easier, faster, and more successful. If you decide to have IV sedation, you must have a driver to get you home safely afterwards. In addition, you cannot have anything to eat or drink within 8 hours of your appointment (clear liquids are allowed until 3 hours before the procedure). If you take medications for diabetes, these medications may need to be adjusted the morning of the procedure. Your primary care physician can help you with this adjustment. ° °What are the discharge instructions? °If you received IV sedation do not drive or operate machinery for at least 24 hours after the procedure. You may return to work the next day following your procedure. You may resume your normal diet immediately. Do not engage in any strenuous activity for 24 hours. You should, however, engage in moderate activity that typically causes your ususal pain. If the block works, those activities should not be painful for several hours after the injection. Do not take a bath, swim, or use a hot tub for 24 hours (you may take a shower). Call the office if you have any of the following: severe pain afterwards (different than your usual symptoms), redness/swelling/discharge at the injection site(s), fevers/chills, difficulty with bowel or bladder functions. ° °What are the risks and side effects? °The complication rate for this procedure is very low. Whenever a needle enters the skin, bleeding or infection can occur. Some other serious but extremely rare risks include paralysis and death. °You may have an allergic reaction to any of the medications used. If you have a known allergy to any medications, especially local anesthetics, notify   our staff before the procedure takes place. °You may experience any of the following side effects up to 4 - 6 hours after the procedure: °• Leg muscle weakness or numbness may occur due to the local anesthetic affecting the nerves that control  your legs (this is a temporary affect and it is not paralysis). If you have any leg weakness or numbness, walk only with assistance in order to prevent falls and injury. Your leg strength will return slowly and completely. °• Dizziness may occur due to a decrease in your blood pressure. If this occurs, remain in a seated or lying position. Gradually sit up, and then stand after at least 10 minutes of sitting. °• Mild headaches may occur. Drink fluids and take pain medications if needed. If the headaches persist or become severe, call the office. °• Mild discomfort at the injection site can occur. This typically lasts for a few hours but can persist for a couple days. If this occurs, take anti-inflammatories or pain medications, apply ice to the area the day of the procedure. If it persists, apply moist heat in the day(s) following. ° °The side effects listed above can be normal. They are not dangerous and will resolve on their own. If, however, you experience any of the following, a complication may have occurred and you should either contact your doctor. If he is not readily available, then you should proceed to the closest urgent care center for evaluation: °• Severe or progressive pain at the injection site(s) °• Arm or leg weakness that progressively worsens or persists for longer than 8 hours °• Severe or progressive redness, swelling, or discharge from the injections site(s) °• Fevers, chills, nausea, or vomiting °• Bowel or bladder dysfunction (i.e. inability to urinate or pass stool or difficulty controlling either) ° °How long does it take for the procedure to work? °You should feel relief from your usual pain within the first hour. Again, this is only expected to last for several hours, at the most. Remember, you may be sore in the middle part of your back from the needles, and you must distinguish this from your usual  pain. °____________________________________________________________________________________________ ° ° °

## 2017-08-24 LAB — COMPLIANCE DRUG ANALYSIS, UR

## 2017-08-29 DIAGNOSIS — M5417 Radiculopathy, lumbosacral region: Secondary | ICD-10-CM | POA: Diagnosis not present

## 2017-09-13 ENCOUNTER — Other Ambulatory Visit: Payer: Self-pay | Admitting: Student

## 2017-09-13 DIAGNOSIS — M5416 Radiculopathy, lumbar region: Secondary | ICD-10-CM

## 2017-09-13 DIAGNOSIS — M5412 Radiculopathy, cervical region: Secondary | ICD-10-CM

## 2017-09-21 ENCOUNTER — Ambulatory Visit
Payer: 59 | Attending: Student in an Organized Health Care Education/Training Program | Admitting: Student in an Organized Health Care Education/Training Program

## 2017-09-21 ENCOUNTER — Encounter: Payer: Self-pay | Admitting: Student in an Organized Health Care Education/Training Program

## 2017-09-21 VITALS — BP 129/56 | HR 96 | Temp 98.4°F | Resp 16 | Ht 69.0 in | Wt 238.0 lb

## 2017-09-21 DIAGNOSIS — M79601 Pain in right arm: Secondary | ICD-10-CM | POA: Diagnosis present

## 2017-09-21 DIAGNOSIS — M48061 Spinal stenosis, lumbar region without neurogenic claudication: Secondary | ICD-10-CM | POA: Diagnosis not present

## 2017-09-21 DIAGNOSIS — G894 Chronic pain syndrome: Secondary | ICD-10-CM | POA: Insufficient documentation

## 2017-09-21 DIAGNOSIS — M7989 Other specified soft tissue disorders: Secondary | ICD-10-CM | POA: Insufficient documentation

## 2017-09-21 DIAGNOSIS — Z981 Arthrodesis status: Secondary | ICD-10-CM | POA: Diagnosis not present

## 2017-09-21 DIAGNOSIS — M5417 Radiculopathy, lumbosacral region: Secondary | ICD-10-CM | POA: Insufficient documentation

## 2017-09-21 DIAGNOSIS — M5412 Radiculopathy, cervical region: Secondary | ICD-10-CM

## 2017-09-21 DIAGNOSIS — M5116 Intervertebral disc disorders with radiculopathy, lumbar region: Secondary | ICD-10-CM | POA: Insufficient documentation

## 2017-09-21 DIAGNOSIS — M4802 Spinal stenosis, cervical region: Secondary | ICD-10-CM | POA: Insufficient documentation

## 2017-09-21 DIAGNOSIS — M961 Postlaminectomy syndrome, not elsewhere classified: Secondary | ICD-10-CM

## 2017-09-21 DIAGNOSIS — M542 Cervicalgia: Secondary | ICD-10-CM | POA: Diagnosis present

## 2017-09-21 DIAGNOSIS — M79605 Pain in left leg: Secondary | ICD-10-CM | POA: Insufficient documentation

## 2017-09-21 DIAGNOSIS — R51 Headache: Secondary | ICD-10-CM | POA: Diagnosis not present

## 2017-09-21 DIAGNOSIS — Z86718 Personal history of other venous thrombosis and embolism: Secondary | ICD-10-CM | POA: Diagnosis not present

## 2017-09-21 DIAGNOSIS — K5909 Other constipation: Secondary | ICD-10-CM | POA: Diagnosis not present

## 2017-09-21 DIAGNOSIS — F329 Major depressive disorder, single episode, unspecified: Secondary | ICD-10-CM | POA: Insufficient documentation

## 2017-09-21 DIAGNOSIS — Z720 Tobacco use: Secondary | ICD-10-CM | POA: Insufficient documentation

## 2017-09-21 DIAGNOSIS — M79604 Pain in right leg: Secondary | ICD-10-CM | POA: Diagnosis not present

## 2017-09-21 DIAGNOSIS — M4722 Other spondylosis with radiculopathy, cervical region: Secondary | ICD-10-CM | POA: Diagnosis not present

## 2017-09-21 DIAGNOSIS — M5416 Radiculopathy, lumbar region: Secondary | ICD-10-CM

## 2017-09-21 DIAGNOSIS — Q761 Klippel-Feil syndrome: Secondary | ICD-10-CM | POA: Diagnosis not present

## 2017-09-21 DIAGNOSIS — M545 Low back pain: Secondary | ICD-10-CM | POA: Diagnosis present

## 2017-09-21 DIAGNOSIS — R569 Unspecified convulsions: Secondary | ICD-10-CM | POA: Insufficient documentation

## 2017-09-21 DIAGNOSIS — I10 Essential (primary) hypertension: Secondary | ICD-10-CM | POA: Diagnosis not present

## 2017-09-21 DIAGNOSIS — Z79899 Other long term (current) drug therapy: Secondary | ICD-10-CM | POA: Diagnosis not present

## 2017-09-21 DIAGNOSIS — M21371 Foot drop, right foot: Secondary | ICD-10-CM | POA: Insufficient documentation

## 2017-09-21 DIAGNOSIS — G43909 Migraine, unspecified, not intractable, without status migrainosus: Secondary | ICD-10-CM | POA: Insufficient documentation

## 2017-09-21 DIAGNOSIS — M546 Pain in thoracic spine: Secondary | ICD-10-CM | POA: Insufficient documentation

## 2017-09-21 MED ORDER — HYDROCODONE-ACETAMINOPHEN 5-325 MG PO TABS: 1.0000 | ORAL_TABLET | Freq: Two times a day (BID) | ORAL | 0 refills | Status: DC | PRN

## 2017-09-21 NOTE — Patient Instructions (Signed)
1. Sign opioid agreement 2. Rx for Norco for 2 months 3. Follow up in 6-8 weeks

## 2017-09-21 NOTE — Progress Notes (Signed)
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 great toe suggesting right L4 and right L5 pathology  Gait & Posture Assessment  Ambulation: Limited Gait: Antalgic Posture: WNL   Lower Extremity Exam    Side: Right lower extremity  Side: Left lower extremity  Skin & Extremity Inspection: Skin color, temperature, and hair growth are WNL. No peripheral edema or cyanosis. No masses, redness, swelling, asymmetry, or associated skin lesions. No contractures.  Skin & Extremity Inspection: Skin color, temperature, and hair growth are WNL. No peripheral edema or cyanosis. No masses, redness, swelling, asymmetry, or associated skin lesions. No contractures.  Functional ROM: Unrestricted ROM          Functional ROM: Unrestricted ROM          Muscle Tone/Strength: Functionally intact. No obvious neuro-muscular anomalies detected.  Muscle Tone/Strength: Functionally intact. No obvious neuro-muscular anomalies detected.  Sensory  (Neurological): Unimpaired  Sensory (Neurological): Unimpaired  Palpation: No palpable anomalies  Palpation: No palpable anomalies     Assessment & Plan  Primary Diagnosis & Pertinent Problem List: The primary encounter diagnosis was History of fusion of lumbar spine. Diagnoses of Failed back surgical syndrome, Lumbar radiculopathy, Cervical fusion syndrome, Cervical radiculopathy, Cervical radiculopathy due to degenerative joint disease of spine, and Chronic pain syndrome were also pertinent to this visit.  Visit Diagnosis: 1. History of fusion of lumbar spine   2. Failed back surgical syndrome   3. Lumbar radiculopathy   4. Cervical fusion syndrome   5. Cervical radiculopathy   6. Cervical radiculopathy due to degenerative joint disease of spine   7. Chronic pain syndrome    General Recommendations: The pain condition that the Teresa Rhodes suffers from is best treated with a multidisciplinary approach that involves an increase in physical activity to prevent de-conditioning and worsening of the pain cycle, as well as psychological counseling (formal and/or informal) to address the co-morbid psychological affects of pain. Treatment will often involve judicious use of pain medications and interventional procedures to decrease the pain, allowing the Teresa Rhodes to participate in the physical activity that will ultimately produce long-lasting pain reductions. The goal of the multidisciplinary approach is to return the Teresa Rhodes to a higher level of overall function and to restore their ability to perform activities of daily living.  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  Teresa Rhodes symptoms are secondary to cervical radiculopathy, cervical fusion  syndrome, failed back surgical syndrome, chronic lumbar radiculopathy.  Teresa Rhodes has tried various medication  including Lyrica, Effexor, Cymbalta, gabapentin, nortriptyline, Flexeril, Robaxin.  These medications had limited benefit or not effective in achieving effective pain control for the Teresa Rhodes.  She has had a series of 9 epidural steroid injections.  She states that these were only helpful for approximately 3-4 weeks.  She has been talked to about spinal cord stimulation but she states that we will not make her right foot drop any better.  We did have a discussion about spinal cord stimulation and I believe that this therapy could be potentially beneficial and helping to reduce the Teresa Rhodes's right lower extremity pain but not necessarily her foot drop symptoms. Teresa Rhodes wants to hold off at this time.  Teresa Rhodes follows up for her second visit.  She has seen pain psychology and is deemed low risk for substance abuse disorder.  She also has a CT myelogram scheduled for this month.  Since her last visit with me, Teresa Rhodes did have an EMG and nerve conduction velocity study performed which shows chronic L5 radiculopathy on the right.   Teresa Rhodes takes Valium rarely.  She did not find tizanidine helpful that was prescribed at the last visit.  She states that it resulted in excessive sedation.  Today we will have the Teresa Rhodes sign an opiate agreement and prescribe hydrocodone 5 mg twice daily as needed for moderate to severe pain.  Prescription provided for 2 months.  Plan: -Sign opioid contract -Hydrocodone prescription as below, 5 mg twice daily as needed, quantity 57-month  Prescription provided for 2 months. -Continue Imitrex as needed for migraine abortion.  -Future opioid considerations could include buprenorphine, oxycodone if hydrocodone is not beneficial. -Future interventional considerations could include spinal cord stimulation, caudal ESI, erector spinae blocks, cervical ESI, cervical  facets   Plan of Care  Pharmacotherapy (Medications Ordered): Meds ordered this encounter  Medications  . DISCONTD: HYDROcodone-acetaminophen (NORCO/VICODIN) 5-325 MG tablet    Sig: Take 1 tablet by mouth 2 (two) times daily as needed for moderate pain or severe pain. For chronic pain To last for 30 days from fill date To fill on or after: 09/21/17, 10/21/17    Dispense:  60 tablet    Refill:  0    Do not place this medication, or any other prescription from our practice, on "Automatic Refill". Teresa Rhodes may have prescription filled one day early if pharmacy is closed on scheduled refill date.  .Marland KitchenHYDROcodone-acetaminophen (NORCO/VICODIN) 5-325 MG tablet    Sig: Take 1 tablet by mouth 2 (two) times daily as needed for moderate pain or severe pain. For chronic pain To last for 30 days from fill date To fill on or after: 09/21/17, 10/21/17    Dispense:  60 tablet    Refill:  0    Do not place this medication, or any other prescription from our practice, on "Automatic Refill". Teresa Rhodes may have prescription filled one day early if pharmacy is closed on scheduled refill date.   Procedure(s) under consideration:  -spinal cord stimulation, caudal ESI, erector spinae blocks, cervical ESI, cervical facets  Provider-requested follow-up: Return in about 8 weeks (around 11/16/2017) for Medication Management.   Time Note: Greater than 50% of the 25 minute(s) of face-to-face time spent with Ms. PWilton was spent in counseling/coordination of care regarding: the appropriate use of the pain scale, opioid tolerance, Ms. Seeman's primary cause of pain, the results of her  recent test(s), the treatment plan, treatment alternatives, medication side effects, the opioid analgesic risks and possible complications, the appropriate use of her medications and the medication agreement.  Future Appointments  Date Time Provider [place]  10/07/2017  8:30 AM [id number]  10/07/2017  9:00 AM [facility]  [facility]  10/07/2017  9:30 AM [facility]  10/07/2017 10:00 AM [facility]  11/15/2017  8:45 AM Teresa Santa, MD [facility] None    Primary Care Physician: Teresa Crouch, MD Location: [facility] Note by: Teresa Rhodes, M.D Date: 09/21/2017; Time: 3:05 PM  Teresa Rhodes Instructions  1. Sign opioid agreement 2. Rx for Norco for 2 months 3. Follow up in 6-8 weeks

## 2017-09-21 NOTE — Progress Notes (Signed)
Safety precautions to be maintained throughout the outpatient stay will include: orient to surroundings, keep bed in low position, maintain call bell within reach at all times, provide assistance with transfer out of bed and ambulation.   Patient states the muscle relaxer that was given at last visit is too sedating and she feels she can only take it when husband is home and he travels for his job.  Also, they are not helping muscle spasms which is why they were prescribed.

## 2017-09-23 ENCOUNTER — Telehealth: Payer: Self-pay | Admitting: *Deleted

## 2017-09-23 NOTE — Telephone Encounter (Signed)
Spoke with patient.  States her pharmacy sent Dr Holley Raring a message and she was calling to see if he had talked to them.  Informed her that Dr Holley Raring is not here today and I would follow up on this Monday.  She has not gotten prescription filled yet.

## 2017-09-23 NOTE — Telephone Encounter (Signed)
Prior Authorization sent and patient notified.

## 2017-10-07 ENCOUNTER — Other Ambulatory Visit: Payer: Self-pay | Admitting: Student

## 2017-10-07 ENCOUNTER — Ambulatory Visit
Admission: RE | Admit: 2017-10-07 | Discharge: 2017-10-07 | Disposition: A | Discharge status: Home / Self Care | Payer: Commercial Managed Care - HMO | Source: Ambulatory Visit | Attending: Student | Admitting: Student

## 2017-10-07 DIAGNOSIS — M5412 Radiculopathy, cervical region: Secondary | ICD-10-CM | POA: Diagnosis not present

## 2017-10-07 DIAGNOSIS — M5416 Radiculopathy, lumbar region: Secondary | ICD-10-CM | POA: Diagnosis present

## 2017-10-07 DIAGNOSIS — M48061 Spinal stenosis, lumbar region without neurogenic claudication: Secondary | ICD-10-CM | POA: Insufficient documentation

## 2017-10-07 DIAGNOSIS — Z981 Arthrodesis status: Secondary | ICD-10-CM | POA: Insufficient documentation

## 2017-10-07 DIAGNOSIS — M5116 Intervertebral disc disorders with radiculopathy, lumbar region: Secondary | ICD-10-CM | POA: Diagnosis not present

## 2017-10-07 MED ORDER — ACETAMINOPHEN 500 MG PO TABS
ORAL_TABLET | ORAL | Status: AC
  Administered 2017-10-07: 1000 mg
  Filled 2017-10-07: qty 2

## 2017-10-07 MED ORDER — IOPAMIDOL (ISOVUE-M 300) INJECTION 61%
15.0000 mL | Freq: Once | INTRAMUSCULAR | Status: AC | PRN
  Administered 2017-10-07: 15 mL via INTRA_ARTICULAR

## 2017-10-07 MED ORDER — LIDOCAINE HCL (PF) 1 % IJ SOLN
5.0000 mL | Freq: Once | INTRAMUSCULAR | Status: AC
  Administered 2017-10-07: 5 mL
  Filled 2017-10-07: qty 5

## 2017-10-07 MED ORDER — ACETAMINOPHEN 500 MG PO TABS: 1000.0000 mg | ORAL_TABLET | Freq: Four times a day (QID) | ORAL | Status: DC | PRN

## 2017-10-07 NOTE — Discharge Instructions (Signed)
Myelogram, Care After °Refer to this sheet in the next few weeks. These instructions provide you with information about caring for yourself after your procedure. Your health care provider may also give you more specific instructions. Your treatment has been planned according to current medical practices, but problems sometimes occur. Call your health care provider if you have any problems or questions after your procedure. °What can I expect after the procedure? °After the procedure, it is common to have: °· Soreness at your injection site. °· A mild headache. ° °Follow these instructions at home: °· Drink enough fluid to keep your urine clear or pale yellow. This will help flush out the dye (contrast material) from your spine. °· Rest as told by your health care provider. Lie flat with your head slightly raised (elevated) to reduce the risk of headache. °· Do not bend, lift, or do any strenuous activity for 24-48 hours or as told by your health care provider. °· Take over-the-counter and prescription medicines only as told by your health care provider. °· Take care of and remove your bandage (dressing) as told by your health care provider. °· Bathe or shower as told by your health care provider. °Contact a health care provider if: °· You have a fever. °· You have a headache that lasts longer than 24 hours. °· You feel nauseous or vomit. °· You have a stiff neck or numbness in your legs. °· You are unable to urinate or have a bowel movement. °· You develop a rash, itching, or sneezing. °Get help right away if: °· You have new symptoms or your symptoms get worse. °· You have a seizure. °· You have trouble breathing. °This information is not intended to replace advice given to you by your health care provider. Make sure you discuss any questions you have with your health care provider. °Document Released: 08/01/2015 Document Revised: 12/11/2015 Document Reviewed: 04/17/2015 °[employer] Interactive Patient Education ©  2018 [employer] ° °

## 2017-10-30 DIAGNOSIS — J029 Acute pharyngitis, unspecified: Secondary | ICD-10-CM | POA: Diagnosis not present

## 2017-11-15 ENCOUNTER — Encounter: Payer: Self-pay | Admitting: Student in an Organized Health Care Education/Training Program

## 2017-11-15 ENCOUNTER — Other Ambulatory Visit: Payer: Self-pay

## 2017-11-15 ENCOUNTER — Ambulatory Visit
Payer: Commercial Managed Care - HMO | Attending: Student in an Organized Health Care Education/Training Program | Admitting: Student in an Organized Health Care Education/Training Program

## 2017-11-15 VITALS — BP 116/66 | HR 93 | Temp 98.2°F | Ht 68.0 in | Wt 230.0 lb

## 2017-11-15 DIAGNOSIS — M5417 Radiculopathy, lumbosacral region: Secondary | ICD-10-CM | POA: Diagnosis not present

## 2017-11-15 DIAGNOSIS — Z981 Arthrodesis status: Secondary | ICD-10-CM

## 2017-11-15 DIAGNOSIS — Z87891 Personal history of nicotine dependence: Secondary | ICD-10-CM | POA: Diagnosis not present

## 2017-11-15 DIAGNOSIS — F329 Major depressive disorder, single episode, unspecified: Secondary | ICD-10-CM | POA: Insufficient documentation

## 2017-11-15 DIAGNOSIS — Z9049 Acquired absence of other specified parts of digestive tract: Secondary | ICD-10-CM | POA: Diagnosis not present

## 2017-11-15 DIAGNOSIS — M5416 Radiculopathy, lumbar region: Secondary | ICD-10-CM | POA: Diagnosis not present

## 2017-11-15 DIAGNOSIS — M5412 Radiculopathy, cervical region: Secondary | ICD-10-CM | POA: Insufficient documentation

## 2017-11-15 DIAGNOSIS — Q761 Klippel-Feil syndrome: Secondary | ICD-10-CM

## 2017-11-15 DIAGNOSIS — G894 Chronic pain syndrome: Secondary | ICD-10-CM | POA: Insufficient documentation

## 2017-11-15 DIAGNOSIS — M542 Cervicalgia: Secondary | ICD-10-CM | POA: Diagnosis present

## 2017-11-15 DIAGNOSIS — M961 Postlaminectomy syndrome, not elsewhere classified: Secondary | ICD-10-CM

## 2017-11-15 DIAGNOSIS — Z5181 Encounter for therapeutic drug level monitoring: Secondary | ICD-10-CM | POA: Insufficient documentation

## 2017-11-15 DIAGNOSIS — Z9889 Other specified postprocedural states: Secondary | ICD-10-CM | POA: Diagnosis not present

## 2017-11-15 DIAGNOSIS — M21379 Foot drop, unspecified foot: Secondary | ICD-10-CM | POA: Diagnosis not present

## 2017-11-15 DIAGNOSIS — R569 Unspecified convulsions: Secondary | ICD-10-CM | POA: Insufficient documentation

## 2017-11-15 DIAGNOSIS — K5903 Drug induced constipation: Secondary | ICD-10-CM

## 2017-11-15 DIAGNOSIS — M4722 Other spondylosis with radiculopathy, cervical region: Secondary | ICD-10-CM | POA: Diagnosis not present

## 2017-11-15 DIAGNOSIS — T402X5A Adverse effect of other opioids, initial encounter: Secondary | ICD-10-CM | POA: Insufficient documentation

## 2017-11-15 DIAGNOSIS — Z79899 Other long term (current) drug therapy: Secondary | ICD-10-CM | POA: Insufficient documentation

## 2017-11-15 DIAGNOSIS — I10 Essential (primary) hypertension: Secondary | ICD-10-CM | POA: Insufficient documentation

## 2017-11-15 MED ORDER — HYDROCODONE-ACETAMINOPHEN 5-325 MG PO TABS: 1.0000 | ORAL_TABLET | Freq: Three times a day (TID) | ORAL | 0 refills | Status: DC | PRN

## 2017-11-15 MED ORDER — NALOXEGOL OXALATE 25 MG PO TABS: 25.0000 mg | ORAL_TABLET | Freq: Every day | ORAL | 0 refills | Status: AC

## 2017-11-15 NOTE — Progress Notes (Deleted)
Patient's Name: Teresa Rhodes  MRN: [id number]  Referring Provider: Idelle Crouch, MD  DOB: [birth year]  PCP: Idelle Crouch, MD  DOS: 11/15/2017  Note by: Gillis Santa, MD  Service setting: Ambulatory outpatient  Specialty: Interventional Pain Management  Location: [facility] (AMB) Pain Management Facility    Patient type: Established   Primary Reason(s) for Visit: Encounter for prescription drug management. (Level of risk: moderate)  CC: Neck Pain (lower and shoulder)  HPI  Teresa Rhodes is a 47 y.o. year old, female patient, who comes today for a medication management evaluation. She has White matter abnormality on MRI of brain; Foot drop; Right arm weakness; Lumbosacral radiculopathy; Back pain; Degeneration of intervertebral disc of lumbosacral region; Headache, migraine; Current tobacco use; Nausea & vomiting; Chronic idiopathic constipation; Deep vein thrombosis (DVT) of left lower extremity ([place]); Difficulty sleeping; Headache disorder; Leg swelling; Pinched nerve; Right leg pain; and Seizure ([place]) on their problem list. Her primarily concern today is the Neck Pain (lower and shoulder)  Pain Assessment: Location: Lower, Right Neck(shoulder, back, right leg and foot) Radiating: radiates down right leg to foot Onset: More than a month ago Duration: Chronic pain Quality: Aching, Burning, Sharp, Stabbing, Shooting, Tingling(morning the pain is burning and aching, as the day goes on the pain changes to stabbing , sharp, shooting pain) Severity: 3 /10 (self-reported pain score)  Note: Reported level is compatible with observation.                         When using our objective Pain Scale, levels between 6 and 10/10 are said to belong in an emergency room, as it progressively worsens from a 6/10, described as severely limiting, requiring emergency care not usually available at an outpatient pain management facility. At a 6/10 level, communication becomes difficult and requires great effort.  Assistance to reach the emergency department may be required. Facial flushing and profuse sweating along with potentially dangerous increases in heart rate and blood pressure will be evident. Effect on ADL: had to push through daily activities Timing: Constant Modifying factors: heating pad, moist heat wrap, meds  Teresa Rhodes was last scheduled for an appointment on 09/21/2017 for medication management. During today's appointment we reviewed Teresa Rhodes's chronic pain status, as well as her outpatient medication regimen.  The patient  reports that she does not use drugs. Her body mass index is 34.97 kg/m.  Further details on both, my assessment(s), as well as the proposed treatment plan, please see below.  Controlled Substance Pharmacotherapy Assessment REMS (Risk Evaluation and Mitigation Strategy)  Analgesic: *** MME/day: *** mg/day.  Chauncey Fischer, RN  11/15/2017  8:54 AM  Sign at close encounter Nursing Pain Medication Assessment:  Safety precautions to be maintained throughout the outpatient stay will include: orient to surroundings, keep bed in low position, maintain call bell within reach at all times, provide assistance with transfer out of bed and ambulation.  Medication Inspection Compliance: Pill count conducted under aseptic conditions, in front of the patient. Neither the pills nor the bottle was removed from the patient's sight at any time. Once count was completed pills were immediately returned to the patient in their original bottle.  Medication: Hydrocodone/APAP Pill/Patch Count: 26 of 60 pills remain Pill/Patch Appearance: Markings consistent with prescribed medication Bottle Appearance: Standard pharmacy container. Clearly labeled. Filled Date: 04 / 09 / 2019 Last Medication intake:  Yesterday Pharmacokinetics: Liberation and absorption (onset of action): WNL Distribution (time to peak effect):  WNL Metabolism and excretion (duration of action): WNL          Pharmacodynamics: Desired effects: Analgesia: Teresa Rhodes reports >50% benefit. Functional ability: Patient reports that medication allows her to accomplish basic ADLs Clinically meaningful improvement in function (CMIF): Sustained CMIF goals met Perceived effectiveness: Described as relatively effective, allowing for increase in activities of daily living (ADL) Undesirable effects: Side-effects or Adverse reactions: None reported Monitoring: [place] PMP: Online review of the past 37-monthperiod conducted. Compliant with practice rules and regulations Last UDS on record: Summary  Date Value Ref Range Status  08/18/2017 FINAL  Final    Comment:    ==================================================================== TOX[facility]RE COMP DRUG ANALYSIS,UR ==================================================================== Test                             Result       Flag       Units Drug Present and Declared for Prescription Verification   Amphetamine                    530          EXPECTED   ng/mg creat    Amphetamine is available as a schedule II prescription drug.   Desmethyldiazepam              61           EXPECTED   ng/mg creat   Oxazepam                       198          EXPECTED   ng/mg creat   Temazepam                      66           EXPECTED   ng/mg creat    Desmethyldiazepam, oxazepam, and temazepam are expected    metabolites of diazepam. Desmethyldiazepam and oxazepam are also    expected metabolites of other drugs, including chlordiazepoxide,    prazepam, clorazepate, and halazepam. Oxazepam is an expected    metabolite of temazepam. Oxazepam and temazepam are also    available as scheduled prescription medications. Drug Absent but Declared for Prescription Verification   Tizanidine                     Not Detected UNEXPECTED    Tizanidine, as indicated in the declared medication list, is not    always detected even when used as  directed. ==================================================================== Test                      Result    Flag   Units      Ref Range   Creatinine              158              mg/dL      >=20 ==================================================================== Declared Medications:  The flagging and interpretation on this report are based on the  following declared medications.  Unexpected results may arise from  inaccuracies in the declared medications.  **Note: The testing scope of this panel includes these medications:  Amphetamine (Adderall)  Diazepam  **Note: The testing scope of this panel does not include small to  moderate amounts of these reported medications:  Tizanidine  **Note: The testing scope of this panel does not  include following  reported medications:  Bisoprolol (Ziac)  Hydrochlorothiazide (Ziac)  Losartan (Losartan Potassium)  Omeprazole  Ranitidine  Sumatriptan ==================================================================== For clinical consultation, please call [phone]. ====================================================================    UDS interpretation: Compliant          Medication Assessment Form: Reviewed. Patient indicates being compliant with therapy Treatment compliance: Compliant Risk Assessment Profile: Aberrant behavior: See prior evaluations. None observed or detected today Comorbid factors increasing risk of overdose: See prior notes. No additional risks detected today Risk of substance use disorder (SUD): Low  ORT Scoring interpretation table:  Score <3 = Low Risk for SUD  Score between 4-7 = Moderate Risk for SUD  Score >8 = High Risk for Opioid Abuse   Risk Mitigation Strategies:  Patient Counseling: Covered Patient-Prescriber Agreement (PPA): Present and active  Notification to other healthcare providers: Done  Pharmacologic Plan: No change in therapy, at this time.             Laboratory Chemistry   Inflammation Markers (CRP: Acute Phase) (ESR: Chronic Phase) Lab Results  Component Value Date   LATICACIDVEN 1.21 11/01/2014                         Rheumatology Markers No results found for: RF, ANA, LABURIC, URICUR, LYMEIGGIGMAB, [facility]                      Renal Function Markers Lab Results  Component Value Date   BUN <5 (L) 04/08/2017   CREATININE 0.55 04/08/2017   GFRAA >60 04/08/2017   GFRNONAA >60 04/08/2017                              Hepatic Function Markers Lab Results  Component Value Date   AST 27 04/07/2017   ALT 30 04/07/2017   ALBUMIN 4.1 04/07/2017   ALKPHOS 90 04/07/2017   LIPASE 22 04/07/2017                        Electrolytes Lab Results  Component Value Date   NA 143 04/08/2017   K 3.9 04/08/2017   CL 110 04/08/2017   CALCIUM 8.4 (L) 04/08/2017                        Neuropathy Markers Lab Results  Component Value Date   XBJYNWGN56 213 04/16/2014   HIV Non Reactive 04/08/2017                        Bone Pathology Markers No results found for: [place], [id number], XB2841LK4, MW1027OZ3, 25OHVITD1, 25OHVITD2, 25OHVITD3, TESTOFREE, TESTOSTERONE                       Coagulation Parameters Lab Results  Component Value Date   PLT 198 04/08/2017                        Cardiovascular Markers Lab Results  Component Value Date   TROPONINI < 0.02 11/17/2013   HGB 13.1 04/08/2017   HCT 37.5 04/08/2017                         CA Markers No results found for: CEA, CA125, LABCA2  Note: Lab results reviewed.  Recent Diagnostic Imaging Results  CT CERVICAL SPINE WO CONTRAST CLINICAL DATA:  47 year old female with spine pain. Pain radiating to the right leg with numbness. Prior cervical and lumbar spine surgery without significant improvement.  PROCEDURE: CT CERVICAL MYELOGRAM  CT LUMBAR MYELOGRAM  Lumbar puncture, intrathecal contrast administration, and plain myelogram imaging were performed by Dr. Kathreen Devoid and are reported separately, along with fluoroscopy time.  COMPARISON:  Brain MRI 09/16/2016. CT Abdomen and Pelvis 04/07/2017. Lumbar MRI 12/14/2016, and earlier.  TECHNIQUE: Contiguous axial images were obtained through the Cervical and Lumbar spine after the intrathecal infusion of infusion. Coronal and sagittal reconstructions were obtained of the axial image sets.  FINDINGS: CT CERVICAL MYELOGRAM FINDINGS:  Expected subarachnoid contrast in the cisterna magna and visible basilar cisterns.  Grossly normal visible posterior fossa structures.  Mild thyromegaly. No discrete thyroid nodule is evident. Otherwise negative noncontrast paraspinal soft tissues. No cervical lymphadenopathy. Negative noncontrast thoracic inlet. Negative lung apices.  Straightening of cervical lordosis. No acute osseous abnormality identified. Postoperative details are below.  Good intrathecal contrast opacification. Normal myelographic appearance of the cervical and upper thoracic spinal cord.  C2-C3:  Minimal right foraminal endplate spurring.  No stenosis.  C3-C4: Minimal circumferential disc bulge and endplate spurring. Borderline to mild facet hypertrophy. No stenosis.  C4-C5: Subtle anterolisthesis. Disc space loss but mild circumferential disc bulge. Endplate spurring involving both foramina. Moderate left and mild right facet hypertrophy. Mild left ligament flavum hypertrophy. No significant spinal stenosis. Moderate to severe left C5 foraminal stenosis. Mild right foraminal stenosis.  C5-C6: Previous ACDF with densely radiopaque interbody implant. No hardware loosening. Solid posterior element arthrodesis. No stenosis is evident.  C6-C7: Previous ACDF with dense radiopaque interbody implant. No hardware loosening. Solid posterior element arthrodesis. No stenosis is evident.  C7-T1: Mild to moderate facet hypertrophy greater on the right. Minor endplate spurring. No spinal  stenosis. No convincing foraminal stenosis.  T1-T2: Mild facet hypertrophy. Mild endplate spurring. No convincing foraminal stenosis. No spinal stenosis.  T2-T3: Mild to moderate facet hypertrophy greater on the right. No stenosis.  CT LUMBAR MYELOGRAM FINDINGS:  For the purposes of this report the same numbering system will be used as on the lumbar MRI in 2018. 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 The previous fused level is L4-L5.  No acute osseous abnormality identified. Stable vertebral height and alignment since 2018. Visible sacrum and SI joints are intact. Postoperative details are below.  Negative visible noncontrast abdominal and pelvic viscera. Postoperative changes to the posterior paraspinal soft tissues at L4 and L5. Small volume of extra thecal contrast along to the right of midline at the L4-L5 level is along the lumbar puncture trajectory and incidental.  Good intrathecal contrast opacification. Normal myelographic appearance of the lower thoracic spinal cord, and the conus at L1. Trace intrathecal gas related to injection.  T11-T12: Mild to moderate facet hypertrophy.  No stenosis.  T12-L1:  Mild facet and ligament flavum hypertrophy.  No stenosis.  L1-L2:  Mild facet hypertrophy.  No stenosis.  L2-L3: Circumferential but mostly far lateral disc bulging greater on the right. Mild facet hypertrophy, greater on the right. Right far lateral endplate spurring. No spinal or lateral recess stenosis. No foraminal stenosis.  L3-L4: Disc space loss and circumferential disc bulge with mild endplate spurring. Broad-based posterior and foraminal involvement. Moderate facet and ligament flavum hypertrophy. No spinal or convincing lateral recess stenosis. Mild left and borderline to mild right L3 neural foraminal stenosis.  L4-L5: Previous posterior and interbody fusion with right laminectomy. Bilateral transpedicular hardware is in place with no loosening. Interbody  implant is in place and appears satisfactory. There is interbody calcification but no convincing interbody arthrodesis. However, there does may be developing arthrodesis through the left posterior elements (sagittal image 42). No spinal stenosis. No lateral recess stenosis is evident. No significant foraminal stenosis.  L5-S1: Mild disc space loss with circumferential disc bulge and endplate spurring. Severe facet hypertrophy greater on the left. Bilateral side vacuum facet. Left facet subchondral sclerosis, and bulky facet overgrowth. Mild epidural lipomatosis. No spinal stenosis. There is mild to moderate left and mild right L5 neural foraminal stenosis primarily due to bony overgrowth. No convincing lateral recess stenosis.  IMPRESSION: Postmyelogram CTs of the cervical and lumbar spine:  CERVICAL SPINE:  1. Prior cervical ACDF at C5-C6 and C6-C7 with solid arthrodesis and no hardware loosening. 2. Mild overall cervical spine adjacent segment disease, more pronounced at C4-C5 where subtle anterolisthesis is associated with moderate facet arthropathy greater on the left. No spinal stenosis, but moderate to severe left C5 neural foraminal stenosis. 3. No cervical spinal stenosis and no other foraminal stenosis.  LUMBAR SPINE:  1. Transitional lumbosacral anatomy. 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 2. Posterior and interbody fusion sequelae at L4-L5 with no hardware loosening but no convincing arthrodesis. Questionable developing posterior element arthrodesis on the left. 3. Mild adjacent segment disease at L3-L4 with only mild L3 neural foraminal stenosis. 4. Adjacent segment disease at L5-S1 with severe facet arthropathy greater on the left. Bilateral vacuum facet. No convincing spinal or lateral recess stenosis. Up to moderate left and mild right L5 neural foraminal  stenosis.  Electronically Signed   By: Genevie Ann M.D.   On: 10/07/2017 12:30 CT LUMBAR SPINE WO CONTRAST CLINICAL DATA:  47 year old female with spine pain. Pain radiating to the right leg with numbness. Prior cervical and lumbar spine surgery without significant improvement.  PROCEDURE: CT CERVICAL MYELOGRAM  CT LUMBAR MYELOGRAM  Lumbar puncture, intrathecal contrast administration, and plain myelogram imaging were performed by Dr. Kathreen Devoid and are reported separately, along with fluoroscopy time.  COMPARISON:  Brain MRI 09/16/2016. CT Abdomen and Pelvis 04/07/2017. Lumbar MRI 12/14/2016, and earlier.  TECHNIQUE: Contiguous axial images were obtained through the Cervical and Lumbar spine after the intrathecal infusion of infusion. Coronal and sagittal reconstructions were obtained of the axial image sets.  FINDINGS: CT CERVICAL MYELOGRAM FINDINGS:  Expected subarachnoid contrast in the cisterna magna and visible basilar cisterns.  Grossly normal visible posterior fossa structures.  Mild thyromegaly. No discrete thyroid nodule is evident. Otherwise negative noncontrast paraspinal soft tissues. No cervical lymphadenopathy. Negative noncontrast thoracic inlet. Negative lung apices.  Straightening of cervical lordosis. No acute osseous abnormality identified. Postoperative details are below.  Good intrathecal contrast opacification. Normal myelographic appearance of the cervical and upper thoracic spinal cord.  C2-C3:  Minimal right foraminal endplate spurring.  No stenosis.  C3-C4: Minimal circumferential disc bulge and endplate spurring. Borderline to mild facet hypertrophy. No stenosis.  C4-C5: Subtle anterolisthesis. Disc space loss but mild circumferential disc bulge. Endplate spurring involving both foramina. Moderate left and mild right facet hypertrophy. Mild left ligament flavum hypertrophy. No significant spinal stenosis. Moderate to severe left C5  foraminal stenosis. Mild right foraminal stenosis.  C5-C6: Previous ACDF with densely radiopaque interbody implant. No hardware loosening. Solid posterior element arthrodesis. No stenosis is evident.  C6-C7: Previous ACDF with dense radiopaque interbody implant. No hardware loosening. Solid posterior element arthrodesis. No stenosis is evident.  C7-T1: Mild to moderate facet hypertrophy greater on the right. Minor endplate spurring. No spinal stenosis. No convincing foraminal stenosis.  T1-T2: Mild facet hypertrophy. Mild endplate spurring. No convincing foraminal stenosis. No spinal stenosis.  T2-T3: Mild to moderate facet hypertrophy greater on the right. No stenosis.  CT LUMBAR MYELOGRAM FINDINGS:  For the purposes of this report the same numbering system will be used as on the lumbar MRI in 2018. 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 The previous fused level is L4-L5.  No acute osseous abnormality identified. Stable vertebral height and alignment since 2018. Visible sacrum and SI joints are intact. Postoperative details are below.  Negative visible noncontrast abdominal and pelvic viscera. Postoperative changes to the posterior paraspinal soft tissues at L4 and L5. Small volume of extra thecal contrast along to the right of midline at the L4-L5 level is along the lumbar puncture trajectory and incidental.  Good intrathecal contrast opacification. Normal myelographic appearance of the lower thoracic spinal cord, and the conus at L1. Trace intrathecal gas related to injection.  T11-T12: Mild to moderate facet hypertrophy.  No stenosis.  T12-L1:  Mild facet and ligament flavum hypertrophy.  No stenosis.  L1-L2:  Mild facet hypertrophy.  No stenosis.  L2-L3: Circumferential but mostly far lateral disc bulging greater on the right. Mild facet hypertrophy, greater on the right. Right far lateral endplate spurring. No spinal or lateral recess stenosis. No foraminal  stenosis.  L3-L4: Disc space loss and circumferential disc bulge with mild endplate spurring. Broad-based posterior and foraminal involvement. Moderate facet and ligament flavum hypertrophy. No spinal or convincing lateral recess stenosis. Mild left and borderline to mild right L3 neural foraminal stenosis.  L4-L5: Previous posterior and interbody fusion with right laminectomy. Bilateral transpedicular hardware is in place with no loosening. Interbody implant is in place and appears satisfactory. There is interbody calcification but no convincing interbody arthrodesis. However, there does may be developing arthrodesis through the left posterior elements (sagittal image 42). No spinal stenosis. No lateral recess stenosis is evident. No significant foraminal stenosis.  L5-S1: Mild disc space loss with circumferential disc bulge and endplate spurring. Severe facet hypertrophy greater on the left. Bilateral side vacuum facet. Left facet subchondral sclerosis, and bulky facet overgrowth. Mild epidural lipomatosis. No spinal stenosis. There is mild to moderate left and mild right L5 neural foraminal stenosis primarily due to bony overgrowth. No convincing lateral recess stenosis.  IMPRESSION: Postmyelogram CTs of the cervical and lumbar spine:  CERVICAL SPINE:  1. Prior cervical ACDF at C5-C6 and C6-C7 with solid arthrodesis and no hardware loosening. 2. Mild overall cervical spine adjacent segment disease, more pronounced at C4-C5 where subtle anterolisthesis is associated with moderate facet arthropathy greater on the left. No spinal stenosis, but moderate to severe left C5 neural foraminal stenosis. 3. No cervical spinal stenosis and no other foraminal stenosis.  LUMBAR SPINE:  1. Transitional lumbosacral anatomy. 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 2. Posterior and interbody fusion sequelae at  L4-L5 with no hardware loosening but no convincing arthrodesis. Questionable developing posterior element arthrodesis on the left. 3. Mild adjacent segment disease at L3-L4 with only mild L3 neural foraminal stenosis. 4. Adjacent segment disease at L5-S1 with severe facet arthropathy greater on the left. Bilateral vacuum facet. No convincing spinal or lateral recess  stenosis. Up to moderate left and mild right L5 neural foraminal stenosis.  Electronically Signed   By: Genevie Ann M.D.   On: 10/07/2017 12:30 DG Myelogram Lumbar CLINICAL DATA:  Cervical and lumbar radiculopathy.  FLUOROSCOPY TIME:  30.2 mGy  PROCEDURE: LUMBAR PUNCTURE FOR CERVICAL AND LUMBAR MYELOGRAM  CERVICAL AND LUMBAR MYELOGRAM  After thorough discussion of risks and benefits of the procedure including bleeding, infection, injury to nerves, blood vessels, adjacent structures as well as headache and CSF leak, written and oral informed consent was obtained. Consent was obtained by Dr. Kathreen Devoid.  Patient was positioned prone on the fluoroscopy table. Local anesthesia was provided with 1% lidocaine without epinephrine after prepped and draped in the usual sterile fashion. Puncture was performed at L4-5 using a 5 inch 22-gauge spinal needle via midline approach. Using a single pass through the dura, the needle was placed within the thecal sac, with return of clear CSF. 10 mL of Isovue-300 M was injected into the thecal sac, with normal opacification of the nerve roots and cauda equina consistent with free flow within the subarachnoid space. The patient was then moved to the trendelenburg position and contrast flowed into the Cervical spine region.  I personally performed the lumbar puncture and administered the intrathecal contrast. I also personally supervised acquisition of the myelogram images.  FINDINGS: Intrathecal contrast is identified in the lumbar spine and cervical spine. Patient is transported to  CT for a CT of cervical spine and lumbar spine dictated separately.  IMPRESSION: Successful cervical and lumbar myelogram prior to CT scan of the cervical spine and lumbar spine.  Electronically Signed   By: Kathreen Devoid   On: 10/07/2017 09:25 DG Myelogram Cervical CLINICAL DATA:  Cervical and lumbar radiculopathy.  FLUOROSCOPY TIME:  30.2 mGy  PROCEDURE: LUMBAR PUNCTURE FOR CERVICAL AND LUMBAR MYELOGRAM  CERVICAL AND LUMBAR MYELOGRAM  After thorough discussion of risks and benefits of the procedure including bleeding, infection, injury to nerves, blood vessels, adjacent structures as well as headache and CSF leak, written and oral informed consent was obtained. Consent was obtained by Dr. Kathreen Devoid.  Patient was positioned prone on the fluoroscopy table. Local anesthesia was provided with 1% lidocaine without epinephrine after prepped and draped in the usual sterile fashion. Puncture was performed at L4-5 using a 5 inch 22-gauge spinal needle via midline approach. Using a single pass through the dura, the needle was placed within the thecal sac, with return of clear CSF. 10 mL of Isovue-300 M was injected into the thecal sac, with normal opacification of the nerve roots and cauda equina consistent with free flow within the subarachnoid space. The patient was then moved to the trendelenburg position and contrast flowed into the Cervical spine region.  I personally performed the lumbar puncture and administered the intrathecal contrast. I also personally supervised acquisition of the myelogram images.  FINDINGS: Intrathecal contrast is identified in the lumbar spine and cervical spine. Patient is transported to CT for a CT of cervical spine and lumbar spine dictated separately.  IMPRESSION: Successful cervical and lumbar myelogram prior to CT scan of the cervical spine and lumbar spine.  Electronically Signed   By: Kathreen Devoid   On: 10/07/2017  09:25  Complexity Note: Imaging results reviewed. Results shared with Ms. Higham, using Layman's terms.                         Meds   Current Outpatient Medications:  .  amphetamine-dextroamphetamine (ADDERALL) 10 MG tablet, Take 10 mg by mouth 2 (two) times daily with a meal., Disp: , Rfl:  .  azelastine (ASTELIN) 0.1 % nasal spray, Place 1 spray into both nostrils daily., Disp: , Rfl:  .  diazepam (VALIUM) 5 MG tablet, Take 5 mg by mouth every 12 (twelve) hours as needed for anxiety. , Disp: , Rfl:  .  HYDROcodone-acetaminophen (NORCO/VICODIN) 5-325 MG tablet, Take 1 tablet by mouth 2 (two) times daily as needed for moderate pain or severe pain. For chronic pain To last for 30 days from fill date To fill on or after: 09/21/17, 10/21/17, Disp: 60 tablet, Rfl: 0 .  losartan-hydrochlorothiazide (HYZAAR) 100-25 MG tablet, Take 1 tablet by mouth daily., Disp: , Rfl:  .  omeprazole (PRILOSEC) 40 MG capsule, Take 40 mg by mouth daily., Disp: , Rfl:  .  ranitidine (ZANTAC) 300 MG tablet, Take by mouth., Disp: , Rfl:  .  SUMAtriptan (IMITREX) 100 MG tablet, Take 1/2-1 tab at headache onset, may repeat once if needed in 2 hours.  No more than 2 in 24 hours., Disp: , Rfl:  .  tiZANidine (ZANAFLEX) 4 MG tablet, Take 1 tablet (4 mg total) by mouth every 8 (eight) hours as needed for muscle spasms., Disp: 90 tablet, Rfl: 1 .  bisoprolol-hydrochlorothiazide (ZIAC) 5-6.25 MG tablet, Take 1 tablet by mouth daily., Disp: , Rfl: 11 .  omeprazole (PRILOSEC) 40 MG capsule, Take 1 capsule (40 mg total) by mouth daily. Then take once a day as before, Disp: 30 capsule, Rfl: 0  ROS  Constitutional: Denies any fever or chills Gastrointestinal: No reported hemesis, hematochezia, vomiting, or acute GI distress Musculoskeletal: Denies any acute onset joint swelling, redness, loss of ROM, or weakness Neurological: No reported episodes of acute onset apraxia, aphasia, dysarthria, agnosia, amnesia, paralysis, loss of  coordination, or loss of consciousness  Allergies  Ms. Awan is allergic to robaxin [methocarbamol].  PFSH  Drug: Ms. Ekstein  reports that she does not use drugs. Alcohol:  reports that she does not drink alcohol. Tobacco:  reports that she quit smoking about 3 years ago. Her smoking use included cigarettes. She has a 1.50 pack-year smoking history. She has never used smokeless tobacco. Medical:  has a past medical history of Complication of anesthesia, Depression, Gallstones, Hypertension, Migraine, and Seizures ([place]) (2010-2013). Surgical: Ms. Stetzel  has a past surgical history that includes Cholecystectomy (2002); Neck surgery (2005 & 2009); Back surgery; Esophagogastroduodenoscopy (egd) with propofol (N/A, 04/10/2017); and Colonoscopy with propofol (N/A, 04/10/2017). Family: family history includes Kidney disease (age of onset: 50) in her brother; Kidney disease (age of onset: 20) in her father; Liver disease in her maternal grandfather; Ovarian cancer in her mother; Ulcerative colitis in her mother.  Constitutional Exam  General appearance: Well nourished, well developed, and well hydrated. In no apparent acute distress Vitals:   11/15/17 0852  BP: 116/66  Pulse: 93  Temp: 98.2 F (36.8 C)  SpO2: 100%  Weight: 230 lb (104.3 kg)  Height: _0  (1.727 m)   BMI Assessment: Estimated body mass index is 34.97 kg/m as calculated from the following:   Height as of this encounter: _1  (1.727 m).   Weight as of this encounter: 230 lb (104.3 kg).  BMI interpretation table: BMI level Category Range association with higher incidence of chronic pain  <18 kg/m2 Underweight   18.5-24.9 kg/m2 Ideal body weight   25-29.9 kg/m2 Overweight Increased incidence by 20%  30-34.9 kg/m2 Obese (Class I)  Increased incidence by 68%  35-39.9 kg/m2 Severe obesity (Class II) Increased incidence by 136%  >40 kg/m2 Extreme obesity (Class III) Increased incidence by 254%   Patient's current BMI Ideal Body  weight  Body mass index is 34.97 kg/m. Ideal body weight: 63.9 kg (140 lb 14 oz) Adjusted ideal body weight: 80.1 kg (176 lb 8.4 oz)   BMI Readings from Last 4 Encounters:  11/15/17 34.97 kg/m  10/07/17 35.15 kg/m  09/21/17 35.15 kg/m  08/18/17 36.92 kg/m   Wt Readings from Last 4 Encounters:  11/15/17 230 lb (104.3 kg)  10/07/17 238 lb (108 kg)  09/21/17 238 lb (108 kg)  08/18/17 250 lb (113.4 kg)  Psych/Mental status: Alert, oriented x 3 (person, place, & time)       Eyes: PERLA Respiratory: No evidence of acute respiratory distress  Cervical Spine Area Exam  Skin & Axial Inspection: No masses, redness, edema, swelling, or associated skin lesions Alignment: Symmetrical Functional ROM: Unrestricted ROM      Stability: No instability detected Muscle Tone/Strength: Functionally intact. No obvious neuro-muscular anomalies detected. Sensory (Neurological): Unimpaired Palpation: No palpable anomalies              Upper Extremity (UE) Exam    Side: Right upper extremity  Side: Left upper extremity  Skin & Extremity Inspection: Skin color, temperature, and hair growth are WNL. No peripheral edema or cyanosis. No masses, redness, swelling, asymmetry, or associated skin lesions. No contractures.  Skin & Extremity Inspection: Skin color, temperature, and hair growth are WNL. No peripheral edema or cyanosis. No masses, redness, swelling, asymmetry, or associated skin lesions. No contractures.  Functional ROM: Unrestricted ROM          Functional ROM: Unrestricted ROM          Muscle Tone/Strength: Functionally intact. No obvious neuro-muscular anomalies detected.  Muscle Tone/Strength: Functionally intact. No obvious neuro-muscular anomalies detected.  Sensory (Neurological): Unimpaired          Sensory (Neurological): Unimpaired          Palpation: No palpable anomalies              Palpation: No palpable anomalies              Specialized Test(s): Deferred         Specialized Test(s):  Deferred          Thoracic Spine Area Exam  Skin & Axial Inspection: No masses, redness, or swelling Alignment: Symmetrical Functional ROM: Unrestricted ROM Stability: No instability detected Muscle Tone/Strength: Functionally intact. No obvious neuro-muscular anomalies detected. Sensory (Neurological): Unimpaired Muscle strength & Tone: No palpable anomalies  Lumbar Spine Area Exam  Skin & Axial Inspection: No masses, redness, or swelling Alignment: Symmetrical Functional ROM: Unrestricted ROM       Stability: No instability detected Muscle Tone/Strength: Functionally intact. No obvious neuro-muscular anomalies detected. Sensory (Neurological): Unimpaired Palpation: No palpable anomalies       Provocative Tests: Lumbar Hyperextension and rotation test: evaluation deferred today       Lumbar Lateral bending test: evaluation deferred today       Patrick's Maneuver: evaluation deferred today                    Gait & Posture Assessment  Ambulation: Unassisted Gait: Relatively normal for age and body habitus Posture: WNL   Lower Extremity Exam    Side: Right lower extremity  Side: Left lower extremity  Skin & Extremity Inspection: Skin color, temperature,  and hair growth are WNL. No peripheral edema or cyanosis. No masses, redness, swelling, asymmetry, or associated skin lesions. No contractures.  Skin & Extremity Inspection: Skin color, temperature, and hair growth are WNL. No peripheral edema or cyanosis. No masses, redness, swelling, asymmetry, or associated skin lesions. No contractures.  Functional ROM: Unrestricted ROM          Functional ROM: Unrestricted ROM          Muscle Tone/Strength: Functionally intact. No obvious neuro-muscular anomalies detected.  Muscle Tone/Strength: Functionally intact. No obvious neuro-muscular anomalies detected.  Sensory (Neurological): Unimpaired  Sensory (Neurological): Unimpaired  Palpation: No palpable anomalies  Palpation: No palpable  anomalies   Assessment  Primary Diagnosis & Pertinent Problem List: There were no encounter diagnoses.  Status Diagnosis  Controlled Controlled Controlled No diagnosis found.  Problems updated and reviewed during this visit: No problems updated. Plan of Care  Pharmacotherapy (Medications Ordered): No orders of the defined types were placed in this encounter.  Lab-work, procedure(s), and/or referral(s): No orders of the defined types were placed in this encounter.   Pharmacological management options:  Opioid Analgesics: We'll take over management today. See above orders Membrane stabilizer: We have discussed the possibility of optimizing this mode of therapy, if tolerated Muscle relaxant: We have discussed the possibility of a trial NSAID: We have discussed the possibility of a trial Other analgesic(s): To be determined at a later time   Interventional management options: Planned, scheduled, and/or pending:    ***   Considering:   ***   PRN Procedures:   To be determined at a later time   Provider-requested follow-up: No follow-ups on file.  No future appointments.  Primary Care Physician: Idelle Crouch, MD Location: [facility] Note by: Gillis Santa, M.D Date: 11/15/2017; Time: 9:25 AM  There are no Patient Instructions on file for this visit.

## 2017-11-15 NOTE — Progress Notes (Signed)
Patient's Name: Teresa Rhodes  MRN: [id number]  Referring Provider: Idelle Crouch, MD  DOB: May 03, 1971  PCP: Idelle Crouch, MD  DOS: 11/15/2017  Note by: Gillis Santa, MD  Service setting: Ambulatory outpatient  Specialty: Interventional Pain Management  Location: [facility] (AMB) Pain Management Facility    Patient type: Established   Primary Reason(s) for Visit: Encounter for prescription drug management. (Level of risk: moderate)  CC: Neck Pain (lower and shoulder)  HPI  Teresa Rhodes is a 47 y.o. year old, female patient, who comes today for a medication management evaluation. She has White matter abnormality on MRI of brain; Foot drop; Right arm weakness; Lumbosacral radiculopathy; Back pain; Degeneration of intervertebral disc of lumbosacral region; Headache, migraine; Current tobacco use; Nausea & vomiting; Chronic idiopathic constipation; Deep vein thrombosis (DVT) of left lower extremity ([place]); Difficulty sleeping; Headache disorder; Leg swelling; Pinched nerve; Right leg pain; and Seizure ([place]) on their problem list. Her primarily concern today is the Neck Pain (lower and shoulder)  Pain Assessment: Location: Lower, Right Neck(shoulder, back, right leg and foot) Radiating: radiates down right leg to foot Onset: More than a month ago Duration: Chronic pain Quality: Aching, Burning, Sharp, Stabbing, Shooting, Tingling(morning the pain is burning and aching, as the day goes on the pain changes to stabbing , sharp, shooting pain) Severity: 3 /10 (self-reported pain score)  Note: Reported level is compatible with observation.                         When using our objective Pain Scale, levels between 6 and 10/10 are said to belong in an emergency room, as it progressively worsens from a 6/10, described as severely limiting, requiring emergency care not usually available at an outpatient pain management facility. At a 6/10 level, communication becomes difficult and requires great effort.  Assistance to reach the emergency department may be required. Facial flushing and profuse sweating along with potentially dangerous increases in heart rate and blood pressure will be evident. Effect on ADL: had to push through daily activities Timing: Constant Modifying factors: heating pad, moist heat wrap, meds  Teresa Rhodes was last scheduled for an appointment on 09/21/2017 for medication management. During today's appointment we reviewed Teresa Rhodes's chronic pain status, as well as her outpatient medication regimen.  Patient returns today for medication management.  She has had her CT lumbar spine and CT cervical spine performed with results below.  Patient is utilizing hydrocodone 5 mg twice daily but feels that she sometimes needs a third dose.  She also noticed her right foot turning blue last night which she took a picture of.  She was assessed by neurosurgery who did not deem her a surgical candidate.  They recommended spinal cord stimulator trial but the patient is not interested.  She has another appointment with a spine specialist in [place] this upcoming week.  Patient is also endorsing constipation.  She has tried stool softeners, laxative, mag citrate none of which have been effective.  The patient  reports that she does not use drugs. Her body mass index is 34.97 kg/m.  Further details on both, my assessment(s), as well as the proposed treatment plan, please see below.  Controlled Substance Pharmacotherapy Assessment REMS (Risk Evaluation and Mitigation Strategy)  Analgesic: Hydrocodone 5 mill grams twice daily as needed, quantity 53-monthMME/day: 10 mg/day.  BChauncey Fischer RN  11/15/2017  8:54 AM  Sign at close encounter Nursing Pain Medication Assessment:  Safety  precautions to be maintained throughout the outpatient stay will include: orient to surroundings, keep bed in low position, maintain call bell within reach at all times, provide assistance with transfer out of bed and  ambulation.  Medication Inspection Compliance: Pill count conducted under aseptic conditions, in front of the patient. Neither the pills nor the bottle was removed from the patient's sight at any time. Once count was completed pills were immediately returned to the patient in their original bottle.  Medication: Hydrocodone/APAP Pill/Patch Count: 26 of 60 pills remain Pill/Patch Appearance: Markings consistent with prescribed medication Bottle Appearance: Standard pharmacy container. Clearly labeled. Filled Date: 04 / 09 / 2019 Last Medication intake:  Yesterday   Pharmacokinetics: Liberation and absorption (onset of action): WNL Distribution (time to peak effect): WNL Metabolism and excretion (duration of action): WNL         Pharmacodynamics: Desired effects: Analgesia: Teresa Rhodes reports >50% benefit. Functional ability: Patient reports that medication allows her to accomplish basic ADLs Clinically meaningful improvement in function (CMIF): Sustained CMIF goals met Perceived effectiveness: Described as relatively effective, allowing for increase in activities of daily living (ADL) Undesirable effects: Side-effects or Adverse reactions: None reported Monitoring: [place] PMP: Online review of the past 47-monthperiod conducted. Compliant with practice rules and regulations Last UDS on record: Summary  Date Value Ref Range Status  08/18/2017 FINAL  Final    Comment:    ==================================================================== TOXASSURE COMP DRUG ANALYSIS,UR ==================================================================== Test                             Result       Flag       Units Drug Present and Declared for Prescription Verification   Amphetamine                    530          EXPECTED   ng/mg creat    Amphetamine is available as a schedule II prescription drug.   Desmethyldiazepam              61           EXPECTED   ng/mg creat   Oxazepam                       198           EXPECTED   ng/mg creat   Temazepam                      66           EXPECTED   ng/mg creat    Desmethyldiazepam, oxazepam, and temazepam are expected    metabolites of diazepam. Desmethyldiazepam and oxazepam are also    expected metabolites of other drugs, including chlordiazepoxide,    prazepam, clorazepate, and halazepam. Oxazepam is an expected    metabolite of temazepam. Oxazepam and temazepam are also    available as scheduled prescription medications. Drug Absent but Declared for Prescription Verification   Tizanidine                     Not Detected UNEXPECTED    Tizanidine, as indicated in the declared medication list, is not    always detected even when used as directed. ==================================================================== Test  Result    Flag   Units      Ref Range   Creatinine              158              mg/dL      >=20 ==================================================================== Declared Medications:  The flagging and interpretation on this report are based on the  following declared medications.  Unexpected results may arise from  inaccuracies in the declared medications.  **Note: The testing scope of this panel includes these medications:  Amphetamine (Adderall)  Diazepam  **Note: The testing scope of this panel does not include small to  moderate amounts of these reported medications:  Tizanidine  **Note: The testing scope of this panel does not include following  reported medications:  Bisoprolol (Ziac)  Hydrochlorothiazide (Ziac)  Losartan (Losartan Potassium)  Omeprazole  Ranitidine  Sumatriptan ==================================================================== For clinical consultation, please call [phone]. ====================================================================    UDS interpretation: Compliant          Medication Assessment Form: Reviewed. Patient indicates being compliant with  therapy Treatment compliance: Compliant Risk Assessment Profile: Aberrant behavior: See prior evaluations. None observed or detected today Comorbid factors increasing risk of overdose: See prior notes. No additional risks detected today Risk of substance use disorder (SUD): Low  ORT Scoring interpretation table:  Score <3 = Low Risk for SUD  Score between 4-7 = Moderate Risk for SUD  Score >8 = High Risk for Opioid Abuse   Risk Mitigation Strategies:  Patient Counseling: Covered Patient-Prescriber Agreement (PPA): Present and active  Notification to other healthcare providers: Done  Pharmacologic Plan: Increase hydrocodone to 5 mg 3 times daily as needed, quantity 6-month             Laboratory Chemistry  Inflammation Markers (CRP: Acute Phase) (ESR: Chronic Phase) Lab Results  Component Value Date   LATICACIDVEN 1.21 11/01/2014                         Rheumatology Markers No results found for: RF, ANA, LABURIC, URICUR, LYMEIGGIGMAB, [facility]                     Renal Function Markers Lab Results  Component Value Date   BUN <5 (L) 04/08/2017   CREATININE 0.55 04/08/2017   GFRAA >60 04/08/2017   GFRNONAA >60 04/08/2017                              Hepatic Function Markers Lab Results  Component Value Date   AST 27 04/07/2017   ALT 30 04/07/2017   ALBUMIN 4.1 04/07/2017   ALKPHOS 90 04/07/2017   LIPASE 22 04/07/2017                        Electrolytes Lab Results  Component Value Date   NA 143 04/08/2017   K 3.9 04/08/2017   CL 110 04/08/2017   CALCIUM 8.4 (L) 04/08/2017                        Neuropathy Markers Lab Results  Component Value Date   VUJWJXBJY78 29509/29/2015   HIV Non Reactive 04/08/2017                        Bone Pathology Markers No results found for: VD25OH,  [id number], [id number], GN5621HY8, 25OHVITD1, 25OHVITD2, 25OHVITD3, TESTOFREE, TESTOSTERONE                       Coagulation Parameters Lab Results  Component Value  Date   PLT 198 04/08/2017                        Cardiovascular Markers Lab Results  Component Value Date   TROPONINI < 0.02 11/17/2013   HGB 13.1 04/08/2017   HCT 37.5 04/08/2017                         CA Markers No results found for: CEA, CA125, LABCA2                      Note: Lab results reviewed.  Recent Diagnostic Imaging Results  CT CERVICAL SPINE WO CONTRAST CLINICAL DATA:  47 year old female with spine pain. Pain radiating to the right leg with numbness. Prior cervical and lumbar spine surgery without significant improvement.  PROCEDURE: CT CERVICAL MYELOGRAM  CT LUMBAR MYELOGRAM  Lumbar puncture, intrathecal contrast administration, and plain myelogram imaging were performed by Dr. Kathreen Devoid and are reported separately, along with fluoroscopy time.  COMPARISON:  Brain MRI 09/16/2016. CT Abdomen and Pelvis 04/07/2017. Lumbar MRI 12/14/2016, and earlier.  TECHNIQUE: Contiguous axial images were obtained through the Cervical and Lumbar spine after the intrathecal infusion of infusion. Coronal and sagittal reconstructions were obtained of the axial image sets.  FINDINGS: CT CERVICAL MYELOGRAM FINDINGS:  Expected subarachnoid contrast in the cisterna magna and visible basilar cisterns.  Grossly normal visible posterior fossa structures.  Mild thyromegaly. No discrete thyroid nodule is evident. Otherwise negative noncontrast paraspinal soft tissues. No cervical lymphadenopathy. Negative noncontrast thoracic inlet. Negative lung apices.  Straightening of cervical lordosis. No acute osseous abnormality identified. Postoperative details are below.  Good intrathecal contrast opacification. Normal myelographic appearance of the cervical and upper thoracic spinal cord.  C2-C3:  Minimal right foraminal endplate spurring.  No stenosis.  C3-C4: Minimal circumferential disc bulge and endplate spurring. Borderline to mild facet hypertrophy. No  stenosis.  C4-C5: Subtle anterolisthesis. Disc space loss but mild circumferential disc bulge. Endplate spurring involving both foramina. Moderate left and mild right facet hypertrophy. Mild left ligament flavum hypertrophy. No significant spinal stenosis. Moderate to severe left C5 foraminal stenosis. Mild right foraminal stenosis.  C5-C6: Previous ACDF with densely radiopaque interbody implant. No hardware loosening. Solid posterior element arthrodesis. No stenosis is evident.  C6-C7: Previous ACDF with dense radiopaque interbody implant. No hardware loosening. Solid posterior element arthrodesis. No stenosis is evident.  C7-T1: Mild to moderate facet hypertrophy greater on the right. Minor endplate spurring. No spinal stenosis. No convincing foraminal stenosis.  T1-T2: Mild facet hypertrophy. Mild endplate spurring. No convincing foraminal stenosis. No spinal stenosis.  T2-T3: Mild to moderate facet hypertrophy greater on the right. No stenosis.  CT LUMBAR MYELOGRAM FINDINGS:  For the purposes of this report the same numbering system will be used as on the lumbar MRI in 2018. 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 The previous fused level is L4-L5.  No acute osseous abnormality identified. Stable vertebral height and alignment since 2018. Visible sacrum and SI joints are intact. Postoperative details are below.  Negative visible noncontrast abdominal and pelvic viscera. Postoperative changes to the posterior paraspinal soft tissues at L4 and L5. Small volume of extra thecal contrast along to the right of midline  at the L4-L5 level is along the lumbar puncture trajectory and incidental.  Good intrathecal contrast opacification. Normal myelographic appearance of the lower thoracic spinal cord, and the conus at L1. Trace intrathecal gas related to injection.  T11-T12: Mild to moderate facet hypertrophy.  No stenosis.  T12-L1:  Mild facet and ligament flavum hypertrophy.   No stenosis.  L1-L2:  Mild facet hypertrophy.  No stenosis.  L2-L3: Circumferential but mostly far lateral disc bulging greater on the right. Mild facet hypertrophy, greater on the right. Right far lateral endplate spurring. No spinal or lateral recess stenosis. No foraminal stenosis.  L3-L4: Disc space loss and circumferential disc bulge with mild endplate spurring. Broad-based posterior and foraminal involvement. Moderate facet and ligament flavum hypertrophy. No spinal or convincing lateral recess stenosis. Mild left and borderline to mild right L3 neural foraminal stenosis.  L4-L5: Previous posterior and interbody fusion with right laminectomy. Bilateral transpedicular hardware is in place with no loosening. Interbody implant is in place and appears satisfactory. There is interbody calcification but no convincing interbody arthrodesis. However, there does may be developing arthrodesis through the left posterior elements (sagittal image 42). No spinal stenosis. No lateral recess stenosis is evident. No significant foraminal stenosis.  L5-S1: Mild disc space loss with circumferential disc bulge and endplate spurring. Severe facet hypertrophy greater on the left. Bilateral side vacuum facet. Left facet subchondral sclerosis, and bulky facet overgrowth. Mild epidural lipomatosis. No spinal stenosis. There is mild to moderate left and mild right L5 neural foraminal stenosis primarily due to bony overgrowth. No convincing lateral recess stenosis.  IMPRESSION: Postmyelogram CTs of the cervical and lumbar spine:  CERVICAL SPINE:  1. Prior cervical ACDF at C5-C6 and C6-C7 with solid arthrodesis and no hardware loosening. 2. Mild overall cervical spine adjacent segment disease, more pronounced at C4-C5 where subtle anterolisthesis is associated with moderate facet arthropathy greater on the left. No spinal stenosis, but moderate to severe left C5 neural foraminal stenosis. 3. No  cervical spinal stenosis and no other foraminal stenosis.  LUMBAR SPINE:  1. Transitional lumbosacral anatomy. 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 2. Posterior and interbody fusion sequelae at L4-L5 with no hardware loosening but no convincing arthrodesis. Questionable developing posterior element arthrodesis on the left. 3. Mild adjacent segment disease at L3-L4 with only mild L3 neural foraminal stenosis. 4. Adjacent segment disease at L5-S1 with severe facet arthropathy greater on the left. Bilateral vacuum facet. No convincing spinal or lateral recess stenosis. Up to moderate left and mild right L5 neural foraminal stenosis.  Electronically Signed   By: Genevie Ann M.D.   On: 10/07/2017 12:30 CT LUMBAR SPINE WO CONTRAST CLINICAL DATA:  47 year old female with spine pain. Pain radiating to the right leg with numbness. Prior cervical and lumbar spine surgery without significant improvement.  PROCEDURE: CT CERVICAL MYELOGRAM  CT LUMBAR MYELOGRAM  Lumbar puncture, intrathecal contrast administration, and plain myelogram imaging were performed by Dr. Kathreen Devoid and are reported separately, along with fluoroscopy time.  COMPARISON:  Brain MRI 09/16/2016. CT Abdomen and Pelvis 04/07/2017. Lumbar MRI 12/14/2016, and earlier.  TECHNIQUE: Contiguous axial images were obtained through the Cervical and Lumbar spine after the intrathecal infusion of infusion. Coronal and sagittal reconstructions were obtained of the axial image sets.  FINDINGS: CT CERVICAL MYELOGRAM FINDINGS:  Expected subarachnoid contrast in the cisterna magna and visible basilar cisterns.  Grossly normal visible posterior fossa structures.  Mild  thyromegaly. No discrete thyroid nodule is evident. Otherwise negative noncontrast paraspinal soft tissues. No cervical lymphadenopathy. Negative noncontrast thoracic inlet. Negative  lung apices.  Straightening of cervical lordosis. No acute osseous abnormality identified. Postoperative details are below.  Good intrathecal contrast opacification. Normal myelographic appearance of the cervical and upper thoracic spinal cord.  C2-C3:  Minimal right foraminal endplate spurring.  No stenosis.  C3-C4: Minimal circumferential disc bulge and endplate spurring. Borderline to mild facet hypertrophy. No stenosis.  C4-C5: Subtle anterolisthesis. Disc space loss but mild circumferential disc bulge. Endplate spurring involving both foramina. Moderate left and mild right facet hypertrophy. Mild left ligament flavum hypertrophy. No significant spinal stenosis. Moderate to severe left C5 foraminal stenosis. Mild right foraminal stenosis.  C5-C6: Previous ACDF with densely radiopaque interbody implant. No hardware loosening. Solid posterior element arthrodesis. No stenosis is evident.  C6-C7: Previous ACDF with dense radiopaque interbody implant. No hardware loosening. Solid posterior element arthrodesis. No stenosis is evident.  C7-T1: Mild to moderate facet hypertrophy greater on the right. Minor endplate spurring. No spinal stenosis. No convincing foraminal stenosis.  T1-T2: Mild facet hypertrophy. Mild endplate spurring. No convincing foraminal stenosis. No spinal stenosis.  T2-T3: Mild to moderate facet hypertrophy greater on the right. No stenosis.  CT LUMBAR MYELOGRAM FINDINGS:  For the purposes of this report the same numbering system will be used as on the lumbar MRI in 2018. 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 The previous fused level is L4-L5.  No acute osseous abnormality identified. Stable vertebral height and alignment since 2018. Visible sacrum and SI joints are intact. Postoperative details are below.  Negative visible noncontrast abdominal and pelvic viscera. Postoperative changes to the posterior paraspinal soft tissues at L4 and L5. Small  volume of extra thecal contrast along to the right of midline at the L4-L5 level is along the lumbar puncture trajectory and incidental.  Good intrathecal contrast opacification. Normal myelographic appearance of the lower thoracic spinal cord, and the conus at L1. Trace intrathecal gas related to injection.  T11-T12: Mild to moderate facet hypertrophy.  No stenosis.  T12-L1:  Mild facet and ligament flavum hypertrophy.  No stenosis.  L1-L2:  Mild facet hypertrophy.  No stenosis.  L2-L3: Circumferential but mostly far lateral disc bulging greater on the right. Mild facet hypertrophy, greater on the right. Right far lateral endplate spurring. No spinal or lateral recess stenosis. No foraminal stenosis.  L3-L4: Disc space loss and circumferential disc bulge with mild endplate spurring. Broad-based posterior and foraminal involvement. Moderate facet and ligament flavum hypertrophy. No spinal or convincing lateral recess stenosis. Mild left and borderline to mild right L3 neural foraminal stenosis.  L4-L5: Previous posterior and interbody fusion with right laminectomy. Bilateral transpedicular hardware is in place with no loosening. Interbody implant is in place and appears satisfactory. There is interbody calcification but no convincing interbody arthrodesis. However, there does may be developing arthrodesis through the left posterior elements (sagittal image 42). No spinal stenosis. No lateral recess stenosis is evident. No significant foraminal stenosis.  L5-S1: Mild disc space loss with circumferential disc bulge and endplate spurring. Severe facet hypertrophy greater on the left. Bilateral side vacuum facet. Left facet subchondral sclerosis, and bulky facet overgrowth. Mild epidural lipomatosis. No spinal stenosis. There is mild to moderate left and mild right L5 neural foraminal stenosis primarily due to bony overgrowth. No convincing lateral recess  stenosis.  IMPRESSION: Postmyelogram CTs of the cervical and lumbar spine:  CERVICAL SPINE:  1. Prior cervical ACDF at C5-C6  and C6-C7 with solid arthrodesis and no hardware loosening. 2. Mild overall cervical spine adjacent segment disease, more pronounced at C4-C5 where subtle anterolisthesis is associated with moderate facet arthropathy greater on the left. No spinal stenosis, but moderate to severe left C5 neural foraminal stenosis. 3. No cervical spinal stenosis and no other foraminal stenosis.  LUMBAR SPINE:  1. Transitional lumbosacral anatomy. 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 2. Posterior and interbody fusion sequelae at L4-L5 with no hardware loosening but no convincing arthrodesis. Questionable developing posterior element arthrodesis on the left. 3. Mild adjacent segment disease at L3-L4 with only mild L3 neural foraminal stenosis. 4. Adjacent segment disease at L5-S1 with severe facet arthropathy greater on the left. Bilateral vacuum facet. No convincing spinal or lateral recess stenosis. Up to moderate left and mild right L5 neural foraminal stenosis.  Electronically Signed   By: Genevie Ann M.D.   On: 10/07/2017 12:30 DG Myelogram Lumbar CLINICAL DATA:  Cervical and lumbar radiculopathy.  FLUOROSCOPY TIME:  30.2 mGy  PROCEDURE: LUMBAR PUNCTURE FOR CERVICAL AND LUMBAR MYELOGRAM  CERVICAL AND LUMBAR MYELOGRAM  After thorough discussion of risks and benefits of the procedure including bleeding, infection, injury to nerves, blood vessels, adjacent structures as well as headache and CSF leak, written and oral informed consent was obtained. Consent was obtained by Dr. Kathreen Devoid.  Patient was positioned prone on the fluoroscopy table. Local anesthesia was provided with 1% lidocaine without epinephrine after prepped and draped in the usual sterile fashion. Puncture was performed  at L4-5 using a 5 inch 22-gauge spinal needle via midline approach. Using a single pass through the dura, the needle was placed within the thecal sac, with return of clear CSF. 10 mL of Isovue-300 M was injected into the thecal sac, with normal opacification of the nerve roots and cauda equina consistent with free flow within the subarachnoid space. The patient was then moved to the trendelenburg position and contrast flowed into the Cervical spine region.  I personally performed the lumbar puncture and administered the intrathecal contrast. I also personally supervised acquisition of the myelogram images.  FINDINGS: Intrathecal contrast is identified in the lumbar spine and cervical spine. Patient is transported to CT for a CT of cervical spine and lumbar spine dictated separately.  IMPRESSION: Successful cervical and lumbar myelogram prior to CT scan of the cervical spine and lumbar spine.  Electronically Signed   By: Kathreen Devoid   On: 10/07/2017 09:25 DG Myelogram Cervical CLINICAL DATA:  Cervical and lumbar radiculopathy.  FLUOROSCOPY TIME:  30.2 mGy  PROCEDURE: LUMBAR PUNCTURE FOR CERVICAL AND LUMBAR MYELOGRAM  CERVICAL AND LUMBAR MYELOGRAM  After thorough discussion of risks and benefits of the procedure including bleeding, infection, injury to nerves, blood vessels, adjacent structures as well as headache and CSF leak, written and oral informed consent was obtained. Consent was obtained by Dr. Kathreen Devoid.  Patient was positioned prone on the fluoroscopy table. Local anesthesia was provided with 1% lidocaine without epinephrine after prepped and draped in the usual sterile fashion. Puncture was performed at L4-5 using a 5 inch 22-gauge spinal needle via midline approach. Using a single pass through the dura, the needle was placed within the thecal sac, with return of clear CSF. 10 mL of Isovue-300 M was injected into the thecal sac, with normal opacification of  the nerve roots and cauda equina consistent with free flow within the subarachnoid space.  The patient was then moved to the trendelenburg position and contrast flowed into the Cervical spine region.  I personally performed the lumbar puncture and administered the intrathecal contrast. I also personally supervised acquisition of the myelogram images.  FINDINGS: Intrathecal contrast is identified in the lumbar spine and cervical spine. Patient is transported to CT for a CT of cervical spine and lumbar spine dictated separately.  IMPRESSION: Successful cervical and lumbar myelogram prior to CT scan of the cervical spine and lumbar spine.  Electronically Signed   By: Kathreen Devoid   On: 10/07/2017 09:25  Complexity Note: Imaging results reviewed. Results shared with Teresa Rhodes, using Layman's terms.                         Meds   Current Outpatient Medications:  .  amphetamine-dextroamphetamine (ADDERALL) 10 MG tablet, Take 10 mg by mouth 2 (two) times daily with a meal., Disp: , Rfl:  .  azelastine (ASTELIN) 0.1 % nasal spray, Place 1 spray into both nostrils daily., Disp: , Rfl:  .  diazepam (VALIUM) 5 MG tablet, Take 5 mg by mouth every 12 (twelve) hours as needed for anxiety. , Disp: , Rfl:  .  HYDROcodone-acetaminophen (NORCO/VICODIN) 5-325 MG tablet, Take 1 tablet by mouth 3 (three) times daily as needed for moderate pain or severe pain. For chronic pain To last for 30 days from fill date To fill on or after: 11/23/17, 12/23/17, Disp: 90 tablet, Rfl: 0 .  losartan-hydrochlorothiazide (HYZAAR) 100-25 MG tablet, Take 1 tablet by mouth daily., Disp: , Rfl:  .  omeprazole (PRILOSEC) 40 MG capsule, Take 40 mg by mouth daily., Disp: , Rfl:  .  ranitidine (ZANTAC) 300 MG tablet, Take by mouth., Disp: , Rfl:  .  SUMAtriptan (IMITREX) 100 MG tablet, Take 1/2-1 tab at headache onset, may repeat once if needed in 2 hours.  No more than 2 in 24 hours., Disp: , Rfl:  .  tiZANidine (ZANAFLEX) 4 MG  tablet, Take 1 tablet (4 mg total) by mouth every 8 (eight) hours as needed for muscle spasms., Disp: 90 tablet, Rfl: 1 .  bisoprolol-hydrochlorothiazide (ZIAC) 5-6.25 MG tablet, Take 1 tablet by mouth daily., Disp: , Rfl: 11 .  naloxegol oxalate (MOVANTIK) 25 MG TABS tablet, Take 1 tablet (25 mg total) by mouth daily. Take on an empty stomach at least 1 hour before or 2 hours after a meal., Disp: 30 tablet, Rfl: 0 .  omeprazole (PRILOSEC) 40 MG capsule, Take 1 capsule (40 mg total) by mouth daily. Then take once a day as before, Disp: 30 capsule, Rfl: 0  ROS  Constitutional: Denies any fever or chills Gastrointestinal: No reported hemesis, hematochezia, vomiting, or acute GI distress Musculoskeletal: Denies any acute onset joint swelling, redness, loss of ROM, or weakness Neurological: No reported episodes of acute onset apraxia, aphasia, dysarthria, agnosia, amnesia, paralysis, loss of coordination, or loss of consciousness  Allergies  Teresa Rhodes is allergic to robaxin [methocarbamol].  PFSH  Drug: Teresa Rhodes  reports that she does not use drugs. Alcohol:  reports that she does not drink alcohol. Tobacco:  reports that she quit smoking about 3 years ago. Her smoking use included cigarettes. She has a 1.50 pack-year smoking history. She has never used smokeless tobacco. Medical:  has a past medical history of Complication of anesthesia, Depression, Gallstones, Hypertension, Migraine, and Seizures ([place]) (2010-2013). Surgical: Teresa Rhodes  has a past surgical history that includes Cholecystectomy (2002); Neck  surgery (2005 & 2009); Back surgery; Esophagogastroduodenoscopy (egd) with propofol (N/A, 04/10/2017); and Colonoscopy with propofol (N/A, 04/10/2017). Family: family history includes Kidney disease (age of onset: 70) in her brother; Kidney disease (age of onset: 45) in her father; Liver disease in her maternal grandfather; Ovarian cancer in her mother; Ulcerative colitis in her  mother.  Constitutional Exam  General appearance: Well nourished, well developed, and well hydrated. In no apparent acute distress Vitals:   11/15/17 0852  BP: 116/66  Pulse: 93  Temp: 98.2 F (36.8 C)  SpO2: 100%  Weight: 230 lb (104.3 kg)  Height: _0  (1.727 m)   BMI Assessment: Estimated body mass index is 34.97 kg/m as calculated from the following:   Height as of this encounter: _1  (1.727 m).   Weight as of this encounter: 230 lb (104.3 kg).  BMI interpretation table: BMI level Category Range association with higher incidence of chronic pain  <18 kg/m2 Underweight   18.5-24.9 kg/m2 Ideal body weight   25-29.9 kg/m2 Overweight Increased incidence by 20%  30-34.9 kg/m2 Obese (Class I) Increased incidence by 68%  35-39.9 kg/m2 Severe obesity (Class II) Increased incidence by 136%  >40 kg/m2 Extreme obesity (Class III) Increased incidence by 254%   Patient's current BMI Ideal Body weight  Body mass index is 34.97 kg/m. Ideal body weight: 63.9 kg (140 lb 14 oz) Adjusted ideal body weight: 80.1 kg (176 lb 8.4 oz)   BMI Readings from Last 4 Encounters:  11/15/17 34.97 kg/m  10/07/17 35.15 kg/m  09/21/17 35.15 kg/m  08/18/17 36.92 kg/m   Wt Readings from Last 4 Encounters:  11/15/17 230 lb (104.3 kg)  10/07/17 238 lb (108 kg)  09/21/17 238 lb (108 kg)  08/18/17 250 lb (113.4 kg)  Psych/Mental status: Alert, oriented x 3 (person, place, & time)       Eyes: PERLA Respiratory: No evidence of acute respiratory distress Cervical Spine Area Exam  Skin & Axial Inspection:Well healed scar from previous spine surgery detected Alignment:Symmetrical Functional OMB:TDHRCBULA ROM, bilaterally Stability:No instability detected Muscle Tone/Strength:Functionally intact. No obvious neuro-muscular anomalies detected. Sensory (Neurological):Dermatomal pain pattern Palpation:Complains of area being tender to palpationPositive provocative maneuver forfor cervical facet  disease         Upper Extremity (UE) Exam    Side:Right upper extremity  Side:Left upper extremity   Skin & Extremity Inspection:Skin color, temperature, and hair growth are WNL. No peripheral edema or cyanosis. No masses, redness, swelling, asymmetry, or associated skin lesions. No contractures.  Skin & Extremity Inspection:Skin color, temperature, and hair growth are WNL. No peripheral edema or cyanosis. No masses, redness, swelling, asymmetry, or associated skin lesions. No contractures.   Functional GTX:[id number] ROM  Functional GNO:IBBCWUGQBVQX ROM   Muscle Tone/Strength:Functionally intact. No obvious neuro-muscular anomalies detected.  Muscle Tone/Strength:Functionally intact. No obvious neuro-muscular anomalies detected.   Sensory (Neurological):Unimpaired  Sensory (Neurological):Unimpaired   Palpation:No palpable anomalies  Palpation:No palpable anomalies   Specialized Test(s):Deferred  Specialized Test(s):Deferred   4out of 5 strength RIGHTupper extremity: Shoulder abduction, elbow flexion, elbow extension, thumb extension.  Thoracic Spine Area Exam  Skin & Axial Inspection:No masses, redness, or swelling Alignment:Symmetrical Functional IHW:[id number] ROM Stability:No instability detected Muscle Tone/Strength:Functionally intact. No obvious neuro-muscular anomalies detected. Sensory (Neurological):Unimpaired Muscle strength & Tone:No palpable anomalies  Lumbar Spine Area Exam  Skin & Axial Inspection:Well healed scar from previous spine surgery detected Alignment:Symmetrical Functional PHX:TAVWPVXYI ROM, bilaterally Stability:No instability detected Muscle Tone/Strength:Functionally intact. No obvious neuro-muscular anomalies detected. Sensory (Neurological):dermatomal pain pattern Palpation:+TTP overlying  bilateral lumbar facets Provocative  Tests: Lumbar Hyperextension and rotation test:Positivebilaterally for facet joint pain. Lumbar Lateral bending test:Positivedue to fusion restriction. Patrick's Maneuver:Positivefor right-sided S-I arthralgia Right lower extremity foot drop, L4, L5 nerve root weakness. Patient unable to dorsiflex her ankle or her great toe suggesting right L4 and right L5 pathology  Gait & Posture Assessment  Ambulation:Limited Gait:Antalgic Posture:WNL  Lower Extremity Exam    Side:Right lower extremity  Side:Left lower extremity  Skin & Extremity Inspection:, Positive for color change, blue along foot, patient's right ankle with brace.  Skin & Extremity Inspection:Skin color, temperature, and hair growth are WNL. No peripheral edema or cyanosis. No masses, redness, swelling, asymmetry, or associated skin lesions. No contractures.  Functional UDJ:SHFWYOVZC range of motion with plantarflexion, dorsiflexion  Functional HYI:FOYDXAJOINOM ROM  Muscle Tone/Strength:Functionally intact. No obvious neuro-muscular anomalies detected.  Muscle Tone/Strength:Functionally intact. No obvious neuro-muscular anomalies detected.  Sensory (Neurological):Severe neuropathy  Sensory (Neurological):Unimpaired  Palpation:No palpable anomalies  Palpation:No palpable anomalies     Assessment  Primary Diagnosis & Pertinent Problem List: The primary encounter diagnosis was Therapeutic opioid-induced constipation (OIC). Diagnoses of History of fusion of lumbar spine, Failed back surgical syndrome, Lumbar radiculopathy, Cervical fusion syndrome, Cervical radiculopathy, Cervical radiculopathy due to degenerative joint disease of spine, and Chronic pain syndrome were also pertinent to this visit.  Status Diagnosis  Persistent Persistent Persistent 1. Therapeutic opioid-induced constipation (OIC)   2. History of fusion of lumbar spine   3. Failed back surgical syndrome   4.  Lumbar radiculopathy   5. Cervical fusion syndrome   6. Cervical radiculopathy   7. Cervical radiculopathy due to degenerative joint disease of spine   8. Chronic pain syndrome      General Recommendations: The pain condition that the patient suffers from is best treated with a multidisciplinary approach that involves an increase in physical activity to prevent de-conditioning and worsening of the pain cycle, as well as psychological counseling (formal and/or informal) to address the co-morbid psychological affects of pain. Treatment will often involve judicious use of pain medications and interventional procedures to decrease the pain, allowing the patient to participate in the physical activity that will ultimately produce long-lasting pain reductions. The goal of the multidisciplinary approach is to return the patient to a higher level of overall function and to restore their ability to perform activities of daily living.  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 Patient symptoms are secondary to cervical radiculopathy, cervical fusion syndrome, failed back surgical syndrome, chronic lumbar radiculopathy. Patient has tried various medication including Lyrica, Effexor, Cymbalta, gabapentin, nortriptyline, Flexeril, Robaxin. These medications had limited benefit or not effective in achieving effective pain control for the patient. She has had a series of 9 epidural steroid injections. She states that these were only helpful for approximately 3-4 weeks. She has been talked to about spinal cord stimulation but she states that we will not make her right foot drop any better. We did have a discussion about spinal cord stimulation and I believe that  this therapy could be potentially beneficial and helping to reduce the patient's right lower extremity pain but not necessarily her foot drop symptoms. Patient wants to hold off at this time.  Patient follows up after completing her CT cervical and  lumbar spine.  Cervical spine confirms ACDF at C5-C6 and C6-C7 with solid arthrodesis, C4-C5 subtle retrolisthesis with moderate facet arthropathy, left greater than right.  Regards to her lumbar spine, patient has transitional lumbosacral anatomy with a history of a posterior interbody fusion at L4-L5 with no hardware loosening and questionable developing posterior element arthrodesis on the left.  She does have mild adjacent segment disease at L3-4 with only mild L3 neuroforaminal stenosis.  She does have adjacent segment disease at L5-S1 with severe facet arthropathy great on the left.  This results in a bilateral vacuum effect.er  In regards to therapeutic management, we discussed increasing her hydrocodone to 5 mg twice daily to 3 times daily as needed.  Patient states that this would allow her to function better because sometimes she feels that she needs a third dose in the evening.  Patient is also endorsing constipation that is been refractory to stool softeners, laxatives, mag citrate.  We discussed Movantik 22m daily for opiate-induced constipation.  Also in regards to interventional therapies, I discussed  caudal ESI for her chronic lumbosacral radiculopathy along bilateral L5-S1 facet medial branch nerve block for L5-S1 severe facet arthropathy however patient wants to hold off. Patient does have an appointment with a spine specialist this Friday.  I encouraged her to have them review her most recent CT lumbar spine and further evaluate possible posterior element arthrodesis at left L4-L5.  Plan: -Increase hydrocodone from 5 mg twice daily to 3 times daily as needed.  Prescription provided for 2 months. -Movantik 25 mg daily for opiate-induced  constipation -Continue tizanidine 4 mg 3 times daily as needed -Continue Imitrex 100 mg as needed migraines.   Plan of Care  Pharmacotherapy (Medications Ordered along with): Meds ordered this encounter  Medications  . DISCONTD: HYDROcodone-acetaminophen (NORCO/VICODIN) 5-325 MG tablet    Sig: Take 1 tablet by mouth 3 (three) times daily as needed for moderate pain or severe pain. For chronic pain To last for 30 days from fill date To fill on or after: 11/23/17, 12/23/17    Dispense:  90 tablet    Refill:  0    Do not place this medication, or any other prescription from our practice, on "Automatic Refill". Patient may have prescription filled one day early if pharmacy is closed on scheduled refill date.  . naloxegol oxalate (MOVANTIK) 25 MG TABS tablet    Sig: Take 1 tablet (25 mg total) by mouth daily. Take on an empty stomach at least 1 hour before or 2 hours after a meal.    Dispense:  30 tablet    Refill:  0    Please instruct the patient not to break the tablet.  .Marland KitchenHYDROcodone-acetaminophen (NORCO/VICODIN) 5-325 MG tablet    Sig: Take 1 tablet by mouth 3 (three) times daily as needed for moderate pain or severe pain. For chronic pain To last for 30 days from fill date To fill on or after: 11/23/17, 12/23/17    Dispense:  90 tablet    Refill:  0    Do not place this medication, or any other prescription from our practice, on "Automatic Refill". Patient may have prescription filled one day early if pharmacy is closed on scheduled refill date.   Time Note: Greater than 50% of the 25 minute(s) of face-to-face time spent with Teresa Rhodes was spent in counseling/coordination of care regarding: Ms. PGassettprimary cause of pain, the results of her recent test(s), the significance of each one oth the test(s) anomalies  and it's corresponding characteristic pain pattern(s), the treatment plan, treatment alternatives, the risks and possible complications of proposed treatment, medication side effects,  the opioid analgesic risks and possible complications, the appropriate use of her medications, realistic expectations, the goals of pain management (increased in functionality), the medication agreement and the patient's responsibilities when it comes to controlled substances. Provider-requested follow-up: Return in about 6 weeks (around 12/27/2017) for Medication Management.  No future appointments.  Primary Care Physician: Idelle Crouch, MD Location: [facility] Note by: Gillis Santa, M.D Date: 11/15/2017; Time: 9:38 AM  Patient Instructions  You have been given one prescription for Hydrocodone today. Once prescription sent to pharmacy.

## 2017-11-15 NOTE — Progress Notes (Signed)
Nursing Pain Medication Assessment:  Safety precautions to be maintained throughout the outpatient stay will include: orient to surroundings, keep bed in low position, maintain call bell within reach at all times, provide assistance with transfer out of bed and ambulation.  Medication Inspection Compliance: Pill count conducted under aseptic conditions, in front of the patient. Neither the pills nor the bottle was removed from the patient's sight at any time. Once count was completed pills were immediately returned to the patient in their original bottle.  Medication: Hydrocodone/APAP Pill/Patch Count: 26 of 60 pills remain Pill/Patch Appearance: Markings consistent with prescribed medication Bottle Appearance: Standard pharmacy container. Clearly labeled. Filled Date: 04 / 09 / 2019 Last Medication intake:  Yesterday

## 2017-11-15 NOTE — Patient Instructions (Addendum)
You have been given one prescription for Hydrocodone today. Once prescription sent to pharmacy.

## 2017-11-18 DIAGNOSIS — M549 Dorsalgia, unspecified: Secondary | ICD-10-CM | POA: Diagnosis not present

## 2017-11-18 DIAGNOSIS — M961 Postlaminectomy syndrome, not elsewhere classified: Secondary | ICD-10-CM | POA: Diagnosis not present

## 2017-11-18 DIAGNOSIS — G894 Chronic pain syndrome: Secondary | ICD-10-CM | POA: Diagnosis not present

## 2017-11-18 DIAGNOSIS — M4726 Other spondylosis with radiculopathy, lumbar region: Secondary | ICD-10-CM | POA: Diagnosis not present

## 2017-12-02 DIAGNOSIS — M5137 Other intervertebral disc degeneration, lumbosacral region: Secondary | ICD-10-CM | POA: Diagnosis not present

## 2017-12-02 DIAGNOSIS — R51 Headache: Secondary | ICD-10-CM | POA: Diagnosis not present

## 2017-12-02 DIAGNOSIS — I10 Essential (primary) hypertension: Secondary | ICD-10-CM | POA: Diagnosis not present

## 2017-12-27 ENCOUNTER — Encounter: Payer: Self-pay | Admitting: Student in an Organized Health Care Education/Training Program

## 2017-12-27 ENCOUNTER — Other Ambulatory Visit: Payer: Self-pay

## 2017-12-27 ENCOUNTER — Ambulatory Visit
Payer: 59 | Attending: Student in an Organized Health Care Education/Training Program | Admitting: Student in an Organized Health Care Education/Training Program

## 2017-12-27 VITALS — BP 129/79 | HR 87 | Temp 97.9°F | Resp 16 | Ht 69.0 in | Wt 225.0 lb

## 2017-12-27 DIAGNOSIS — Z9889 Other specified postprocedural states: Secondary | ICD-10-CM | POA: Diagnosis not present

## 2017-12-27 DIAGNOSIS — Z888 Allergy status to other drugs, medicaments and biological substances status: Secondary | ICD-10-CM | POA: Insufficient documentation

## 2017-12-27 DIAGNOSIS — M961 Postlaminectomy syndrome, not elsewhere classified: Secondary | ICD-10-CM | POA: Diagnosis not present

## 2017-12-27 DIAGNOSIS — Z79899 Other long term (current) drug therapy: Secondary | ICD-10-CM | POA: Insufficient documentation

## 2017-12-27 DIAGNOSIS — M5127 Other intervertebral disc displacement, lumbosacral region: Secondary | ICD-10-CM | POA: Diagnosis not present

## 2017-12-27 DIAGNOSIS — Q761 Klippel-Feil syndrome: Secondary | ICD-10-CM | POA: Insufficient documentation

## 2017-12-27 DIAGNOSIS — M4722 Other spondylosis with radiculopathy, cervical region: Secondary | ICD-10-CM | POA: Insufficient documentation

## 2017-12-27 DIAGNOSIS — M5412 Radiculopathy, cervical region: Secondary | ICD-10-CM

## 2017-12-27 DIAGNOSIS — Z981 Arthrodesis status: Secondary | ICD-10-CM | POA: Insufficient documentation

## 2017-12-27 DIAGNOSIS — G894 Chronic pain syndrome: Secondary | ICD-10-CM

## 2017-12-27 DIAGNOSIS — Z87891 Personal history of nicotine dependence: Secondary | ICD-10-CM | POA: Diagnosis not present

## 2017-12-27 DIAGNOSIS — M5416 Radiculopathy, lumbar region: Secondary | ICD-10-CM

## 2017-12-27 MED ORDER — HYDROCODONE-ACETAMINOPHEN 5-325 MG PO TABS: 1.0000 | ORAL_TABLET | Freq: Three times a day (TID) | ORAL | 0 refills | Status: DC | PRN

## 2017-12-27 NOTE — Progress Notes (Signed)
Safety precautions to be maintained throughout the outpatient stay will include: orient to surroundings, keep bed in low position, maintain call bell within reach at all times, provide assistance with transfer out of bed and ambulation.   Nursing Pain Medication Assessment:  Safety precautions to be maintained throughout the outpatient stay will include: orient to surroundings, keep bed in low position, maintain call bell within reach at all times, provide assistance with transfer out of bed and ambulation.  Medication Inspection Compliance: Pill count conducted under aseptic conditions, in front of the patient. Neither the pills nor the bottle was removed from the patient's sight at any time. Once count was completed pills were immediately returned to the patient in their original bottle.  Medication: Hydrocodone/APAP Pill/Patch Count: 03 of 90 pills remain Pill/Patch Appearance: Markings consistent with prescribed medication Bottle Appearance: Standard pharmacy container. Clearly labeled. Filled Date: 05 / 09 / 2019 Last Medication intake:  Today

## 2017-12-27 NOTE — Patient Instructions (Addendum)
You have been given 2 Rx for NORCO/VICODIN to last until 03/26/2018.

## 2017-12-27 NOTE — Progress Notes (Signed)
Patient's Name: Teresa Rhodes  MRN: [id number]  Referring Provider: Idelle Crouch, MD  DOB: 01-14-1971  PCP: Idelle Crouch, MD  DOS: 12/27/2017  Note by: Gillis Santa, MD  Service setting: Ambulatory outpatient  Specialty: Interventional Pain Management  Location: [facility] (AMB) Pain Management Facility    Patient type: Established   Primary Reason(s) for Visit: Encounter for prescription drug management. (Level of risk: moderate)  CC: Medication Refill  HPI  Teresa Rhodes is a 47 y.o. year old, female patient, who comes today for a medication management evaluation. She has White matter abnormality on MRI of brain; Foot drop; Right arm weakness; Lumbosacral radiculopathy; Back pain; Degeneration of intervertebral disc of lumbosacral region; Headache, migraine; Current tobacco use; Nausea & vomiting; Chronic idiopathic constipation; Deep vein thrombosis (DVT) of left lower extremity ([place]); Difficulty sleeping; Headache disorder; Leg swelling; Pinched nerve; Right leg pain; and Seizure ([place]) on their problem list. Her primarily concern today is the Medication Refill  Pain Assessment: Location: Left Back Radiating: Pain radiated down to right lef to right foot Onset: More than a month ago Duration: Chronic pain Quality: Pins and needles, Stabbing, Radiating, Burning, Constant, Sharp, Tingling Severity: 2 /10 (subjective, self-reported pain score)  Note: Reported level is compatible with observation.                         When using our objective Pain Scale, levels between 6 and 10/10 are said to belong in an emergency room, as it progressively worsens from a 6/10, described as severely limiting, requiring emergency care not usually available at an outpatient pain management facility. At a 6/10 level, communication becomes difficult and requires great effort. Assistance to reach the emergency department may be required. Facial flushing and profuse sweating along with potentially dangerous increases  in heart rate and blood pressure will be evident. Effect on ADL: " Hard to push through daily activities"  Timing: Constant Modifying factors: Medications, heating pads, and moist heat wrap  BP: 129/79  HR: 87  Teresa Rhodes was last scheduled for an appointment on 11/15/2017 for medication management. During today's appointment we reviewed Teresa Rhodes's chronic pain status, as well as her outpatient medication regimen.  Chronic pain at baseline.  Has been performing house chores and helped paint her bonus room and build a bed for her son.  She also has a family trip planned this summer.  The patient  reports that she does not use drugs. Her body mass index is 33.23 kg/m.  Further details on both, my assessment(s), as well as the proposed treatment plan, please see below.  Controlled Substance Pharmacotherapy Assessment REMS (Risk Evaluation and Mitigation Strategy)  Analgesic: Hydrocodone 5 mill grams 3 times daily as needed, quantity 45-monthMME/day: 15 mg/day.  .Rise Patience RN  12/27/2017  9:20 AM  Sign at close encounter In addition to meds to be counted, pt. presented with unfilled Rx for NORCO/VICODIN.  RJanne Napoleon RN  12/27/2017  8:58 AM  Sign at close encounter Safety precautions to be maintained throughout the outpatient stay will include: orient to surroundings, keep bed in low position, maintain call bell within reach at all times, provide assistance with transfer out of bed and ambulation.   Nursing Pain Medication Assessment:  Safety precautions to be maintained throughout the outpatient stay will include: orient to surroundings, keep bed in low position, maintain call bell within reach at all times, provide assistance with transfer out of bed  and ambulation.  Medication Inspection Compliance: Pill count conducted under aseptic conditions, in front of the patient. Neither the pills nor the bottle was removed from the patient's sight at any time. Once count was completed  pills were immediately returned to the patient in their original bottle.  Medication: Hydrocodone/APAP Pill/Patch Count: 03 of 90 pills remain Pill/Patch Appearance: Markings consistent with prescribed medication Bottle Appearance: Standard pharmacy container. Clearly labeled. Filled Date: 05 / 09 / 2019 Last Medication intake:  Today   Pharmacokinetics: Liberation and absorption (onset of action): WNL Distribution (time to peak effect): WNL Metabolism and excretion (duration of action): WNL         Pharmacodynamics: Desired effects: Analgesia: Teresa Rhodes reports >50% benefit. Functional ability: Patient reports that medication allows her to accomplish basic ADLs Clinically meaningful improvement in function (CMIF): Sustained CMIF goals met Perceived effectiveness: Described as relatively effective, allowing for increase in activities of daily living (ADL) Undesirable effects: Side-effects or Adverse reactions: None reported Monitoring: [place] PMP: Online review of the past 56-monthperiod conducted. Compliant with practice rules and regulations Last UDS on record: Summary  Date Value Ref Range Status  08/18/2017 FINAL  Final    Comment:    ==================================================================== TOXASSURE COMP DRUG ANALYSIS,UR ==================================================================== Test                             Result       Flag       Units Drug Present and Declared for Prescription Verification   Amphetamine                    530          EXPECTED   ng/mg creat    Amphetamine is available as a schedule II prescription drug.   Desmethyldiazepam              61           EXPECTED   ng/mg creat   Oxazepam                       198          EXPECTED   ng/mg creat   Temazepam                      66           EXPECTED   ng/mg creat    Desmethyldiazepam, oxazepam, and temazepam are expected    metabolites of diazepam. Desmethyldiazepam and oxazepam are  also    expected metabolites of other drugs, including chlordiazepoxide,    prazepam, clorazepate, and halazepam. Oxazepam is an expected    metabolite of temazepam. Oxazepam and temazepam are also    available as scheduled prescription medications. Drug Absent but Declared for Prescription Verification   Tizanidine                     Not Detected UNEXPECTED    Tizanidine, as indicated in the declared medication list, is not    always detected even when used as directed. ==================================================================== Test                      Result    Flag   Units      Ref Range   Creatinine              158  mg/dL      >=20 ==================================================================== Declared Medications:  The flagging and interpretation on this report are based on the  following declared medications.  Unexpected results may arise from  inaccuracies in the declared medications.  **Note: The testing scope of this panel includes these medications:  Amphetamine (Adderall)  Diazepam  **Note: The testing scope of this panel does not include small to  moderate amounts of these reported medications:  Tizanidine  **Note: The testing scope of this panel does not include following  reported medications:  Bisoprolol (Ziac)  Hydrochlorothiazide (Ziac)  Losartan (Losartan Potassium)  Omeprazole  Ranitidine  Sumatriptan ==================================================================== For clinical consultation, please call [phone]. ====================================================================    UDS interpretation: Compliant          Medication Assessment Form: Reviewed. Patient indicates being compliant with therapy Treatment compliance: Compliant Risk Assessment Profile: Aberrant behavior: See prior evaluations. None observed or detected today Comorbid factors increasing risk of overdose: See prior notes. No additional risks  detected today Risk of substance use disorder (SUD): Low Opioid Risk Tool - 12/27/17 0856      Family History of Substance Abuse   Alcohol  Negative    Illegal Drugs  Negative    Rx Drugs  Negative      Personal History of Substance Abuse   Alcohol  Negative    Illegal Drugs  Negative    Rx Drugs  Negative      Age   Age between 70-45 years   Yes      History of Preadolescent Sexual Abuse   History of Preadolescent Sexual Abuse  Negative or Female      Psychological Disease   Psychological Disease  Negative    Depression  Negative      Total Score   Opioid Risk Tool Scoring  1    Opioid Risk Interpretation  Low Risk      ORT Scoring interpretation table:  Score <3 = Low Risk for SUD  Score between 4-7 = Moderate Risk for SUD  Score >8 = High Risk for Opioid Abuse   Risk Mitigation Strategies:  Patient Counseling: Covered Patient-Prescriber Agreement (PPA): Present and active  Notification to other healthcare providers: Done  Pharmacologic Plan: No change in therapy, at this time.             Laboratory Chemistry  Inflammation Markers (CRP: Acute Phase) (ESR: Chronic Phase) Lab Results  Component Value Date   LATICACIDVEN 1.21 11/01/2014                         Rheumatology Markers No results found for: RF, ANA, LABURIC, URICUR, LYMEIGGIGMAB, LYMEABIGMQN, HLAB27                      Renal Function Markers Lab Results  Component Value Date   BUN <5 (L) 04/08/2017   CREATININE 0.55 04/08/2017   BCR 9 06/12/2014   GFRAA >60 04/08/2017   GFRNONAA >60 04/08/2017                             Hepatic Function Markers Lab Results  Component Value Date   AST 27 04/07/2017   ALT 30 04/07/2017   ALBUMIN 4.1 04/07/2017   ALKPHOS 90 04/07/2017   LIPASE 22 04/07/2017  Electrolytes Lab Results  Component Value Date   NA 143 04/08/2017   K 3.9 04/08/2017   CL 110 04/08/2017   CALCIUM 8.4 (L) 04/08/2017                         Neuropathy Markers Lab Results  Component Value Date   VITAMINB12 390 04/16/2014   HIV Non Reactive 04/08/2017                        Bone Pathology Markers No results found for: VD25OH, IN867EH2CNO, BS9628ZM6, QH4765YY5, 25OHVITD1, 25OHVITD2, 25OHVITD3, TESTOFREE, TESTOSTERONE                       Coagulation Parameters Lab Results  Component Value Date   PLT 198 04/08/2017                        Cardiovascular Markers Lab Results  Component Value Date   TROPONINI < 0.02 11/17/2013   HGB 13.1 04/08/2017   HCT 37.5 04/08/2017                         CA Markers No results found for: CEA, CA125, LABCA2                      Note: Lab results reviewed.  Recent Diagnostic Imaging Results  CT CERVICAL SPINE WO CONTRAST CLINICAL DATA:  47 year old female with spine pain. Pain radiating to the right leg with numbness. Prior cervical and lumbar spine surgery without significant improvement.  PROCEDURE: CT CERVICAL MYELOGRAM  CT LUMBAR MYELOGRAM  Lumbar puncture, intrathecal contrast administration, and plain myelogram imaging were performed by Dr. Kathreen Devoid and are reported separately, along with fluoroscopy time.  COMPARISON:  Brain MRI 09/16/2016. CT Abdomen and Pelvis 04/07/2017. Lumbar MRI 12/14/2016, and earlier.  TECHNIQUE: Contiguous axial images were obtained through the Cervical and Lumbar spine after the intrathecal infusion of infusion. Coronal and sagittal reconstructions were obtained of the axial image sets.  FINDINGS: CT CERVICAL MYELOGRAM FINDINGS:  Expected subarachnoid contrast in the cisterna magna and visible basilar cisterns.  Grossly normal visible posterior fossa structures.  Mild thyromegaly. No discrete thyroid nodule is evident. Otherwise negative noncontrast paraspinal soft tissues. No cervical lymphadenopathy. Negative noncontrast thoracic inlet. Negative lung apices.  Straightening of cervical lordosis. No acute osseous  abnormality identified. Postoperative details are below.  Good intrathecal contrast opacification. Normal myelographic appearance of the cervical and upper thoracic spinal cord.  C2-C3:  Minimal right foraminal endplate spurring.  No stenosis.  C3-C4: Minimal circumferential disc bulge and endplate spurring. Borderline to mild facet hypertrophy. No stenosis.  C4-C5: Subtle anterolisthesis. Disc space loss but mild circumferential disc bulge. Endplate spurring involving both foramina. Moderate left and mild right facet hypertrophy. Mild left ligament flavum hypertrophy. No significant spinal stenosis. Moderate to severe left C5 foraminal stenosis. Mild right foraminal stenosis.  C5-C6: Previous ACDF with densely radiopaque interbody implant. No hardware loosening. Solid posterior element arthrodesis. No stenosis is evident.  C6-C7: Previous ACDF with dense radiopaque interbody implant. No hardware loosening. Solid posterior element arthrodesis. No stenosis is evident.  C7-T1: Mild to moderate facet hypertrophy greater on the right. Minor endplate spurring. No spinal stenosis. No convincing foraminal stenosis.  T1-T2: Mild facet hypertrophy. Mild endplate spurring. No convincing foraminal stenosis. No spinal stenosis.  T2-T3: Mild to moderate facet hypertrophy  greater on the right. No stenosis.  CT LUMBAR MYELOGRAM FINDINGS:  For the purposes of this report the same numbering system will be used as on the lumbar MRI in 2018. 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 The previous fused level is L4-L5.  No acute osseous abnormality identified. Stable vertebral height and alignment since 2018. Visible sacrum and SI joints are intact. Postoperative details are below.  Negative visible noncontrast abdominal and pelvic viscera. Postoperative changes to the posterior paraspinal soft tissues at L4 and L5. Small volume of extra thecal contrast along to the right of midline at the  L4-L5 level is along the lumbar puncture trajectory and incidental.  Good intrathecal contrast opacification. Normal myelographic appearance of the lower thoracic spinal cord, and the conus at L1. Trace intrathecal gas related to injection.  T11-T12: Mild to moderate facet hypertrophy.  No stenosis.  T12-L1:  Mild facet and ligament flavum hypertrophy.  No stenosis.  L1-L2:  Mild facet hypertrophy.  No stenosis.  L2-L3: Circumferential but mostly far lateral disc bulging greater on the right. Mild facet hypertrophy, greater on the right. Right far lateral endplate spurring. No spinal or lateral recess stenosis. No foraminal stenosis.  L3-L4: Disc space loss and circumferential disc bulge with mild endplate spurring. Broad-based posterior and foraminal involvement. Moderate facet and ligament flavum hypertrophy. No spinal or convincing lateral recess stenosis. Mild left and borderline to mild right L3 neural foraminal stenosis.  L4-L5: Previous posterior and interbody fusion with right laminectomy. Bilateral transpedicular hardware is in place with no loosening. Interbody implant is in place and appears satisfactory. There is interbody calcification but no convincing interbody arthrodesis. However, there does may be developing arthrodesis through the left posterior elements (sagittal image 42). No spinal stenosis. No lateral recess stenosis is evident. No significant foraminal stenosis.  L5-S1: Mild disc space loss with circumferential disc bulge and endplate spurring. Severe facet hypertrophy greater on the left. Bilateral side vacuum facet. Left facet subchondral sclerosis, and bulky facet overgrowth. Mild epidural lipomatosis. No spinal stenosis. There is mild to moderate left and mild right L5 neural foraminal stenosis primarily due to bony overgrowth. No convincing lateral recess stenosis.  IMPRESSION: Postmyelogram CTs of the cervical and lumbar spine:  CERVICAL  SPINE:  1. Prior cervical ACDF at C5-C6 and C6-C7 with solid arthrodesis and no hardware loosening. 2. Mild overall cervical spine adjacent segment disease, more pronounced at C4-C5 where subtle anterolisthesis is associated with moderate facet arthropathy greater on the left. No spinal stenosis, but moderate to severe left C5 neural foraminal stenosis. 3. No cervical spinal stenosis and no other foraminal stenosis.  LUMBAR SPINE:  1. Transitional lumbosacral anatomy. 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 2. Posterior and interbody fusion sequelae at L4-L5 with no hardware loosening but no convincing arthrodesis. Questionable developing posterior element arthrodesis on the left. 3. Mild adjacent segment disease at L3-L4 with only mild L3 neural foraminal stenosis. 4. Adjacent segment disease at L5-S1 with severe facet arthropathy greater on the left. Bilateral vacuum facet. No convincing spinal or lateral recess stenosis. Up to moderate left and mild right L5 neural foraminal stenosis.  Electronically Signed   By: Genevie Ann M.D.   On: 10/07/2017 12:30 CT LUMBAR SPINE WO CONTRAST CLINICAL DATA:  47 year old female with spine pain. Pain radiating to the right leg with numbness. Prior cervical and lumbar spine surgery without significant improvement.  PROCEDURE: CT CERVICAL MYELOGRAM  CT LUMBAR MYELOGRAM  Lumbar puncture, intrathecal contrast administration, and plain myelogram imaging were performed by Dr. Kathreen Devoid and are reported separately, along with fluoroscopy time.  COMPARISON:  Brain MRI 09/16/2016. CT Abdomen and Pelvis 04/07/2017. Lumbar MRI 12/14/2016, and earlier.  TECHNIQUE: Contiguous axial images were obtained through the Cervical and Lumbar spine after the intrathecal infusion of infusion. Coronal and sagittal reconstructions were obtained of the axial image  sets.  FINDINGS: CT CERVICAL MYELOGRAM FINDINGS:  Expected subarachnoid contrast in the cisterna magna and visible basilar cisterns.  Grossly normal visible posterior fossa structures.  Mild thyromegaly. No discrete thyroid nodule is evident. Otherwise negative noncontrast paraspinal soft tissues. No cervical lymphadenopathy. Negative noncontrast thoracic inlet. Negative lung apices.  Straightening of cervical lordosis. No acute osseous abnormality identified. Postoperative details are below.  Good intrathecal contrast opacification. Normal myelographic appearance of the cervical and upper thoracic spinal cord.  C2-C3:  Minimal right foraminal endplate spurring.  No stenosis.  C3-C4: Minimal circumferential disc bulge and endplate spurring. Borderline to mild facet hypertrophy. No stenosis.  C4-C5: Subtle anterolisthesis. Disc space loss but mild circumferential disc bulge. Endplate spurring involving both foramina. Moderate left and mild right facet hypertrophy. Mild left ligament flavum hypertrophy. No significant spinal stenosis. Moderate to severe left C5 foraminal stenosis. Mild right foraminal stenosis.  C5-C6: Previous ACDF with densely radiopaque interbody implant. No hardware loosening. Solid posterior element arthrodesis. No stenosis is evident.  C6-C7: Previous ACDF with dense radiopaque interbody implant. No hardware loosening. Solid posterior element arthrodesis. No stenosis is evident.  C7-T1: Mild to moderate facet hypertrophy greater on the right. Minor endplate spurring. No spinal stenosis. No convincing foraminal stenosis.  T1-T2: Mild facet hypertrophy. Mild endplate spurring. No convincing foraminal stenosis. No spinal stenosis.  T2-T3: Mild to moderate facet hypertrophy greater on the right. No stenosis.  CT LUMBAR MYELOGRAM FINDINGS:  For the purposes of this report the same numbering system will be used as on the lumbar MRI in 2018. 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 The previous fused level is L4-L5.  No acute osseous abnormality identified. Stable vertebral height and alignment since 2018. Visible sacrum and SI joints are intact. Postoperative details are below.  Negative visible noncontrast abdominal and pelvic viscera. Postoperative changes to the posterior paraspinal soft tissues at L4 and L5. Small volume of extra thecal contrast along to the right of midline at the L4-L5 level is along the lumbar puncture trajectory and incidental.  Good intrathecal contrast opacification. Normal myelographic appearance of the lower thoracic spinal cord, and the conus at L1. Trace intrathecal gas related to injection.  T11-T12: Mild to moderate facet hypertrophy.  No stenosis.  T12-L1:  Mild facet and ligament flavum hypertrophy.  No stenosis.  L1-L2:  Mild facet hypertrophy.  No stenosis.  L2-L3: Circumferential but mostly far lateral disc bulging greater on the right. Mild facet hypertrophy, greater on the right. Right far lateral endplate spurring. No spinal or lateral recess stenosis. No foraminal stenosis.  L3-L4: Disc space loss and circumferential disc bulge with mild endplate spurring. Broad-based posterior and foraminal involvement. Moderate facet and ligament flavum hypertrophy. No spinal or convincing lateral recess stenosis. Mild left and borderline to mild right L3 neural foraminal stenosis.  L4-L5: Previous posterior and interbody fusion with right laminectomy. Bilateral transpedicular hardware is in place with no loosening. Interbody implant is in place and appears satisfactory. There is interbody calcification but no convincing interbody arthrodesis. However, there does may be developing arthrodesis through the left  posterior elements (sagittal image 42). No spinal stenosis. No lateral recess stenosis is evident. No significant foraminal stenosis.  L5-S1: Mild disc space loss with circumferential  disc bulge and endplate spurring. Severe facet hypertrophy greater on the left. Bilateral side vacuum facet. Left facet subchondral sclerosis, and bulky facet overgrowth. Mild epidural lipomatosis. No spinal stenosis. There is mild to moderate left and mild right L5 neural foraminal stenosis primarily due to bony overgrowth. No convincing lateral recess stenosis.  IMPRESSION: Postmyelogram CTs of the cervical and lumbar spine:  CERVICAL SPINE:  1. Prior cervical ACDF at C5-C6 and C6-C7 with solid arthrodesis and no hardware loosening. 2. Mild overall cervical spine adjacent segment disease, more pronounced at C4-C5 where subtle anterolisthesis is associated with moderate facet arthropathy greater on the left. No spinal stenosis, but moderate to severe left C5 neural foraminal stenosis. 3. No cervical spinal stenosis and no other foraminal stenosis.  LUMBAR SPINE:  1. Transitional lumbosacral anatomy. 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 2. Posterior and interbody fusion sequelae at L4-L5 with no hardware loosening but no convincing arthrodesis. Questionable developing posterior element arthrodesis on the left. 3. Mild adjacent segment disease at L3-L4 with only mild L3 neural foraminal stenosis. 4. Adjacent segment disease at L5-S1 with severe facet arthropathy greater on the left. Bilateral vacuum facet. No convincing spinal or lateral recess stenosis. Up to moderate left and mild right L5 neural foraminal stenosis.  Electronically Signed   By: Genevie Ann M.D.   On: 10/07/2017 12:30 DG Myelogram Lumbar CLINICAL DATA:  Cervical and lumbar radiculopathy.  FLUOROSCOPY TIME:  30.2 mGy  PROCEDURE: LUMBAR PUNCTURE FOR CERVICAL AND LUMBAR MYELOGRAM  CERVICAL AND LUMBAR MYELOGRAM  After thorough discussion of risks and benefits of the procedure including bleeding, infection, injury to nerves, blood  vessels, adjacent structures as well as headache and CSF leak, written and oral informed consent was obtained. Consent was obtained by Dr. Kathreen Devoid.  Patient was positioned prone on the fluoroscopy table. Local anesthesia was provided with 1% lidocaine without epinephrine after prepped and draped in the usual sterile fashion. Puncture was performed at L4-5 using a 5 inch 22-gauge spinal needle via midline approach. Using a single pass through the dura, the needle was placed within the thecal sac, with return of clear CSF. 10 mL of [id number]-300 M was injected into the thecal sac, with normal opacification of the nerve roots and cauda equina consistent with free flow within the subarachnoid space. The patient was then moved to the trendelenburg position and contrast flowed into the Cervical spine region.  I personally performed the lumbar puncture and administered the intrathecal contrast. I also personally supervised acquisition of the myelogram images.  FINDINGS: Intrathecal contrast is identified in the lumbar spine and cervical spine. Patient is transported to CT for a CT of cervical spine and lumbar spine dictated separately.  IMPRESSION: Successful cervical and lumbar myelogram prior to CT scan of the cervical spine and lumbar spine.  Electronically Signed   By: Kathreen Devoid   On: 10/07/2017 09:25 DG Myelogram Cervical CLINICAL DATA:  Cervical and lumbar radiculopathy.  FLUOROSCOPY TIME:  30.2 mGy  PROCEDURE: LUMBAR PUNCTURE FOR CERVICAL AND LUMBAR MYELOGRAM  CERVICAL AND LUMBAR MYELOGRAM  After thorough discussion of risks and benefits of the procedure including bleeding, infection, injury to nerves, blood vessels, adjacent structures as well as headache and CSF leak, written and oral informed consent was  obtained. Consent was obtained by Dr. Kathreen Devoid.  Patient was positioned prone on the fluoroscopy table. Local anesthesia was provided with 1% lidocaine  without epinephrine after prepped and draped in the usual sterile fashion. Puncture was performed at L4-5 using a 5 inch 22-gauge spinal needle via midline approach. Using a single pass through the dura, the needle was placed within the thecal sac, with return of clear CSF. 10 mL of Isovue-300 M was injected into the thecal sac, with normal opacification of the nerve roots and cauda equina consistent with free flow within the subarachnoid space. The patient was then moved to the trendelenburg position and contrast flowed into the Cervical spine region.  I personally performed the lumbar puncture and administered the intrathecal contrast. I also personally supervised acquisition of the myelogram images.  FINDINGS: Intrathecal contrast is identified in the lumbar spine and cervical spine. Patient is transported to CT for a CT of cervical spine and lumbar spine dictated separately.  IMPRESSION: Successful cervical and lumbar myelogram prior to CT scan of the cervical spine and lumbar spine.  Electronically Signed   By: Kathreen Devoid   On: 10/07/2017 09:25  Complexity Note: Imaging results reviewed. Results shared with Teresa Rhodes, using Layman's terms.                         Meds   Current Outpatient Medications:  .  amphetamine-dextroamphetamine (ADDERALL) 10 MG tablet, Take 10 mg by mouth 2 (two) times daily with a meal., Disp: , Rfl:  .  HYDROcodone-acetaminophen (NORCO/VICODIN) 5-325 MG tablet, Take 1 tablet by mouth 3 (three) times daily as needed for moderate pain or severe pain. For chronic pain To last for 30 days from fill date To fill on or after: 12/27/17, 01/25/18, 02/24/18, Disp: 90 tablet, Rfl: 0 .  losartan-hydrochlorothiazide (HYZAAR) 100-25 MG tablet, Take 1 tablet by mouth daily., Disp: , Rfl:  .  omeprazole (PRILOSEC) 40 MG capsule, Take 40 mg by mouth daily., Disp: , Rfl:  .  azelastine (ASTELIN) 0.1 % nasal spray, Place 1 spray into both nostrils daily., Disp: , Rfl:   .  bisoprolol-hydrochlorothiazide (ZIAC) 5-6.25 MG tablet, Take 1 tablet by mouth daily., Disp: , Rfl: 11 .  diazepam (VALIUM) 5 MG tablet, Take 5 mg by mouth every 12 (twelve) hours as needed for anxiety. , Disp: , Rfl:  .  omeprazole (PRILOSEC) 40 MG capsule, Take 1 capsule (40 mg total) by mouth daily. Then take once a day as before, Disp: 30 capsule, Rfl: 0 .  ranitidine (ZANTAC) 300 MG tablet, Take by mouth., Disp: , Rfl:  .  SUMAtriptan (IMITREX) 100 MG tablet, Take 1/2-1 tab at headache onset, may repeat once if needed in 2 hours.  No more than 2 in 24 hours., Disp: , Rfl:   ROS  Constitutional: Denies any fever or chills Gastrointestinal: No reported hemesis, hematochezia, vomiting, or acute GI distress Musculoskeletal: Denies any acute onset joint swelling, redness, loss of ROM, or weakness Neurological: No reported episodes of acute onset apraxia, aphasia, dysarthria, agnosia, amnesia, paralysis, loss of coordination, or loss of consciousness  Allergies  Teresa Rhodes is allergic to robaxin [methocarbamol].  PFSH  Drug: Teresa Rhodes  reports that she does not use drugs. Alcohol:  reports that she does not drink alcohol. Tobacco:  reports that she quit smoking about 3 years ago. Her smoking use included cigarettes. She has a 1.50 pack-year smoking history. She has never used smokeless tobacco.  Medical:  has a past medical history of Complication of anesthesia, Depression, Gallstones, Hypertension, Migraine, and Seizures ([place]) (2010-2013). Surgical: Teresa Rhodes  has a past surgical history that includes Cholecystectomy (2002); Neck surgery (2005 & 2009); Back surgery; Esophagogastroduodenoscopy (egd) with propofol (N/A, 04/10/2017); and Colonoscopy with propofol (N/A, 04/10/2017). Family: family history includes Kidney disease (age of onset: 60) in her brother; Kidney disease (age of onset: 38) in her father; Liver disease in her maternal grandfather; Ovarian cancer in her mother; Ulcerative  colitis in her mother.  Constitutional Exam  General appearance: Well nourished, well developed, and well hydrated. In no apparent acute distress Vitals:   12/27/17 0848  BP: 129/79  Pulse: 87  Resp: 16  Temp: 97.9 F (36.6 C)  TempSrc: Oral  SpO2: 99%  Weight: 225 lb (102.1 kg)  Height: '5\' 9"'  (1.753 m)   BMI Assessment: Estimated body mass index is 33.23 kg/m as calculated from the following:   Height as of this encounter: '5\' 9"'  (1.753 m).   Weight as of this encounter: 225 lb (102.1 kg).  BMI interpretation table: BMI level Category Range association with higher incidence of chronic pain  <18 kg/m2 Underweight   18.5-24.9 kg/m2 Ideal body weight   25-29.9 kg/m2 Overweight Increased incidence by 20%  30-34.9 kg/m2 Obese (Class I) Increased incidence by 68%  35-39.9 kg/m2 Severe obesity (Class II) Increased incidence by 136%  >40 kg/m2 Extreme obesity (Class III) Increased incidence by 254%   Patient's current BMI Ideal Body weight  Body mass index is 33.23 kg/m. Ideal body weight: 66.2 kg (145 lb 15.1 oz) Adjusted ideal body weight: 80.5 kg (177 lb 9.1 oz)   BMI Readings from Last 4 Encounters:  12/27/17 33.23 kg/m  11/15/17 34.97 kg/m  10/07/17 35.15 kg/m  09/21/17 35.15 kg/m   Wt Readings from Last 4 Encounters:  12/27/17 225 lb (102.1 kg)  11/15/17 230 lb (104.3 kg)  10/07/17 238 lb (108 kg)  09/21/17 238 lb (108 kg)  Psych/Mental status: Alert, oriented x 3 (person, place, & time)       Eyes: PERLA Respiratory: No evidence of acute respiratory distress  Cervical Spine Area Exam  Skin & Axial Inspection: No masses, redness, edema, swelling, or associated skin lesions Alignment: Symmetrical Functional ROM: Unrestricted ROM      Stability: No instability detected Muscle Tone/Strength: Functionally intact. No obvious neuro-muscular anomalies detected. Sensory (Neurological): Unimpaired Palpation: No palpable anomalies              Upper Extremity (UE)  Exam    Side: Right upper extremity  Side: Left upper extremity  Skin & Extremity Inspection: Skin color, temperature, and hair growth are WNL. No peripheral edema or cyanosis. No masses, redness, swelling, asymmetry, or associated skin lesions. No contractures.  Skin & Extremity Inspection: Skin color, temperature, and hair growth are WNL. No peripheral edema or cyanosis. No masses, redness, swelling, asymmetry, or associated skin lesions. No contractures.  Functional ROM: Unrestricted ROM          Functional ROM: Unrestricted ROM          Muscle Tone/Strength: Functionally intact. No obvious neuro-muscular anomalies detected.  Muscle Tone/Strength: Functionally intact. No obvious neuro-muscular anomalies detected.  Sensory (Neurological): Unimpaired          Sensory (Neurological): Unimpaired          Palpation: No palpable anomalies              Palpation: No palpable anomalies  Provocative Test(s):  Phalen's test: deferred Tinel's test: deferred Apley's scratch test (touch opposite shoulder):  Action 1 (Across chest): deferred Action 2 (Overhead): deferred Action 3 (LB reach): deferred   Provocative Test(s):  Phalen's test: deferred Tinel's test: deferred Apley's scratch test (touch opposite shoulder):  Action 1 (Across chest): deferred Action 2 (Overhead): deferred Action 3 (LB reach): deferred    Thoracic Spine Area Exam  Skin & Axial Inspection: No masses, redness, or swelling Alignment: Symmetrical Functional ROM: Unrestricted ROM Stability: No instability detected Muscle Tone/Strength: Functionally intact. No obvious neuro-muscular anomalies detected. Sensory (Neurological): Unimpaired Muscle strength & Tone: No palpable anomalies  Lumbar Spine Area Exam  Skin & Axial Inspection: Well healed scar from previous spine surgery detected Alignment: Symmetrical Functional ROM: Decreased ROM specifically with lumbar extension       Stability: No instability  detected Muscle Tone/Strength: Increased muscle tone over affected area Sensory (Neurological): Musculoskeletal pain pattern Palpation: Complains of area being tender to palpation       Provocative Tests: Lumbar Hyperextension/rotation test: Positive bilaterally for facet joint pain. Lumbar quadrant test (Kemp's test): (+) bilaterally for facet joint pain. Lumbar Lateral bending test: deferred today       Patrick's Maneuver: deferred today                   FABER test: deferred today       Thigh-thrust test: deferred today       S-I compression test: deferred today       S-I distraction test: deferred today        Gait & Posture Assessment  Ambulation: Unassisted Gait: Relatively normal for age and body habitus Posture: WNL   Lower Extremity Exam    Side: Right lower extremity  Side: Left lower extremity  Stability: No instability observed          Stability: No instability observed          Skin & Extremity Inspection: Edema, color change, ankle brace  Skin & Extremity Inspection: Skin color, temperature, and hair growth are WNL. No peripheral edema or cyanosis. No masses, redness, swelling, asymmetry, or associated skin lesions. No contractures.  Functional ROM: Decreased ROM with plantarflexion and dorsiflexion                  Functional ROM: Unrestricted ROM                  Muscle Tone/Strength: Functionally intact. No obvious neuro-muscular anomalies detected.  Muscle Tone/Strength: Functionally intact. No obvious neuro-muscular anomalies detected.  Sensory (Neurological): Unimpaired  Sensory (Neurological): Unimpaired  Palpation: No palpable anomalies  Palpation: No palpable anomalies  Right lower extremity foot drop, L4, L5 nerve root weakness. Patient unable to dorsiflex her ankle or her great toe suggesting right L4 and right L5pathology   Assessment  Primary Diagnosis & Pertinent Problem List: The primary encounter diagnosis was Failed back surgical syndrome. Diagnoses  of History of fusion of lumbar spine, Lumbar radiculopathy, Cervical fusion syndrome, Cervical radiculopathy, Cervical radiculopathy due to degenerative joint disease of spine, and Chronic pain syndrome were also pertinent to this visit.  Status Diagnosis  Persistent Persistent Persistent 1. Failed back surgical syndrome   2. History of fusion of lumbar spine   3. Lumbar radiculopathy   4. Cervical fusion syndrome   5. Cervical radiculopathy   6. Cervical radiculopathy due to degenerative joint disease of spine   7. Chronic pain syndrome      General Recommendations: The  pain condition that the patient suffers from is best treated with a multidisciplinary approach that involves an increase in physical activity to prevent de-conditioning and worsening of the pain cycle, as well as psychological counseling (formal and/or informal) to address the co-morbid psychological affects of pain. Treatment will often involve judicious use of pain medications and interventional procedures to decrease the pain, allowing the patient to participate in the physical activity that will ultimately produce long-lasting pain reductions. The goal of the multidisciplinary approach is to return the patient to a higher level of overall function and to restore their ability to perform activities of daily living.  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 Patient symptoms are secondary to cervical radiculopathy, cervical fusion syndrome, failed back surgical syndrome, chronic lumbar radiculopathy. Patient has tried various medication including Lyrica, Effexor, Cymbalta, gabapentin, nortriptyline, Flexeril, Robaxin. These medications had limited benefit or not effective in  achieving effective pain control for the patient. She has had a series of 9 epidural steroid injections. She states that these were only helpful for approximately 3-4 weeks. She has been talked to about spinal cord stimulation but she states that we will not make her right foot drop any better. We did have a discussion about spinal cord stimulation and I believe that this therapy could be potentially beneficial and helping to reduce the patient's right lower extremity pain but not necessarily her foot drop symptoms.Patient wants to hold off at this time.  CT cervical and lumbar spine performed 10/07/17.  Cervical spine confirms ACDF at C5-C6 and C6-C7 with solid arthrodesis, C4-C5 subtle retrolisthesis with moderate facet arthropathy, left greater than right.  Regards to her lumbar spine, patient has transitional lumbosacral anatomy with a history of a posterior interbody fusion at L4-L5 with no hardware loosening and questionable developing posterior element arthrodesis on the left.  She does have mild adjacent segment disease at L3-4 with only mild L3 neuroforaminal stenosis.  She does have adjacent segment disease at L5-S1 with severe facet arthropathy great on the left.  This results in a bilateral vacuum effect.  Also in regards to interventional therapies, I discussed  caudal ESI for her chronic lumbosacral radiculopathy along bilateral L5-S1 facet medial branch nerve block for L5-S1 severe facet arthropathy however patient wants to hold off.  Bowel habits have improved with Miralax and high fiber diet. Is keeping herself hydrated. Discussed bowel regimen with patient.  Will refill Hydrocodone as below for 2 months only. (IGNORE 6/11 as patient already has 6/7 Rx). Only 2 Rx provided: 01/25/18 and 02/24/18  Continue Imitrex 100 mg prn migraines  Plan of Care  Pharmacotherapy (Medications Ordered): Meds ordered this encounter  Medications  . DISCONTD: HYDROcodone-acetaminophen (NORCO/VICODIN)  5-325 MG tablet    Sig: Take 1 tablet by mouth 3 (three) times daily as needed for moderate pain or severe pain. For chronic pain To last for 30 days from fill date To fill on or after: 12/27/17, 01/25/18, 02/24/18    Dispense:  90 tablet    Refill:  0    Do not place this medication, or any other prescription from our practice, on "Automatic Refill". Patient may have prescription filled one day early if pharmacy is closed on scheduled refill date.  Marland Kitchen DISCONTD: HYDROcodone-acetaminophen (NORCO/VICODIN)  5-325 MG tablet    Sig: Take 1 tablet by mouth 3 (three) times daily as needed for moderate pain or severe pain. For chronic pain To last for 30 days from fill date To fill on or after: 12/27/17, 01/25/18, 02/24/18    Dispense:  90 tablet    Refill:  0    Do not place this medication, or any other prescription from our practice, on "Automatic Refill". Patient may have prescription filled one day early if pharmacy is closed on scheduled refill date.  Marland Kitchen HYDROcodone-acetaminophen (NORCO/VICODIN) 5-325 MG tablet    Sig: Take 1 tablet by mouth 3 (three) times daily as needed for moderate pain or severe pain. For chronic pain To last for 30 days from fill date To fill on or after: 12/27/17, 01/25/18, 02/24/18    Dispense:  90 tablet    Refill:  0    Do not place this medication, or any other prescription from our practice, on "Automatic Refill". Patient may have prescription filled one day early if pharmacy is closed on scheduled refill date.   Time Note: Greater than 50% of the 25 minute(s) of face-to-face time spent with Teresa Rhodes, was spent in counseling/coordination of care regarding: Teresa Rhodes primary cause of pain, the treatment plan, treatment alternatives, the risks and possible complications of proposed treatment, medication side effects, the opioid analgesic risks and possible complications, the appropriate use of her medications, realistic expectations, the goals of pain management (increased in  functionality), the medication agreement and the patient's responsibilities when it comes to controlled substances.  Provider-requested follow-up: Return in about 3 months (around 03/30/2018) for Medication Management.  Future Appointments  Date Time Provider [place]  03/28/2018  8:30 AM Gillis Santa, MD [facility] None    Primary Care Physician: Idelle Crouch, MD Location: [facility] Note by: Gillis Santa, M.D Date: 12/27/2017; Time: 9:58 AM  Patient Instructions  You have been given 2 Rx for NORCO/VICODIN to last until 03/26/2018.

## 2017-12-27 NOTE — Progress Notes (Signed)
In addition to meds to be counted, pt. presented with unfilled Rx for NORCO/VICODIN.

## 2018-01-30 DIAGNOSIS — E7800 Pure hypercholesterolemia, unspecified: Secondary | ICD-10-CM | POA: Diagnosis not present

## 2018-01-30 DIAGNOSIS — I10 Essential (primary) hypertension: Secondary | ICD-10-CM | POA: Diagnosis not present

## 2018-01-30 DIAGNOSIS — E559 Vitamin D deficiency, unspecified: Secondary | ICD-10-CM | POA: Diagnosis not present

## 2018-01-30 DIAGNOSIS — Z79899 Other long term (current) drug therapy: Secondary | ICD-10-CM | POA: Diagnosis not present

## 2018-02-11 ENCOUNTER — Emergency Department
Admission: EM | Admit: 2018-02-11 | Discharge: 2018-02-11 | Disposition: A | Discharge status: Home / Self Care | Payer: 59 | Attending: Emergency Medicine | Admitting: Emergency Medicine

## 2018-02-11 ENCOUNTER — Encounter: Payer: Self-pay | Admitting: Emergency Medicine

## 2018-02-11 ENCOUNTER — Emergency Department: Payer: 59

## 2018-02-11 ENCOUNTER — Other Ambulatory Visit: Payer: Self-pay

## 2018-02-11 DIAGNOSIS — R079 Chest pain, unspecified: Secondary | ICD-10-CM | POA: Diagnosis not present

## 2018-02-11 DIAGNOSIS — Z5321 Procedure and treatment not carried out due to patient leaving prior to being seen by health care provider: Secondary | ICD-10-CM | POA: Diagnosis not present

## 2018-02-11 LAB — BASIC METABOLIC PANEL
Anion gap: 11 (ref 5–15)
BUN: 5 mg/dL — ABNORMAL LOW (ref 6–20)
CALCIUM: 9.8 mg/dL (ref 8.9–10.3)
CO2: 28 mmol/L (ref 22–32)
Chloride: 100 mmol/L (ref 98–111)
Creatinine, Ser: 0.53 mg/dL (ref 0.44–1.00)
GFR calc Af Amer: >60 mL/min (ref >60)
Glucose, Bld: 115 mg/dL — ABNORMAL HIGH (ref 70–99)
Potassium: 3.3 mmol/L — ABNORMAL LOW (ref 3.5–5.1)
SODIUM: 139 mmol/L (ref 135–145)

## 2018-02-11 LAB — CBC
HEMATOCRIT: 41.5 % (ref 35.0–47.0)
HEMOGLOBIN: 14.4 g/dL (ref 12.0–16.0)
MCH: 29.2 pg (ref 26.0–34.0)
MCHC: 34.6 g/dL (ref 32.0–36.0)
MCV: 84.3 fL (ref 80.0–100.0)
Platelets: 233 10*3/uL (ref 150–440)
RBC: 4.92 MIL/uL (ref 3.80–5.20)
RDW: 13.2 % (ref 11.5–14.5)
WBC: 12.3 10*3/uL — ABNORMAL HIGH (ref 3.6–11.0)

## 2018-02-11 LAB — TROPONIN I: Troponin I: <0.03 ng/mL (ref <0.03)

## 2018-02-11 NOTE — ED Notes (Signed)
Up to stat desk states she is going to go home.  Patient encouraged to stay but declined.  Instructed to return if any worsening or new symptoms.

## 2018-02-11 NOTE — ED Triage Notes (Signed)
Pt arrives ambulatory to triage with c/o chest pain all day. Pt reports one episode of vomiting. Pt reports that pain radiates between her shoulder blades up into her neck. Pt in in NAD.

## 2018-02-15 DIAGNOSIS — R0789 Other chest pain: Secondary | ICD-10-CM | POA: Diagnosis not present

## 2018-02-15 DIAGNOSIS — R112 Nausea with vomiting, unspecified: Secondary | ICD-10-CM | POA: Diagnosis not present

## 2018-02-15 DIAGNOSIS — M542 Cervicalgia: Secondary | ICD-10-CM | POA: Diagnosis not present

## 2018-02-16 DIAGNOSIS — S8002XA Contusion of left knee, initial encounter: Secondary | ICD-10-CM | POA: Diagnosis not present

## 2018-02-28 DIAGNOSIS — M25562 Pain in left knee: Secondary | ICD-10-CM | POA: Diagnosis not present

## 2018-03-02 DIAGNOSIS — M79671 Pain in right foot: Secondary | ICD-10-CM | POA: Diagnosis not present

## 2018-03-02 DIAGNOSIS — M545 Low back pain: Secondary | ICD-10-CM | POA: Diagnosis not present

## 2018-03-02 DIAGNOSIS — M25562 Pain in left knee: Secondary | ICD-10-CM | POA: Diagnosis not present

## 2018-03-08 DIAGNOSIS — M545 Low back pain: Secondary | ICD-10-CM | POA: Diagnosis not present

## 2018-03-08 DIAGNOSIS — M79671 Pain in right foot: Secondary | ICD-10-CM | POA: Diagnosis not present

## 2018-03-08 DIAGNOSIS — M25562 Pain in left knee: Secondary | ICD-10-CM | POA: Diagnosis not present

## 2018-03-10 DIAGNOSIS — M6281 Muscle weakness (generalized): Secondary | ICD-10-CM | POA: Diagnosis not present

## 2018-03-10 DIAGNOSIS — M545 Low back pain: Secondary | ICD-10-CM | POA: Diagnosis not present

## 2018-03-10 DIAGNOSIS — M25562 Pain in left knee: Secondary | ICD-10-CM | POA: Diagnosis not present

## 2018-03-14 DIAGNOSIS — M6281 Muscle weakness (generalized): Secondary | ICD-10-CM | POA: Diagnosis not present

## 2018-03-14 DIAGNOSIS — M545 Low back pain: Secondary | ICD-10-CM | POA: Diagnosis not present

## 2018-03-14 DIAGNOSIS — M25562 Pain in left knee: Secondary | ICD-10-CM | POA: Diagnosis not present

## 2018-03-23 DIAGNOSIS — M25562 Pain in left knee: Secondary | ICD-10-CM | POA: Diagnosis not present

## 2018-03-23 DIAGNOSIS — M545 Low back pain: Secondary | ICD-10-CM | POA: Diagnosis not present

## 2018-03-23 DIAGNOSIS — M6281 Muscle weakness (generalized): Secondary | ICD-10-CM | POA: Diagnosis not present

## 2018-03-28 ENCOUNTER — Encounter: Payer: 59 | Admitting: Student in an Organized Health Care Education/Training Program

## 2018-03-28 DIAGNOSIS — M25562 Pain in left knee: Secondary | ICD-10-CM | POA: Diagnosis not present

## 2018-03-28 DIAGNOSIS — M6281 Muscle weakness (generalized): Secondary | ICD-10-CM | POA: Diagnosis not present

## 2018-03-28 DIAGNOSIS — M545 Low back pain: Secondary | ICD-10-CM | POA: Diagnosis not present

## 2018-03-30 ENCOUNTER — Other Ambulatory Visit: Payer: Self-pay

## 2018-03-30 ENCOUNTER — Ambulatory Visit
Payer: 59 | Attending: Student in an Organized Health Care Education/Training Program | Admitting: Student in an Organized Health Care Education/Training Program

## 2018-03-30 ENCOUNTER — Encounter: Payer: Self-pay | Admitting: Student in an Organized Health Care Education/Training Program

## 2018-03-30 VITALS — BP 131/86 | HR 92 | Temp 98.2°F | Resp 16 | Ht 69.0 in | Wt 210.0 lb

## 2018-03-30 DIAGNOSIS — M961 Postlaminectomy syndrome, not elsewhere classified: Secondary | ICD-10-CM | POA: Diagnosis not present

## 2018-03-30 DIAGNOSIS — M5412 Radiculopathy, cervical region: Secondary | ICD-10-CM | POA: Diagnosis not present

## 2018-03-30 DIAGNOSIS — M25562 Pain in left knee: Secondary | ICD-10-CM | POA: Diagnosis not present

## 2018-03-30 DIAGNOSIS — M48061 Spinal stenosis, lumbar region without neurogenic claudication: Secondary | ICD-10-CM | POA: Insufficient documentation

## 2018-03-30 DIAGNOSIS — Z87891 Personal history of nicotine dependence: Secondary | ICD-10-CM | POA: Diagnosis not present

## 2018-03-30 DIAGNOSIS — M21371 Foot drop, right foot: Secondary | ICD-10-CM | POA: Insufficient documentation

## 2018-03-30 DIAGNOSIS — Z760 Encounter for issue of repeat prescription: Secondary | ICD-10-CM | POA: Insufficient documentation

## 2018-03-30 DIAGNOSIS — M4722 Other spondylosis with radiculopathy, cervical region: Secondary | ICD-10-CM

## 2018-03-30 DIAGNOSIS — Z79899 Other long term (current) drug therapy: Secondary | ICD-10-CM | POA: Insufficient documentation

## 2018-03-30 DIAGNOSIS — Z981 Arthrodesis status: Secondary | ICD-10-CM | POA: Diagnosis not present

## 2018-03-30 DIAGNOSIS — T402X5A Adverse effect of other opioids, initial encounter: Secondary | ICD-10-CM | POA: Insufficient documentation

## 2018-03-30 DIAGNOSIS — I82402 Acute embolism and thrombosis of unspecified deep veins of left lower extremity: Secondary | ICD-10-CM | POA: Diagnosis not present

## 2018-03-30 DIAGNOSIS — Q761 Klippel-Feil syndrome: Secondary | ICD-10-CM

## 2018-03-30 DIAGNOSIS — Z5181 Encounter for therapeutic drug level monitoring: Secondary | ICD-10-CM | POA: Insufficient documentation

## 2018-03-30 DIAGNOSIS — I10 Essential (primary) hypertension: Secondary | ICD-10-CM | POA: Diagnosis not present

## 2018-03-30 DIAGNOSIS — K5903 Drug induced constipation: Secondary | ICD-10-CM | POA: Insufficient documentation

## 2018-03-30 DIAGNOSIS — M545 Low back pain: Secondary | ICD-10-CM | POA: Diagnosis not present

## 2018-03-30 DIAGNOSIS — G43909 Migraine, unspecified, not intractable, without status migrainosus: Secondary | ICD-10-CM | POA: Insufficient documentation

## 2018-03-30 DIAGNOSIS — F329 Major depressive disorder, single episode, unspecified: Secondary | ICD-10-CM | POA: Insufficient documentation

## 2018-03-30 DIAGNOSIS — M6281 Muscle weakness (generalized): Secondary | ICD-10-CM | POA: Diagnosis not present

## 2018-03-30 DIAGNOSIS — M5416 Radiculopathy, lumbar region: Secondary | ICD-10-CM | POA: Diagnosis not present

## 2018-03-30 DIAGNOSIS — G894 Chronic pain syndrome: Secondary | ICD-10-CM | POA: Diagnosis not present

## 2018-03-30 DIAGNOSIS — R569 Unspecified convulsions: Secondary | ICD-10-CM | POA: Insufficient documentation

## 2018-03-30 MED ORDER — HYDROCODONE-ACETAMINOPHEN 5-325 MG PO TABS: 1.0000 | ORAL_TABLET | Freq: Three times a day (TID) | ORAL | 0 refills | Status: DC | PRN

## 2018-03-30 NOTE — Progress Notes (Signed)
Nursing Pain Medication Assessment:  Safety precautions to be maintained throughout the outpatient stay will include: orient to surroundings, keep bed in low position, maintain call bell within reach at all times, provide assistance with transfer out of bed and ambulation.  Medication Inspection Compliance: Pill count conducted under aseptic conditions, in front of the patient. Neither the pills nor the bottle was removed from the patient's sight at any time. Once count was completed pills were immediately returned to the patient in their original bottle.  Medication: Hydrocodone/APAP Pill/Patch Count: 21 of 90 pills remain Pill/Patch Appearance: Markings consistent with prescribed medication Bottle Appearance: Standard pharmacy container. Clearly labeled. Filled Date: 8 / 51 / 2019 Last Medication intake:  Today

## 2018-03-30 NOTE — Patient Instructions (Signed)
Hydrocodone with acetaminophen to last until 06/06/18 has been escribed to your pharmacy.

## 2018-03-30 NOTE — Progress Notes (Signed)
Patient's Name: Teresa Rhodes  MRN: [id number]  Referring Provider: Idelle Crouch, MD  DOB: [birth year]  PCP: Idelle Crouch, MD  DOS: 03/30/2018  Note by: Gillis Santa, MD  Service setting: Ambulatory outpatient  Specialty: Interventional Pain Management  Location: [facility] (AMB) Pain Management Facility    Patient type: Established   Primary Reason(s) for Visit: Encounter for prescription drug management. (Level of risk: moderate)  CC: Back Pain (lower) and Leg Pain (right)  HPI  Teresa Rhodes is a 47 y.o. year old, female patient, who comes today for a medication management evaluation. She has White matter abnormality on MRI of brain; Foot drop; Right arm weakness; Lumbosacral radiculopathy; Back pain; Degeneration of intervertebral disc of lumbosacral region; Headache, migraine; Current tobacco use; Nausea & vomiting; Chronic idiopathic constipation; Deep vein thrombosis (DVT) of left lower extremity ([place]); Difficulty sleeping; Headache disorder; Leg swelling; Pinched nerve; Right leg pain; and Seizure ([place]) on their problem list. Her primarily concern today is the Back Pain (lower) and Leg Pain (right)  Pain Assessment: Location: Lower Back Radiating: down right leg to foot; pain in leg is intermittent, pain in left foot is constant Onset: More than a month ago Duration: Chronic pain Quality: Constant, Aching, Burning Severity: 3 /10 (subjective, self-reported pain score)  Note: Reported level is compatible with observation.                         When using our objective Pain Scale, levels between 6 and 10/10 are said to belong in an emergency room, as it progressively worsens from a 6/10, described as severely limiting, requiring emergency care not usually available at an outpatient pain management facility. At a 6/10 level, communication becomes difficult and requires great effort. Assistance to reach the emergency department may be required. Facial flushing and profuse sweating along with  potentially dangerous increases in heart rate and blood pressure will be evident. Effect on ADL:   Timing: Constant Modifying factors: meds, heat, ice, rest BP: 131/86  HR: 92  Teresa Rhodes was last scheduled for an appointment on 03/28/2018 for medication management. During today's appointment we reviewed Teresa Rhodes's chronic pain status, as well as her outpatient medication regimen.  The patient  reports that she does not use drugs. Her body mass index is 31.01 kg/m.  Further details on both, my assessment(s), as well as the proposed treatment plan, please see below.  Controlled Substance Pharmacotherapy Assessment REMS (Risk Evaluation and Mitigation Strategy)  Analgesic:Hydrocodone 5 mill grams 3 times daily as needed, quantity 45-monthMME/day:173mday.   WeRise PatienceRN  03/30/2018  2:46 PM  Signed Nursing Pain Medication Assessment:  Safety precautions to be maintained throughout the outpatient stay will include: orient to surroundings, keep bed in low position, maintain call bell within reach at all times, provide assistance with transfer out of bed and ambulation.  Medication Inspection Compliance: Pill count conducted under aseptic conditions, in front of the patient. Neither the pills nor the bottle was removed from the patient's sight at any time. Once count was completed pills were immediately returned to the patient in their original bottle.  Medication: Hydrocodone/APAP Pill/Patch Count: 21 of 90 pills remain Pill/Patch Appearance: Markings consistent with prescribed medication Bottle Appearance: Standard pharmacy container. Clearly labeled. Filled Date: 8 / 1116 2019 Last Medication intake:  Today Pharmacokinetics: Liberation and absorption (onset of action): WNL Distribution (time to peak effect): WNL Metabolism and excretion (duration of action): WNL  Pharmacodynamics: Desired effects: Analgesia: Teresa Rhodes reports >50% benefit. Functional ability: Patient  reports that medication allows her to accomplish basic ADLs Clinically meaningful improvement in function (CMIF): Sustained CMIF goals met Perceived effectiveness: Described as relatively effective, allowing for increase in activities of daily living (ADL) Undesirable effects: Side-effects or Adverse reactions: None reported Monitoring: [place] PMP: Online review of the past 63-monthperiod conducted. Compliant with practice rules and regulations Last UDS on record: Summary  Date Value Ref Range Status  08/18/2017 FINAL  Final    Comment:    ==================================================================== TOXASSURE COMP DRUG ANALYSIS,UR ==================================================================== Test                             Result       Flag       Units Drug Present and Declared for Prescription Verification   Amphetamine                    530          EXPECTED   ng/mg creat    Amphetamine is available as a schedule II prescription drug.   Desmethyldiazepam              61           EXPECTED   ng/mg creat   Oxazepam                       198          EXPECTED   ng/mg creat   Temazepam                      66           EXPECTED   ng/mg creat    Desmethyldiazepam, oxazepam, and temazepam are expected    metabolites of diazepam. Desmethyldiazepam and oxazepam are also    expected metabolites of other drugs, including chlordiazepoxide,    prazepam, clorazepate, and halazepam. Oxazepam is an expected    metabolite of temazepam. Oxazepam and temazepam are also    available as scheduled prescription medications. Drug Absent but Declared for Prescription Verification   Tizanidine                     Not Detected UNEXPECTED    Tizanidine, as indicated in the declared medication list, is not    always detected even when used as directed. ==================================================================== Test                      Result    Flag   Units      Ref Range    Creatinine              158              mg/dL      >=20 ==================================================================== Declared Medications:  The flagging and interpretation on this report are based on the  following declared medications.  Unexpected results may arise from  inaccuracies in the declared medications.  **Note: The testing scope of this panel includes these medications:  Amphetamine (Adderall)  Diazepam  **Note: The testing scope of this panel does not include small to  moderate amounts of these reported medications:  Tizanidine  **Note: The testing scope of this panel does not include following  reported medications:  Bisoprolol (Ziac)  Hydrochlorothiazide (Ziac)  Losartan (Losartan Potassium)  Omeprazole  Ranitidine  Sumatriptan ==================================================================== For clinical consultation, please call [phone]. ====================================================================    UDS interpretation: Compliant          Medication Assessment Form: Reviewed. Patient indicates being compliant with therapy Treatment compliance: Compliant Risk Assessment Profile: Aberrant behavior: See prior evaluations. None observed or detected today Comorbid factors increasing risk of overdose: See prior notes. No additional risks detected today Opioid risk tool (ORT) (Total Score): 1 Personal History of Substance Abuse (SUD-Substance use disorder):  Alcohol: Negative  Illegal Drugs: Negative  Rx Drugs: Negative  ORT Risk Level calculation: Low Risk Risk of substance use disorder (SUD): Low Opioid Risk Tool - 03/30/18 1447      Family History of Substance Abuse   Alcohol  Negative    Illegal Drugs  Negative    Rx Drugs  Negative      Personal History of Substance Abuse   Alcohol  Negative    Illegal Drugs  Negative    Rx Drugs  Negative      Age   Age between 61-45 years   Yes      Psychological Disease   Psychological  Disease  Negative    Depression  Negative      Total Score   Opioid Risk Tool Scoring  1    Opioid Risk Interpretation  Low Risk      ORT Scoring interpretation table:  Score <3 = Low Risk for SUD  Score between 4-7 = Moderate Risk for SUD  Score >8 = High Risk for Opioid Abuse   Risk Mitigation Strategies:  Patient Counseling: Covered Patient-Prescriber Agreement (PPA): Present and active  Notification to other healthcare providers: Done  Pharmacologic Plan: No change in therapy, at this time.             Laboratory Chemistry  Inflammation Markers (CRP: Acute Phase) (ESR: Chronic Phase) Lab Results  Component Value Date   LATICACIDVEN 1.21 11/01/2014                         Rheumatology Markers No results found for: RF, ANA, LABURIC, URICUR, LYMEIGGIGMAB, LYMEABIGMQN, HLAB27                      Renal Function Markers Lab Results  Component Value Date   BUN 5 (L) 02/11/2018   CREATININE 0.53 02/11/2018   BCR 9 06/12/2014   GFRAA >60 02/11/2018   GFRNONAA >60 02/11/2018                             Hepatic Function Markers Lab Results  Component Value Date   AST 27 04/07/2017   ALT 30 04/07/2017   ALBUMIN 4.1 04/07/2017   ALKPHOS 90 04/07/2017   LIPASE 22 04/07/2017                        Electrolytes Lab Results  Component Value Date   NA 139 02/11/2018   K 3.3 (L) 02/11/2018   CL 100 02/11/2018   CALCIUM 9.8 02/11/2018                        Neuropathy Markers Lab Results  Component Value Date   VITAMINB12 390 04/16/2014   HIV Non Reactive 04/08/2017  CNS Tests Lab Results  Component Value Date   COLORCSF PINK (A) 11/11/2016   APPEARCSF HAZY (A) 11/11/2016   RBCCOUNTCSF 2,105 (H) 11/11/2016   WBCCSF 8 (H) 11/11/2016   POLYSCSF 0 11/11/2016   LYMPHSCSF 90 11/11/2016   EOSCSF 0 11/11/2016   PROTEINCSF 38 11/11/2016   GLUCCSF 66 11/11/2016   JCVIRUS Negative 11/11/2016   CSFOLI Comment 11/11/2016   IGGCSF 2.1  11/11/2016                        Bone Pathology Markers No results found for: Cyril, VD125OH2TOT, UG8916XI5, WT8882CM0, 25OHVITD1, 25OHVITD2, 25OHVITD3, TESTOFREE, TESTOSTERONE                       Coagulation Parameters Lab Results  Component Value Date   PLT 233 02/11/2018                        Cardiovascular Markers Lab Results  Component Value Date   TROPONINI <0.03 02/11/2018   HGB 14.4 02/11/2018   HCT 41.5 02/11/2018                         CA Markers No results found for: CEA, CA125, LABCA2                      Note: Lab results reviewed.  Recent Diagnostic Imaging Results  DG Chest 2 View CLINICAL DATA:  Chest pain.  EXAM: CHEST - 2 VIEW  COMPARISON:  February 03, 2017  FINDINGS: The heart size and mediastinal contours are within normal limits. Both lungs are clear. The visualized skeletal structures are unremarkable.  IMPRESSION: No active cardiopulmonary disease.  Electronically Signed   By: Dorise Bullion III M.D   On: 02/11/2018 21:07  Complexity Note: Imaging results reviewed. Results shared with Ms. Schimpf, using Layman's terms.                         Meds   Current Outpatient Medications:  .  amphetamine-dextroamphetamine (ADDERALL) 10 MG tablet, Take 10 mg by mouth 2 (two) times daily with a meal., Disp: , Rfl:  .  azelastine (ASTELIN) 0.1 % nasal spray, Place 1 spray into both nostrils daily., Disp: , Rfl:  .  diazepam (VALIUM) 5 MG tablet, Take 5 mg by mouth every 12 (twelve) hours as needed for anxiety. , Disp: , Rfl:  .  [START ON 04/07/2018] HYDROcodone-acetaminophen (NORCO/VICODIN) 5-325 MG tablet, Take 1 tablet by mouth 3 (three) times daily as needed for moderate pain or severe pain. For chronic pain, Disp: 90 tablet, Rfl: 0 .  losartan-hydrochlorothiazide (HYZAAR) 100-25 MG tablet, Take 1 tablet by mouth daily., Disp: , Rfl:  .  ranitidine (ZANTAC) 300 MG tablet, Take by mouth., Disp: , Rfl:  .  SUMAtriptan (IMITREX) 100 MG tablet,  Take 1/2-1 tab at headache onset, may repeat once if needed in 2 hours.  No more than 2 in 24 hours., Disp: , Rfl:  .  bisoprolol-hydrochlorothiazide (ZIAC) 5-6.25 MG tablet, Take 1 tablet by mouth daily., Disp: , Rfl: 11 .  [START ON 05/07/2018] HYDROcodone-acetaminophen (NORCO/VICODIN) 5-325 MG tablet, Take 1 tablet by mouth 3 (three) times daily as needed for moderate pain., Disp: 90 tablet, Rfl: 0 .  omeprazole (PRILOSEC) 40 MG capsule, Take 1 capsule (40 mg total) by mouth daily. Then take once a  day as before, Disp: 30 capsule, Rfl: 0 .  omeprazole (PRILOSEC) 40 MG capsule, Take 40 mg by mouth daily., Disp: , Rfl:   ROS  Constitutional: Denies any fever or chills Gastrointestinal: No reported hemesis, hematochezia, vomiting, or acute GI distress Musculoskeletal: Denies any acute onset joint swelling, redness, loss of ROM, or weakness Neurological: No reported episodes of acute onset apraxia, aphasia, dysarthria, agnosia, amnesia, paralysis, loss of coordination, or loss of consciousness  Allergies  Ms. Samonte is allergic to robaxin [methocarbamol].  PFSH  Drug: Ms. Weissberg  reports that she does not use drugs. Alcohol:  reports that she does not drink alcohol. Tobacco:  reports that she quit smoking about 3 years ago. Her smoking use included cigarettes. She has a 1.50 pack-year smoking history. She has never used smokeless tobacco. Medical:  has a past medical history of Complication of anesthesia, Depression, Gallstones, Hypertension, Migraine, and Seizures ([place]) (2010-2013). Surgical: Ms. Mellette  has a past surgical history that includes Cholecystectomy (2002); Neck surgery (2005 & 2009); Back surgery; Esophagogastroduodenoscopy (egd) with propofol (N/A, 04/10/2017); and Colonoscopy with propofol (N/A, 04/10/2017). Family: family history includes Kidney disease (age of onset: 66) in her brother; Kidney disease (age of onset: 31) in her father; Liver disease in her maternal grandfather;  Ovarian cancer in her mother; Ulcerative colitis in her mother.  Constitutional Exam  General appearance: Well nourished, well developed, and well hydrated. In no apparent acute distress Vitals:   03/30/18 1436  BP: 131/86  Pulse: 92  Resp: 16  Temp: 98.2 F (36.8 C)  TempSrc: Oral  SpO2: 99%  Weight: 210 lb (95.3 kg)  Height: '5\' 9"'  (1.753 m)   BMI Assessment: Estimated body mass index is 31.01 kg/m as calculated from the following:   Height as of this encounter: '5\' 9"'  (1.753 m).   Weight as of this encounter: 210 lb (95.3 kg).  BMI interpretation table: BMI level Category Range association with higher incidence of chronic pain  <18 kg/m2 Underweight   18.5-24.9 kg/m2 Ideal body weight   25-29.9 kg/m2 Overweight Increased incidence by 20%  30-34.9 kg/m2 Obese (Class I) Increased incidence by 68%  35-39.9 kg/m2 Severe obesity (Class II) Increased incidence by 136%  >40 kg/m2 Extreme obesity (Class III) Increased incidence by 254%   Patient's current BMI Ideal Body weight  Body mass index is 31.01 kg/m. Ideal body weight: 66.2 kg (145 lb 15.1 oz) Adjusted ideal body weight: 77.8 kg (171 lb 9.1 oz)   BMI Readings from Last 4 Encounters:  03/30/18 31.01 kg/m  02/11/18 31.75 kg/m  12/27/17 33.23 kg/m  11/15/17 34.97 kg/m   Wt Readings from Last 4 Encounters:  03/30/18 210 lb (95.3 kg)  02/11/18 215 lb (97.5 kg)  12/27/17 225 lb (102.1 kg)  11/15/17 230 lb (104.3 kg)  Psych/Mental status: Alert, oriented x 3 (person, place, & time)       Eyes: PERLA Respiratory: No evidence of acute respiratory distress  Cervical Spine Area Exam  Skin & Axial Inspection: No masses, redness, edema, swelling, or associated skin lesions Alignment: Symmetrical Functional ROM: Unrestricted ROM      Stability: No instability detected Muscle Tone/Strength: Functionally intact. No obvious neuro-muscular anomalies detected. Sensory (Neurological): Unimpaired Palpation: No palpable  anomalies              Upper Extremity (UE) Exam    Side: Right upper extremity  Side: Left upper extremity  Skin & Extremity Inspection: Skin color, temperature, and hair growth are WNL. No  peripheral edema or cyanosis. No masses, redness, swelling, asymmetry, or associated skin lesions. No contractures.  Skin & Extremity Inspection: Skin color, temperature, and hair growth are WNL. No peripheral edema or cyanosis. No masses, redness, swelling, asymmetry, or associated skin lesions. No contractures.  Functional ROM: Unrestricted ROM          Functional ROM: Unrestricted ROM          Muscle Tone/Strength: Functionally intact. No obvious neuro-muscular anomalies detected.  Muscle Tone/Strength: Functionally intact. No obvious neuro-muscular anomalies detected.  Sensory (Neurological): Unimpaired          Sensory (Neurological): Unimpaired          Palpation: No palpable anomalies              Palpation: No palpable anomalies              Provocative Test(s):  Phalen's test: deferred Tinel's test: deferred Apley's scratch test (touch opposite shoulder):  Action 1 (Across chest): deferred Action 2 (Overhead): deferred Action 3 (LB reach): deferred   Provocative Test(s):  Phalen's test: deferred Tinel's test: deferred Apley's scratch test (touch opposite shoulder):  Action 1 (Across chest): deferred Action 2 (Overhead): deferred Action 3 (LB reach): deferred    Thoracic Spine Area Exam  Skin & Axial Inspection: No masses, redness, or swelling Alignment: Symmetrical Functional ROM: Unrestricted ROM Stability: No instability detected Muscle Tone/Strength: Functionally intact. No obvious neuro-muscular anomalies detected. Sensory (Neurological): Unimpaired Muscle strength & Tone: No palpable anomalies  Lumbar Spine Area Exam  Skin & Axial Inspection: Well healed scar from previous spine surgery detected Alignment: Symmetrical Functional ROM: Decreased ROM specifically with lumbar  extension       Stability: No instability detected Muscle Tone/Strength: Increased muscle tone over affected area Sensory (Neurological): Musculoskeletal pain pattern Palpation: Complains of area being tender to palpation       Provocative Tests: Lumbar Hyperextension/rotation test: Positive bilaterally for facet joint pain. Lumbar quadrant test (Kemp's test): (+) bilaterally for facet joint pain. Lumbar Lateral bending test: deferred today       Patrick's Maneuver: deferred today                   FABER test: deferred today       Thigh-thrust test: deferred today       S-I compression test: deferred today       S-I distraction test: deferred today         Gait & Posture Assessment  Ambulation: Unassisted Gait: Relatively normal for age and body habitus Posture: WNL   Lower Extremity Exam    Side: Right lower extremity  Side: Left lower extremity  Stability: No instability observed          Stability: No instability observed          Skin & Extremity Inspection: Skin color, temperature, and hair growth are WNL. No peripheral edema or cyanosis. No masses, redness, swelling, asymmetry, or associated skin lesions. No contractures.  Skin & Extremity Inspection: Skin color, temperature, and hair growth are WNL. No peripheral edema or cyanosis. No masses, redness, swelling, asymmetry, or associated skin lesions. No contractures.  Functional ROM: Unrestricted ROM                  Functional ROM: Unrestricted ROM                  Muscle Tone/Strength: Functionally intact. No obvious neuro-muscular anomalies detected.  Muscle Tone/Strength: Functionally intact. No obvious  neuro-muscular anomalies detected.  Sensory (Neurological): Unimpaired  Sensory (Neurological): Unimpaired  Palpation: No palpable anomalies  Palpation: No palpable anomalies   Assessment  Primary Diagnosis & Pertinent Problem List: The primary encounter diagnosis was Failed back surgical syndrome. Diagnoses of History of  fusion of lumbar spine, Lumbar radiculopathy, Cervical fusion syndrome, Cervical radiculopathy, Cervical radiculopathy due to degenerative joint disease of spine, Chronic pain syndrome, and Therapeutic opioid-induced constipation (OIC) were also pertinent to this visit.  Status Diagnosis  Persistent Persistent Persistent 1. Failed back surgical syndrome   2. History of fusion of lumbar spine   3. Lumbar radiculopathy   4. Cervical fusion syndrome   5. Cervical radiculopathy   6. Cervical radiculopathy due to degenerative joint disease of spine   7. Chronic pain syndrome   8. Therapeutic opioid-induced constipation (OIC)      General Recommendations: The pain condition that the patient suffers from is best treated with a multidisciplinary approach that involves an increase in physical activity to prevent de-conditioning and worsening of the pain cycle, as well as psychological counseling (formal and/or informal) to address the co-morbid psychological affects of pain. Treatment will often involve judicious use of pain medications and interventional procedures to decrease the pain, allowing the patient to participate in the physical activity that will ultimately produce long-lasting pain reductions. The goal of the multidisciplinary approach is to return the patient to a higher level of overall function and to restore their ability to perform activities of daily living.  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 Patient symptoms are secondary to cervical radiculopathy, cervical fusion syndrome, failed back surgical syndrome, chronic lumbar radiculopathy. Patient has tried various medication including Lyrica, Effexor, Cymbalta, gabapentin,  nortriptyline, Flexeril, Robaxin. These medications had limited benefit or not effective in achieving effective pain control for the patient. She has had a series of 9 epidural steroid injections. She states that these were only helpful for approximately 3-4 weeks. She has been talked to about spinal cord stimulation but she states that we will not make her right foot drop any better. We did have a discussion about spinal cord stimulation and I believe that this therapy could be potentially beneficial and helping to reduce the patient's right lower extremity pain but not necessarily her foot drop symptoms.Patient wants to hold off at this time. Patient will need implant eval if should like to proceed with SCS trial which she may be candidate for.  CT cervical and lumbar spine performed 10/07/17. Cervical spine confirms ACDF at C5-C6 and C6-C7 with solid arthrodesis, C4-C5 subtle retrolisthesis with moderate facet arthropathy, left greater than right. Regards to her lumbar spine, patient has transitional lumbosacral anatomy with a history of a posterior interbody fusion at L4-L5 with no hardware loosening and questionable developing posterior element arthrodesis on the left. She does have mild adjacent segment disease at L3-4 with only mild L3 neuroforaminal stenosis. She does have adjacent segment disease at L5-S1 with severe facet arthropathy great on the left. This results in a bilateral vacuum effect.  Also in regards to interventional therapies, I discussed caudal ESI for her chronic lumbosacral radiculopathy alongbilateral L5-S1 facet medial branch nerve block for L5-S1 severe facet arthropathy however patient wants to hold off.   Bowel habits improved. Patient  here for med refill. Is usually taking 1-2 tablets of Hydrocodone daily and occasionally will take 3rd one on painful days. Asked if we could reduce to 60/month but patient states that she sometimes need extra dose at night if having  bad pain. Reasonable to continue at current dose.  Plan of Care  Pharmacotherapy (Medications Ordered): Meds ordered this encounter  Medications  . DISCONTD: HYDROcodone-acetaminophen (NORCO/VICODIN) 5-325 MG tablet    Sig: Take 1 tablet by mouth 3 (three) times daily as needed for moderate pain or severe pain. For chronic pain    Dispense:  90 tablet    Refill:  0    Do not place this medication, or any other prescription from our practice, on "Automatic Refill". Patient may have prescription filled one day early if pharmacy is closed on scheduled refill date.  Marland Kitchen DISCONTD: HYDROcodone-acetaminophen (NORCO/VICODIN) 5-325 MG tablet    Sig: Take 1 tablet by mouth 3 (three) times daily as needed for moderate pain.    Dispense:  90 tablet    Refill:  0    Do not place this medication, or any other prescription from our practice, on "Automatic Refill". Patient may have prescription filled one day early if pharmacy is closed on scheduled refill date.  Marland Kitchen HYDROcodone-acetaminophen (NORCO/VICODIN) 5-325 MG tablet    Sig: Take 1 tablet by mouth 3 (three) times daily as needed for moderate pain.    Dispense:  90 tablet    Refill:  0    Do not place this medication, or any other prescription from our practice, on "Automatic Refill". Patient may have prescription filled one day early if pharmacy is closed on scheduled refill date.  Marland Kitchen HYDROcodone-acetaminophen (NORCO/VICODIN) 5-325 MG tablet    Sig: Take 1 tablet by mouth 3 (three) times daily as needed for moderate pain or severe pain. For chronic pain    Dispense:  90 tablet    Refill:  0    Do not place this medication, or any other prescription from our practice, on "Automatic Refill". Patient may have prescription filled one day early if pharmacy is closed on scheduled refill date.   Time Note: Greater than 50% of the 25 minute(s) of face-to-face time spent with Ms. Lasater, was spent in counseling/coordination of care regarding: Ms. Luster primary  cause of pain, the treatment plan, treatment alternatives, the risks and possible complications of proposed treatment, medication side effects, the opioid analgesic risks and possible complications, the appropriate use of her medications, realistic expectations, the goals of pain management (increased in functionality), the medication agreement and the patient's responsibilities when it comes to controlled substances. Provider-requested follow-up: Return in about 8 weeks (around 05/25/2018) for MM with Crystal.  Future Appointments  Date Time Provider [place]  05/25/2018  8:30 AM Vevelyn Francois, NP [facility] None    Primary Care Physician: Idelle Crouch, MD Location: [facility] Note by: Gillis Santa, M.D Date: 03/30/2018; Time: 3:56 PM  Patient Instructions  Hydrocodone with acetaminophen to last until 06/06/18 has been escribed to your pharmacy.

## 2018-04-18 ENCOUNTER — Other Ambulatory Visit: Payer: Self-pay

## 2018-04-18 ENCOUNTER — Encounter: Payer: Self-pay | Admitting: Emergency Medicine

## 2018-04-18 ENCOUNTER — Emergency Department
Admission: EM | Admit: 2018-04-18 | Discharge: 2018-04-18 | Disposition: A | Discharge status: Home / Self Care | Payer: 59 | Attending: Emergency Medicine | Admitting: Emergency Medicine

## 2018-04-18 DIAGNOSIS — Z87891 Personal history of nicotine dependence: Secondary | ICD-10-CM | POA: Insufficient documentation

## 2018-04-18 DIAGNOSIS — I10 Essential (primary) hypertension: Secondary | ICD-10-CM | POA: Diagnosis not present

## 2018-04-18 DIAGNOSIS — L989 Disorder of the skin and subcutaneous tissue, unspecified: Secondary | ICD-10-CM

## 2018-04-18 DIAGNOSIS — Z79899 Other long term (current) drug therapy: Secondary | ICD-10-CM | POA: Diagnosis not present

## 2018-04-18 NOTE — ED Provider Notes (Signed)
[facility] Emergency Department Provider Note  ____________________________________________   First MD Initiated Contact with Patient 04/18/18 [phone]     (approximate)  I have reviewed the triage vital signs and the nursing notes.   HISTORY  Chief Complaint Tick Removal    HPI Teresa Rhodes is a 47 y.o. female presents emergency department with concerns of a tick noted on the right labia.  She states she was washing and felt a small lump in this area was concerned that the tick because she was taking pictures and bending down in the grass this weekend.  She denies fever chills or rash.  She states her husband tried to remove it without any success.    Past Medical History:  Diagnosis Date  . Complication of anesthesia    states she woke up during  neck surgery  . Depression   . Gallstones   . Hypertension   . Migraine   . Seizures ([place]) 2010-2013   Brain seizures    Patient Active Problem List   Diagnosis Date Noted  . Nausea & vomiting 04/07/2017  . Seizure ([place]) 12/01/2016  . Deep vein thrombosis (DVT) of left lower extremity ([place]) 11/23/2016  . Leg swelling 11/23/2016  . Pinched nerve 11/23/2016  . Right leg pain 11/23/2016  . Difficulty sleeping 10/20/2016  . Headache disorder 10/20/2016  . Chronic idiopathic constipation 12/04/2015  . Degeneration of intervertebral disc of lumbosacral region 09/12/2015  . Headache, migraine 09/12/2015  . Current tobacco use 09/12/2015  . Back pain 09/11/2015  . Lumbosacral radiculopathy 07/22/2014  . White matter abnormality on MRI of brain 06/12/2014  . Foot drop 06/12/2014  . Right arm weakness 06/12/2014    Past Surgical History:  Procedure Laterality Date  . BACK SURGERY    . CHOLECYSTECTOMY  2002  . COLONOSCOPY WITH PROPOFOL N/A 04/10/2017   Procedure: COLONOSCOPY WITH PROPOFOL;  Surgeon: Jonathon Bellows, MD;  [facility] ENDOSCOPY;  Service: Gastroenterology;  Laterality: N/A;  .  ESOPHAGOGASTRODUODENOSCOPY (EGD) WITH PROPOFOL N/A 04/10/2017   Procedure: ESOPHAGOGASTRODUODENOSCOPY (EGD) WITH PROPOFOL;  Surgeon: Jonathon Bellows, MD;  Location: [facility] ENDOSCOPY;  Service: Gastroenterology;  Laterality: N/A;  . NECK SURGERY  2005 & 2009   x2    Prior to Admission medications   Medication Sig Start Date End Date Taking? Authorizing Provider  amphetamine-dextroamphetamine (ADDERALL) 10 MG tablet Take 10 mg by mouth 2 (two) times daily with a meal.    [provider]  azelastine (ASTELIN) 0.1 % nasal spray Place 1 spray into both nostrils daily. 09/13/17   [provider]  bisoprolol-hydrochlorothiazide (ZIAC) 5-6.25 MG tablet Take 1 tablet by mouth daily. 04/26/17   [provider]  diazepam (VALIUM) 5 MG tablet Take 5 mg by mouth every 12 (twelve) hours as needed for anxiety.     [provider]  losartan-hydrochlorothiazide (HYZAAR) 100-25 MG tablet Take 1 tablet by mouth daily.    [provider]  SUMAtriptan (IMITREX) 100 MG tablet Take 1/2-1 tab at headache onset, may repeat once if needed in 2 hours.  No more than 2 in 24 hours. 01/26/17   [provider]    Allergies Robaxin [methocarbamol]  Family History  Problem Relation Age of Onset  . Ovarian cancer Mother   . Ulcerative colitis Mother   . Liver disease Maternal Grandfather   . Kidney disease Father 51  . Kidney disease Brother 20  . Colon cancer Neg Hx   . Colon polyps Neg Hx   .  Esophageal cancer Neg Hx   . Kidney cancer Neg Hx   . Bladder Cancer Neg Hx     Social History Social History   Tobacco Use  . Smoking status: Former Smoker    Packs/day: 0.50    Years: 3.00    Pack years: 1.50    Types: Cigarettes    Last attempt to quit: 10/24/2014    Years since quitting: 3.4  . Smokeless tobacco: Never Used  . Tobacco comment: pt given handout  Substance Use Topics  . Alcohol use: No    Alcohol/week: 0.0 standard drinks  . Drug use: No    Review  of Systems  Constitutional: No fever/chills Eyes: No visual changes. ENT: No sore throat. Respiratory: Denies cough Genitourinary: Negative for dysuria.  Questionable tic on the right labia Musculoskeletal: Negative for back pain. Skin: Negative for rash.    ____________________________________________   PHYSICAL EXAM:  VITAL SIGNS: ED Triage Vitals  Enc Vitals Group     BP 04/18/18 0822 (!) 156/98     Pulse Rate 04/18/18 0822 95     Resp 04/18/18 0822 18     Temp 04/18/18 0822 98.1 F (36.7 C)     Temp Source 04/18/18 0822 Oral     SpO2 04/18/18 0822 99 %     Weight 04/18/18 0815 210 lb (95.3 kg)     Height 04/18/18 0815 5\' 9"  (1.753 m)     Head Circumference --      Peak Flow --      Pain Score 04/18/18 0813 0     Pain Loc --      Pain Edu? --      Excl. in [place]? --     Constitutional: Alert and oriented. Well appearing and in no acute distress. Eyes: Conjunctivae are normal.  Head: Atraumatic. Nose: No congestion/rhinnorhea. Mouth/Throat: Mucous membranes are moist.   Neck:  supple no lymphadenopathy noted Cardiovascular: Normal rate, regular rhythm.  Respiratory: Normal respiratory effort.  No retractions GU: External exam of the labia show a small dark lesion on the inner aspect.  The area does not appear to be a tic.  It looks more like a blood blister.  The area was removed by forceps.  There is no remaining lesion noted. Musculoskeletal: FROM all extremities, warm and well perfused Neurologic:  Normal speech and language.  Skin:  Skin is warm, dry and intact. No rash noted. Psychiatric: Mood and affect are normal. Speech and behavior are normal.  ____________________________________________   LABS (all labs ordered are listed, but only abnormal results are displayed)  Labs Reviewed - No data to  display ____________________________________________   ____________________________________________  RADIOLOGY    ____________________________________________   PROCEDURES  Procedure(s) performed: No  Procedures    ____________________________________________   INITIAL IMPRESSION / ASSESSMENT AND PLAN / ED COURSE  Pertinent labs & imaging results that were available during my care of the patient were reviewed by me and considered in my medical decision making (see chart for details).   Patient is 47 year old female presents emergency department complaining of questionable tick on the right labia.  On physical exam the area has a dark questionable blood blister noted on the right labia but no tic is noted.  The blood blister was removed with forceps.  The area was friable and began to bleed.  Explained to the patient that if the area return she will need to see GYN and get a skin biopsy performed.  She states she understands will  comply with the treatment plan.  She was discharged in stable condition.     As part of my medical decision making, I reviewed the following data within the [place] notes reviewed and incorporated, Notes from prior ED visits and [place] Controlled Substance Database  ____________________________________________   FINAL CLINICAL IMPRESSION(S) / ED DIAGNOSES  Final diagnoses:  Skin lesion      NEW MEDICATIONS STARTED DURING THIS VISIT:  Discharge Medication List as of 04/18/2018  8:37 AM       Note:  This document was prepared using Dragon voice recognition software and may include unintentional dictation errors.    Versie Starks, PA-C 04/18/18 0930    Schuyler Amor, MD 04/18/18 [phone]

## 2018-04-18 NOTE — Discharge Instructions (Addendum)
Follow up with your regular doctor if not better in 3 days, if the skin lesion reappears you may want to have the area biopsied.  If you develop fever/chills/rash you will need to be rechecked in case you did sustain a tick bite

## 2018-04-18 NOTE — ED Triage Notes (Signed)
States she thinks she has a tick in vaginal area

## 2018-04-18 NOTE — ED Notes (Signed)
States she was taking pictures at a wedding this past weekend  States it was near a pond in a field  Was on her knees for a few shots

## 2018-05-03 DIAGNOSIS — I10 Essential (primary) hypertension: Secondary | ICD-10-CM | POA: Diagnosis not present

## 2018-05-03 DIAGNOSIS — G43809 Other migraine, not intractable, without status migrainosus: Secondary | ICD-10-CM | POA: Diagnosis not present

## 2018-05-03 DIAGNOSIS — Z79899 Other long term (current) drug therapy: Secondary | ICD-10-CM | POA: Diagnosis not present

## 2018-05-19 DIAGNOSIS — K1379 Other lesions of oral mucosa: Secondary | ICD-10-CM | POA: Diagnosis not present

## 2018-05-25 ENCOUNTER — Ambulatory Visit: Payer: 59 | Attending: Nurse Practitioner | Admitting: Nurse Practitioner

## 2018-05-25 ENCOUNTER — Encounter: Payer: Self-pay | Admitting: Nurse Practitioner

## 2018-05-25 ENCOUNTER — Other Ambulatory Visit: Payer: Self-pay

## 2018-05-25 VITALS — BP 118/86 | HR 76 | Temp 98.0°F | Ht 69.0 in | Wt 210.0 lb

## 2018-05-25 DIAGNOSIS — M5117 Intervertebral disc disorders with radiculopathy, lumbosacral region: Secondary | ICD-10-CM | POA: Insufficient documentation

## 2018-05-25 DIAGNOSIS — M542 Cervicalgia: Secondary | ICD-10-CM

## 2018-05-25 DIAGNOSIS — Z79891 Long term (current) use of opiate analgesic: Secondary | ICD-10-CM | POA: Diagnosis not present

## 2018-05-25 DIAGNOSIS — M79605 Pain in left leg: Secondary | ICD-10-CM

## 2018-05-25 DIAGNOSIS — M5441 Lumbago with sciatica, right side: Secondary | ICD-10-CM

## 2018-05-25 DIAGNOSIS — M79604 Pain in right leg: Secondary | ICD-10-CM

## 2018-05-25 DIAGNOSIS — M21371 Foot drop, right foot: Secondary | ICD-10-CM | POA: Insufficient documentation

## 2018-05-25 DIAGNOSIS — Z86718 Personal history of other venous thrombosis and embolism: Secondary | ICD-10-CM | POA: Diagnosis not present

## 2018-05-25 DIAGNOSIS — M6281 Muscle weakness (generalized): Secondary | ICD-10-CM | POA: Diagnosis not present

## 2018-05-25 DIAGNOSIS — G894 Chronic pain syndrome: Secondary | ICD-10-CM

## 2018-05-25 DIAGNOSIS — Z720 Tobacco use: Secondary | ICD-10-CM | POA: Insufficient documentation

## 2018-05-25 DIAGNOSIS — G8929 Other chronic pain: Secondary | ICD-10-CM | POA: Insufficient documentation

## 2018-05-25 DIAGNOSIS — R569 Unspecified convulsions: Secondary | ICD-10-CM | POA: Diagnosis not present

## 2018-05-25 DIAGNOSIS — Z5181 Encounter for therapeutic drug level monitoring: Secondary | ICD-10-CM | POA: Insufficient documentation

## 2018-05-25 DIAGNOSIS — M5442 Lumbago with sciatica, left side: Secondary | ICD-10-CM

## 2018-05-25 DIAGNOSIS — M79601 Pain in right arm: Secondary | ICD-10-CM | POA: Diagnosis not present

## 2018-05-25 DIAGNOSIS — M79602 Pain in left arm: Secondary | ICD-10-CM

## 2018-05-25 DIAGNOSIS — M79603 Pain in arm, unspecified: Secondary | ICD-10-CM | POA: Insufficient documentation

## 2018-05-25 MED ORDER — HYDROCODONE-ACETAMINOPHEN 5-325 MG PO TABS: 1.0000 | ORAL_TABLET | Freq: Three times a day (TID) | ORAL | 0 refills | Status: DC | PRN

## 2018-05-25 NOTE — Progress Notes (Signed)
Patient's Name: Teresa Rhodes  MRN: [id number]  Referring Provider: Idelle Crouch, MD  DOB: 1971/02/25  PCP: Idelle Crouch, MD  DOS: 05/25/2018  Note by: Vevelyn Francois NP  Service setting: Ambulatory outpatient  Specialty: Interventional Pain Management  Location: [facility] (AMB) Pain Management Facility    Patient type: Established    Primary Reason(s) for Visit: Encounter for prescription drug management. (Level of risk: moderate)  CC: Neck Pain  HPI  Ms. Laureano is a 47 y.o. year old, female patient, who comes today for a medication management evaluation. She has White matter abnormality on MRI of brain; Foot drop; Right arm weakness; Lumbosacral radiculopathy; Back pain; Degeneration of intervertebral disc of lumbosacral region; Headache, migraine; Current tobacco use; Nausea & vomiting; Chronic idiopathic constipation; Deep vein thrombosis (DVT) of left lower extremity ([place]); Difficulty sleeping; Headache disorder; Leg swelling; Pinched nerve; Right leg pain; Seizure ([place]); Chronic neck pain; Chronic upper extremity pain; Chronic bilateral low back pain with bilateral sciatica; Chronic pain of both lower extremities; and Right foot drop on their problem list. Her primarily concern today is the Neck Pain  Pain Assessment: Location: Lower Neck Radiating: pain radiaties down to shoulder to right hand(hip leg , feet) Onset: More than a month ago Duration: Chronic pain Quality: Constant, Aching, Burning Severity: 3 /10 (subjective, self-reported pain score)  Note: Reported level is compatible with observation.                          Effect on ADL: limits my daily activites Timing: Constant Modifying factors: medications, heat, ice, rest BP: 118/86  HR: 76  Ms. Northrup was last scheduled for an appointment on Visit date not found for medication management. During today's appointment we reviewed Ms. Mickle's chronic pain status, as well as her outpatient medication regimen. She has  numbness and tingling. She has weakness. She admits that her pain is mostly consistent. The weather makes the pain worse.   The patient  reports that she does not use drugs. Her body mass index is 31.01 kg/m.  Further details on both, my assessment(s), as well as the proposed treatment plan, please see below.  Controlled Substance Pharmacotherapy Assessment REMS (Risk Evaluation and Mitigation Strategy)  Analgesic:Hydrocodone 5 mg3 times daily as needed MME/day:66m/day. BChauncey Fischer RN  05/25/2018  8:35 AM  Sign at close encounter Nursing Pain Medication Assessment:  Safety precautions to be maintained throughout the outpatient stay will include: orient to surroundings, keep bed in low position, maintain call bell within reach at all times, provide assistance with transfer out of bed and ambulation.  Medication Inspection Compliance: Pill count conducted under aseptic conditions, in front of the patient. Neither the pills nor the bottle was removed from the patient's sight at any time. Once count was completed pills were immediately returned to the patient in their original bottle.  Medication: Hydrocodone/APAP Pill/Patch Count: 64 of 90 pills remain Pill/Patch Appearance: Markings consistent with prescribed medication Bottle Appearance: Standard pharmacy container. Clearly labeled. Filled Date: 159/ 28 / 2018 Last Medication intake:  Today   Pharmacokinetics: Liberation and absorption (onset of action): WNL Distribution (time to peak effect): WNL Metabolism and excretion (duration of action): WNL         Pharmacodynamics: Desired effects: Analgesia: Ms. PGravesreports >50% benefit. Functional ability: Patient reports that medication allows her to accomplish basic ADLs Clinically meaningful improvement in function (CMIF): Sustained CMIF goals met Perceived effectiveness: Described as relatively  effective, allowing for increase in activities of daily living (ADL) Undesirable  effects: Side-effects or Adverse reactions: None reported Monitoring: [place] PMP: Online review of the past 64-monthperiod conducted. Compliant with practice rules and regulations Last UDS on record: Summary  Date Value Ref Range Status  08/18/2017 FINAL  Final    Comment:    ==================================================================== TOXAS[facility]RE COMP DRUG ANALYSIS,UR ==================================================================== Test                             Result       Flag       Units Drug Present and Declared for Prescription Verification   Amphetamine                    530          EXPECTED   ng/mg creat    Amphetamine is available as a schedule II prescription drug.   Desmethyldiazepam              61           EXPECTED   ng/mg creat   Oxazepam                       198          EXPECTED   ng/mg creat   Temazepam                      66           EXPECTED   ng/mg creat    Desmethyldiazepam, oxazepam, and temazepam are expected    metabolites of diazepam. Desmethyldiazepam and oxazepam are also    expected metabolites of other drugs, including chlordiazepoxide,    prazepam, clorazepate, and halazepam. Oxazepam is an expected    metabolite of temazepam. Oxazepam and temazepam are also    available as scheduled prescription medications. Drug Absent but Declared for Prescription Verification   Tizanidine                     Not Detected UNEXPECTED    Tizanidine, as indicated in the declared medication list, is not    always detected even when used as directed. ==================================================================== Test                      Result    Flag   Units      Ref Range   Creatinine              158              mg/dL      >=20 ==================================================================== Declared Medications:  The flagging and interpretation on this report are based on the  following declared medications.  Unexpected results may arise  from  inaccuracies in the declared medications.  **Note: The testing scope of this panel includes these medications:  Amphetamine (Adderall)  Diazepam  **Note: The testing scope of this panel does not include small to  moderate amounts of these reported medications:  Tizanidine  **Note: The testing scope of this panel does not include following  reported medications:  Bisoprolol (Ziac)  Hydrochlorothiazide (Ziac)  Losartan (Losartan Potassium)  Omeprazole  Ranitidine  Sumatriptan ==================================================================== For clinical consultation, please call ([phone] ====================================================================    UDS interpretation: Compliant          Medication Assessment Form: Reviewed. Patient indicates being  compliant with therapy Treatment compliance: Compliant Risk Assessment Profile: Aberrant behavior: See prior evaluations. None observed or detected today Comorbid factors increasing risk of overdose: See prior notes. No additional risks detected today Opioid risk tool (ORT) (Total Score): 1 Personal History of Substance Abuse (SUD-Substance use disorder):  Alcohol: Negative  Illegal Drugs: Negative  Rx Drugs: Negative  ORT Risk Level calculation: Low Risk Risk of substance use disorder (SUD): Low Opioid Risk Tool - 05/25/18 0844      Family History of Substance Abuse   Alcohol  Negative    Illegal Drugs  Negative    Rx Drugs  Negative      Personal History of Substance Abuse   Alcohol  Negative    Illegal Drugs  Negative    Rx Drugs  Negative      Age   Age between 51-45 years   Yes      History of Preadolescent Sexual Abuse   History of Preadolescent Sexual Abuse  Negative or Female      Psychological Disease   Psychological Disease  Negative    Depression  Negative      Total Score   Opioid Risk Tool Scoring  1    Opioid Risk Interpretation  Low Risk      ORT Scoring interpretation table:   Score <3 = Low Risk for SUD  Score between 4-7 = Moderate Risk for SUD  Score >8 = High Risk for Opioid Abuse   Risk Mitigation Strategies:  Patient Counseling: Covered Patient-Prescriber Agreement (PPA): Present and active  Notification to other healthcare providers: Done  Pharmacologic Plan: No change in therapy, at this time.             Laboratory Chemistry  Inflammation Markers (CRP: Acute Phase) (ESR: Chronic Phase) Lab Results  Component Value Date   LATICACIDVEN 1.21 11/01/2014                         Rheumatology Markers No results found for: RF, ANA, LABURIC, URICUR, LYMEIGGIGMAB, LYMEABIGMQN, HLAB27                      Renal Function Markers Lab Results  Component Value Date   BUN 5 (L) 02/11/2018   CREATININE 0.53 02/11/2018   BCR 9 06/12/2014   GFRAA >60 02/11/2018   GFRNONAA >60 02/11/2018                             Hepatic Function Markers Lab Results  Component Value Date   AST 27 04/07/2017   ALT 30 04/07/2017   ALBUMIN 4.1 04/07/2017   ALKPHOS 90 04/07/2017   LIPASE 22 04/07/2017                        Electrolytes Lab Results  Component Value Date   NA 139 02/11/2018   K 3.3 (L) 02/11/2018   CL 100 02/11/2018   CALCIUM 9.8 02/11/2018                        Neuropathy Markers Lab Results  Component Value Date   VITAMINB12 390 04/16/2014   HIV Non Reactive 04/08/2017                        CNS Tests Lab Results  Component Value Date   COLORCSF  PINK (A) 11/11/2016   APPEARCSF HAZY (A) 11/11/2016   RBCCOUNTCSF 2,105 (H) 11/11/2016   WBCCSF 8 (H) 11/11/2016   POLYSCSF 0 11/11/2016   LYMPHSCSF 90 11/11/2016   EOSCSF 0 11/11/2016   PROTEINCSF 38 11/11/2016   GLUCCSF 66 11/11/2016   JCVIRUS Negative 11/11/2016   CSFOLI Comment 11/11/2016   IGGCSF 2.1 11/11/2016                        Bone Pathology Markers No results found for: [place], VD125OH2TOT, VV6160VP7, TG6269SW5, 25OHVITD1, 25OHVITD2, 25OHVITD3, TESTOFREE, TESTOSTERONE                        Coagulation Parameters Lab Results  Component Value Date   PLT 233 02/11/2018                        Cardiovascular Markers Lab Results  Component Value Date   TROPONINI <0.03 02/11/2018   HGB 14.4 02/11/2018   HCT 41.5 02/11/2018                         CA Markers No results found for: CEA, CA125, LABCA2                      Note: Lab results reviewed.  Recent Diagnostic Imaging Results  DG Chest 2 View CLINICAL DATA:  Chest pain.  EXAM: CHEST - 2 VIEW  COMPARISON:  February 03, 2017  FINDINGS: The heart size and mediastinal contours are within normal limits. Both lungs are clear. The visualized skeletal structures are unremarkable.  IMPRESSION: No active cardiopulmonary disease.  Electronically Signed   By: Dorise Bullion III M.D   On: 02/11/2018 21:07  Complexity Note: Imaging results reviewed. Results shared with Ms. Whistler, using Layman's terms.                         Meds   Current Outpatient Medications:  .  diazepam (VALIUM) 5 MG tablet, Take 5 mg by mouth every 12 (twelve) hours as needed for anxiety. , Disp: , Rfl:  .  HYDROcodone-acetaminophen (NORCO/VICODIN) 5-325 MG tablet, TAKE 1 TABLET BY MOUTH 3 (THREE) TIMES DAILY AS NEEDED FOR MODERATE PAIN. *DNF UNTIL 05/07/18*, Disp: , Rfl: 0 .  omeprazole (PRILOSEC) 40 MG capsule, Take by mouth daily., Disp: , Rfl: 3 .  omeprazole (PRILOSEC) 40 MG capsule, Take by mouth., Disp: , Rfl:  .  SUMAtriptan (IMITREX) 100 MG tablet, Take 1/2-1 tab at headache onset, may repeat once if needed in 2 hours.  No more than 2 in 24 hours., Disp: , Rfl:  .  Vitamin D, Ergocalciferol, (DRISDOL) 1.25 MG (50000 UT) CAPS capsule, TAKE ONE CAPSULE BY MOUTH ONE TIME PER WEEK, Disp: , Rfl: 11 .  [START ON 08/13/2018] HYDROcodone-acetaminophen (NORCO/VICODIN) 5-325 MG tablet, Take 1 tablet by mouth 3 (three) times daily as needed for moderate pain or severe pain., Disp: 90 tablet, Rfl: 0 .  [START ON 07/14/2018]  HYDROcodone-acetaminophen (NORCO/VICODIN) 5-325 MG tablet, Take 1 tablet by mouth 3 (three) times daily as needed for moderate pain., Disp: 90 tablet, Rfl: 0 .  [START ON 06/14/2018] HYDROcodone-acetaminophen (NORCO/VICODIN) 5-325 MG tablet, Take 1 tablet by mouth 3 (three) times daily as needed for moderate pain or severe pain., Disp: 90 tablet, Rfl: 0  ROS  Constitutional: Denies any fever or chills Gastrointestinal:  No reported hemesis, hematochezia, vomiting, or acute GI distress Musculoskeletal: Denies any acute onset joint swelling, redness, loss of ROM, or weakness Neurological: No reported episodes of acute onset apraxia, aphasia, dysarthria, agnosia, amnesia, paralysis, loss of coordination, or loss of consciousness  Allergies  Ms. Lamere is allergic to robaxin [methocarbamol].  PFSH  Drug: Ms. Mousseau  reports that she does not use drugs. Alcohol:  reports that she does not drink alcohol. Tobacco:  reports that she quit smoking about 3 years ago. Her smoking use included cigarettes. She has a 1.50 pack-year smoking history. She has never used smokeless tobacco. Medical:  has a past medical history of Complication of anesthesia, Depression, Gallstones, Hypertension, Migraine, and Seizures ([place]) (2010-2013). Surgical: Ms. Messing  has a past surgical history that includes Cholecystectomy (2002); Neck surgery (2005 & 2009); Back surgery; Esophagogastroduodenoscopy (egd) with propofol (N/A, 04/10/2017); and Colonoscopy with propofol (N/A, 04/10/2017). Family: family history includes Kidney disease (age of onset: 68) in her brother; Kidney disease (age of onset: 54) in her father; Liver disease in her maternal grandfather; Ovarian cancer in her mother; Ulcerative colitis in her mother.  Constitutional Exam  General appearance: Well nourished, well developed, and well hydrated. In no apparent acute distress Vitals:   05/25/18 0835  BP: 118/86  Pulse: 76  Temp: 98 F (36.7 C)  SpO2: 99%   Weight: 210 lb (95.3 kg)  Height: _0  (1.753 m)  Psych/Mental status: Alert, oriented x 3 (person, place, & time)       Eyes: PERLA Respiratory: No evidence of acute respiratory distress  Cervical Spine Area Exam  Skin & Axial Inspection: Well healed scar from previous spine surgery detected Alignment: Symmetrical Functional ROM: Unrestricted ROM      Stability: No instability detected Muscle Tone/Strength: Functionally intact. No obvious neuro-muscular anomalies detected. Sensory (Neurological): Unimpaired Palpation: Complains of area being tender to palpation              Upper Extremity (UE) Exam    Side: Right upper extremity  Side: Left upper extremity  Skin & Extremity Inspection: Skin color, temperature, and hair growth are WNL. No peripheral edema or cyanosis. No masses, redness, swelling, asymmetry, or associated skin lesions. No contractures.  Skin & Extremity Inspection: Skin color, temperature, and hair growth are WNL. No peripheral edema or cyanosis. No masses, redness, swelling, asymmetry, or associated skin lesions. No contractures.  Functional ROM: Unrestricted ROM          Functional ROM: Unrestricted ROM          Muscle Tone/Strength: Functionally intact. No obvious neuro-muscular anomalies detected.  Muscle Tone/Strength: Functionally intact. No obvious neuro-muscular anomalies detected.  Sensory (Neurological): Unimpaired          Sensory (Neurological): Unimpaired          Palpation: No palpable anomalies              Palpation: No palpable anomalies               Thoracic Spine Area Exam  Skin & Axial Inspection: No masses, redness, or swelling Alignment: Symmetrical Functional ROM: Unrestricted ROM Stability: No instability detected Muscle Tone/Strength: Functionally intact. No obvious neuro-muscular anomalies detected. Sensory (Neurological): Unimpaired Muscle strength & Tone: No palpable anomalies  Lumbar Spine Area Exam  Skin & Axial Inspection: Well  healed scar from previous spine surgery detected Alignment: Symmetrical Functional ROM: Unrestricted ROM       Stability: No instability detected Muscle Tone/Strength: Functionally intact. No obvious  neuro-muscular anomalies detected. Sensory (Neurological): Unimpaired Palpation: Complains of area being tender to palpation       Provocative Tests: Hyperextension/rotation test: Negative       Lumbar quadrant test (Kemp's test): deferred today       Lateral bending test: (-)       Patrick's Maneuver: deferred today                     Gait & Posture Assessment  Ambulation: Unassisted Gait: normal gait (foot drop)  Posture: WNL   Lower Extremity Exam    Side: Right lower extremity  Side: Left lower extremity  Stability: No instability observed          Stability: No instability observed          Skin & Extremity Inspection: brace wron  Skin & Extremity Inspection: Skin color, temperature, and hair growth are WNL. No peripheral edema or cyanosis. No masses, redness, swelling, asymmetry, or associated skin lesions. No contractures.  Functional ROM: Unrestricted ROM                  Functional ROM: Unrestricted ROM                  Muscle Tone/Strength: Functionally intact. No obvious neuro-muscular anomalies detected.  Muscle Tone/Strength: Functionally intact. No obvious neuro-muscular anomalies detected.  Sensory (Neurological): Unimpaired  Sensory (Neurological): Unimpaired  Palpation: No palpable anomalies  Palpation: No palpable anomalies   Assessment  Primary Diagnosis & Pertinent Problem List: The primary encounter diagnosis was Chronic neck pain. Diagnoses of Chronic pain of both upper extremities, Chronic bilateral low back pain with bilateral sciatica, Chronic pain of both lower extremities, Chronic pain syndrome, Long term prescription opiate use, and Right foot drop were also pertinent to this visit.  Status Diagnosis  Controlled Controlled Controlled 1. Chronic neck pain    2. Chronic pain of both upper extremities   3. Chronic bilateral low back pain with bilateral sciatica   4. Chronic pain of both lower extremities   5. Chronic pain syndrome   6. Long term prescription opiate use   7. Right foot drop     Problems updated and reviewed during this visit: Problem  Chronic Neck Pain  Chronic Upper Extremity Pain  Chronic Bilateral Low Back Pain With Bilateral Sciatica  Chronic Pain of Both Lower Extremities  Right Foot Drop   Prescription written for new foot brace    Plan of Care  Pharmacotherapy (Medications Ordered): Meds ordered this encounter  Medications  . HYDROcodone-acetaminophen (NORCO/VICODIN) 5-325 MG tablet    Sig: Take 1 tablet by mouth 3 (three) times daily as needed for moderate pain or severe pain.    Dispense:  90 tablet    Refill:  0    Do not place this medication, or any other prescription from our practice, on "Automatic Refill". Patient may have prescription filled one day early if pharmacy is closed on scheduled refill date.    Order Specific Question:   Supervising Provider    Answer:   Milinda Pointer [phone]  . HYDROcodone-acetaminophen (NORCO/VICODIN) 5-325 MG tablet    Sig: Take 1 tablet by mouth 3 (three) times daily as needed for moderate pain.    Dispense:  90 tablet    Refill:  0    Do not place this medication, or any other prescription from our practice, on "Automatic Refill". Patient may have prescription filled one day early if pharmacy is closed on scheduled  refill date.    Order Specific Question:   Supervising Provider    Answer:   Milinda Pointer [phone]  . HYDROcodone-acetaminophen (NORCO/VICODIN) 5-325 MG tablet    Sig: Take 1 tablet by mouth 3 (three) times daily as needed for moderate pain or severe pain.    Dispense:  90 tablet    Refill:  0    Do not place this medication, or any other prescription from our practice, on "Automatic Refill". Patient may have prescription filled one day early if  pharmacy is closed on scheduled refill date.    Order Specific Question:   Supervising Provider    Answer:   Milinda Pointer [[id number]]   New Prescriptions   No medications on file   Medications administered today: [name] had no medications administered during this visit. Lab-work, procedure(s), and/or referral(s): Orders Placed This Encounter  Procedures  . ToxASSURE Select 13 (MW), Urine   Imaging and/or referral(s): None  Interventional therapies: Planned, scheduled, and/or pending:   Not at this time.    Provider-requested follow-up: Return in about 3 months (around 08/25/2018) for MedMgmt.  Future Appointments  Date Time Provider [place]  08/23/2018  8:30 AM Vevelyn Francois, NP [facility] None   Primary Care Physician: Idelle Crouch, MD Location: [facility] Out[facility] Management Facility Note by: Vevelyn Francois NP Date: 05/25/2018; Time: 9:47 AM  Pain Score Disclaimer: We use the NRS-11 scale. This is a self-reported, subjective measurement of pain severity with only modest accuracy. It is used primarily to identify changes within a particular patient. It must be understood that outpatient pain scales are significantly less accurate that those used for research, where they can be applied under ideal controlled circumstances with minimal exposure to variables. In reality, the score is likely to be a combination of pain intensity and pain affect, where pain affect describes the degree of emotional arousal or changes in action readiness caused by the sensory experience of pain. Factors such as social and work situation, setting, emotional state, anxiety levels, expectation, and prior pain experience may influence pain perception and show large inter-individual differences that may also be affected by time variables.  Patient instructions provided during this appointment: Patient Instructions    ____________________________________________________________________________________________  Medication Rules  Applies to: All patients receiving prescriptions (written or electronic).  Pharmacy of record: Pharmacy where electronic prescriptions will be sent. If written prescriptions are taken to a different pharmacy, please inform the nursing staff. The pharmacy listed in the electronic medical record should be the one where you would like electronic prescriptions to be sent.  Prescription refills: Only during scheduled appointments. Applies to both, written and electronic prescriptions.  NOTE: The following applies primarily to controlled substances (Opioid* Pain Medications).   Patient's responsibilities: 1. Pain Pills: Bring all pain pills to every appointment (except for procedure appointments). 2. Pill Bottles: Bring pills in original pharmacy bottle. Always bring newest bottle. Bring bottle, even if empty. 3. Medication refills: You are responsible for knowing and keeping track of what medications you need refilled. The day before your appointment, write a list of all prescriptions that need to be refilled. Bring that list to your appointment and give it to the admitting nurse. Prescriptions will be written only during appointments. If you forget a medication, it will not be "Called in", "Faxed", or "electronically sent". You will need to get another appointment to get these prescribed. 4. Prescription Accuracy: You are responsible for carefully inspecting your prescriptions before leaving our office.  Have the discharge nurse carefully go over each prescription with you, before taking them home. Make sure that your name is accurately spelled, that your address is correct. Check the name and dose of your medication to make sure it is accurate. Check the number of pills, and the written instructions to make sure they are clear and accurate. Make sure that you are given enough medication to  last until your next medication refill appointment. 5. Taking Medication: Take medication as prescribed. Never take more pills than instructed. Never take medication more frequently than prescribed. Taking less pills or less frequently is permitted and encouraged, when it comes to controlled substances (written prescriptions).  6. Inform other Doctors: Always inform, all of your healthcare providers, of all the medications you take. 7. Pain Medication from other Providers: You are not allowed to accept any additional pain medication from any other Doctor or Healthcare provider. There are two exceptions to this rule. (see below) In the event that you require additional pain medication, you are responsible for notifying us, as stated below. 8. Medication Agreement: You are responsible for carefully reading and following our Medication Agreement. This must be signed before receiving any prescriptions from our practice. Safely store a copy of your signed Agreement. Violations to the Agreement will result in no further prescriptions. (Additional copies of our Medication Agreement are available upon request.) 9. Laws, Rules, & Regulations: All patients are expected to follow all Federal and [employer], [employer], Rules, [employer]. Ignorance of the Laws does not constitute a valid excuse. The use of any illegal substances is prohibited. 10. Adopted CDC guidelines & recommendations: Target dosing levels will be at or below 60 MME/day. Use of benzodiazepines** is not recommended.  Exceptions: There are only two exceptions to the rule of not receiving pain medications from other Healthcare Providers. 1. Exception #1 (Emergencies): In the event of an emergency (i.e.: accident requiring emergency care), you are allowed to receive additional pain medication. However, you are responsible for: As soon as you are able, call our office [phone], at any time of the day or night, and leave a message stating your  name, the date and nature of the emergency, and the name and dose of the medication prescribed. In the event that your call is answered by a member of our staff, make sure to document and save the date, time, and the name of the person that took your information.  2. Exception #2 (Planned Surgery): In the event that you are scheduled by another doctor or dentist to have any type of surgery or procedure, you are allowed (for a period no longer than 30 days), to receive additional pain medication, for the acute post-op pain. However, in this case, you are responsible for picking up a copy of our "Post-op Pain Management for Surgeons" handout, and giving it to your surgeon or dentist. This document is available at our office, and does not require an appointment to obtain it. Simply go to our office during business hours (Monday-Thursday from 8:00 AM to 4:00 PM) (Friday 8:00 AM to 12:00 Noon) or if you have a scheduled appointment with [place], prior to your surgery, and ask for it by name. In addition, you will need to provide [place] with your name, name of your surgeon, type of surgery, and date of procedure or surgery.  *Opioid medications include: morphine, codeine, oxycodone, oxymorphone, hydrocodone, hydromorphone, meperidine, tramadol, tapentadol, buprenorphine, fentanyl, methadone. **Benzodiazepine medications include: diazepam (Valium), alprazolam (Xanax), clonazepam (Klonopine), lorazepam (Ativan),  clorazepate (Tranxene), chlordiazepoxide (Librium), estazolam (Prosom), oxazepam (Serax), temazepam (Restoril), triazolam (Halcion) (Last updated: 09/15/2017) ____________________________________________________________________________________________   BMI Assessment: Estimated body mass index is 31.01 kg/m as calculated from the following:   Height as of this encounter: _0  (1.753 m).   Weight as of this encounter: 210 lb (95.3 kg).  BMI interpretation table: BMI level Category Range association with  higher incidence of chronic pain  <18 kg/m2 Underweight   18.5-24.9 kg/m2 Ideal body weight   25-29.9 kg/m2 Overweight Increased incidence by 20%  30-34.9 kg/m2 Obese (Class I) Increased incidence by 68%  35-39.9 kg/m2 Severe obesity (Class II) Increased incidence by 136%  >40 kg/m2 Extreme obesity (Class III) Increased incidence by 254%   Patient's current BMI Ideal Body weight  Body mass index is 31.01 kg/m. Ideal body weight: 66.2 kg (145 lb 15.1 oz) Adjusted ideal body weight: 77.8 kg (171 lb 9.1 oz)   BMI Readings from Last 4 Encounters:  05/25/18 31.01 kg/m  04/18/18 31.01 kg/m  03/30/18 31.01 kg/m  02/11/18 31.75 kg/m   Wt Readings from Last 4 Encounters:  05/25/18 210 lb (95.3 kg)  04/18/18 210 lb (95.3 kg)  03/30/18 210 lb (95.3 kg)  02/11/18 215 lb (97.5 kg)

## 2018-05-25 NOTE — Progress Notes (Signed)
Nursing Pain Medication Assessment:  Safety precautions to be maintained throughout the outpatient stay will include: orient to surroundings, keep bed in low position, maintain call bell within reach at all times, provide assistance with transfer out of bed and ambulation.  Medication Inspection Compliance: Pill count conducted under aseptic conditions, in front of the patient. Neither the pills nor the bottle was removed from the patient's sight at any time. Once count was completed pills were immediately returned to the patient in their original bottle.  Medication: Hydrocodone/APAP Pill/Patch Count: 64 of 90 pills remain Pill/Patch Appearance: Markings consistent with prescribed medication Bottle Appearance: Standard pharmacy container. Clearly labeled. Filled Date: 50 / 28 / 2018 Last Medication intake:  Today

## 2018-05-25 NOTE — Patient Instructions (Addendum)
____________________________________________________________________________________________  Medication Rules  Applies to: All patients receiving prescriptions (written or electronic).  Pharmacy of record: Pharmacy where electronic prescriptions will be sent. If written prescriptions are taken to a different pharmacy, please inform the nursing staff. The pharmacy listed in the electronic medical record should be the one where you would like electronic prescriptions to be sent.  Prescription refills: Only during scheduled appointments. Applies to both, written and electronic prescriptions.  NOTE: The following applies primarily to controlled substances (Opioid* Pain Medications).   Patient's responsibilities: 1. Pain Pills: Bring all pain pills to every appointment (except for procedure appointments). 2. Pill Bottles: Bring pills in original pharmacy bottle. Always bring newest bottle. Bring bottle, even if empty. 3. Medication refills: You are responsible for knowing and keeping track of what medications you need refilled. The day before your appointment, write a list of all prescriptions that need to be refilled. Bring that list to your appointment and give it to the admitting nurse. Prescriptions will be written only during appointments. If you forget a medication, it will not be "Called in", "Faxed", or "electronically sent". You will need to get another appointment to get these prescribed. 4. Prescription Accuracy: You are responsible for carefully inspecting your prescriptions before leaving our office. Have the discharge nurse carefully go over each prescription with you, before taking them home. Make sure that your name is accurately spelled, that your address is correct. Check the name and dose of your medication to make sure it is accurate. Check the number of pills, and the written instructions to make sure they are clear and accurate. Make sure that you are given enough medication to last  until your next medication refill appointment. 5. Taking Medication: Take medication as prescribed. Never take more pills than instructed. Never take medication more frequently than prescribed. Taking less pills or less frequently is permitted and encouraged, when it comes to controlled substances (written prescriptions).  6. Inform other Doctors: Always inform, all of your healthcare providers, of all the medications you take. 7. Pain Medication from other Providers: You are not allowed to accept any additional pain medication from any other Doctor or Healthcare provider. There are two exceptions to this rule. (see below) In the event that you require additional pain medication, you are responsible for notifying us, as stated below. 8. Medication Agreement: You are responsible for carefully reading and following our Medication Agreement. This must be signed before receiving any prescriptions from our practice. Safely store a copy of your signed Agreement. Violations to the Agreement will result in no further prescriptions. (Additional copies of our Medication Agreement are available upon request.) 9. Laws, Rules, & Regulations: All patients are expected to follow all Federal and State Laws, Statutes, Rules, & Regulations. Ignorance of the Laws does not constitute a valid excuse. The use of any illegal substances is prohibited. 10. Adopted CDC guidelines & recommendations: Target dosing levels will be at or below 60 MME/day. Use of benzodiazepines** is not recommended.  Exceptions: There are only two exceptions to the rule of not receiving pain medications from other Healthcare Providers. 1. Exception #1 (Emergencies): In the event of an emergency (i.e.: accident requiring emergency care), you are allowed to receive additional pain medication. However, you are responsible for: As soon as you are able, call our office [phone], at any time of the day or night, and leave a message stating your name, the  date and nature of the emergency, and the name and dose of the medication   prescribed. In the event that your call is answered by a member of our staff, make sure to document and save the date, time, and the name of the person that took your information.  2. Exception #2 (Planned Surgery): In the event that you are scheduled by another doctor or dentist to have any type of surgery or procedure, you are allowed (for a period no longer than 30 days), to receive additional pain medication, for the acute post-op pain. However, in this case, you are responsible for picking up a copy of our "Post-op Pain Management for Surgeons" handout, and giving it to your surgeon or dentist. This document is available at our office, and does not require an appointment to obtain it. Simply go to our office during business hours (Monday-Thursday from 8:00 AM to 4:00 PM) (Friday 8:00 AM to 12:00 Noon) or if you have a scheduled appointment with [place], prior to your surgery, and ask for it by name. In addition, you will need to provide [place] with your name, name of your surgeon, type of surgery, and date of procedure or surgery.  *Opioid medications include: morphine, codeine, oxycodone, oxymorphone, hydrocodone, hydromorphone, meperidine, tramadol, tapentadol, buprenorphine, fentanyl, methadone. **Benzodiazepine medications include: diazepam (Valium), alprazolam (Xanax), clonazepam (Klonopine), lorazepam (Ativan), clorazepate (Tranxene), chlordiazepoxide (Librium), estazolam (Prosom), oxazepam (Serax), temazepam (Restoril), triazolam (Halcion) (Last updated: 09/15/2017) ____________________________________________________________________________________________   BMI Assessment: Estimated body mass index is 31.01 kg/m as calculated from the following:   Height as of this encounter: 5\' 9"  (1.753 m).   Weight as of this encounter: 210 lb (95.3 kg).  BMI interpretation table: BMI level Category Range association with higher  incidence of chronic pain  <18 kg/m2 Underweight   18.5-24.9 kg/m2 Ideal body weight   25-29.9 kg/m2 Overweight Increased incidence by 20%  30-34.9 kg/m2 Obese (Class I) Increased incidence by 68%  35-39.9 kg/m2 Severe obesity (Class II) Increased incidence by 136%  >40 kg/m2 Extreme obesity (Class III) Increased incidence by 254%   Patient's current BMI Ideal Body weight  Body mass index is 31.01 kg/m. Ideal body weight: 66.2 kg (145 lb 15.1 oz) Adjusted ideal body weight: 77.8 kg (171 lb 9.1 oz)   BMI Readings from Last 4 Encounters:  05/25/18 31.01 kg/m  04/18/18 31.01 kg/m  03/30/18 31.01 kg/m  02/11/18 31.75 kg/m   Wt Readings from Last 4 Encounters:  05/25/18 210 lb (95.3 kg)  04/18/18 210 lb (95.3 kg)  03/30/18 210 lb (95.3 kg)  02/11/18 215 lb (97.5 kg)

## 2018-05-30 LAB — TOXASSURE SELECT 13 (MW), URINE

## 2018-06-12 ENCOUNTER — Ambulatory Visit: Payer: No Typology Code available for payment source | Admitting: Obstetrics and Gynecology

## 2018-06-21 ENCOUNTER — Encounter: Payer: Self-pay | Admitting: Obstetrics and Gynecology

## 2018-06-21 ENCOUNTER — Other Ambulatory Visit (HOSPITAL_COMMUNITY)
Admission: RE | Admit: 2018-06-21 | Discharge: 2018-06-21 | Disposition: A | Discharge status: Home / Self Care | Payer: 59 | Source: Ambulatory Visit | Attending: Obstetrics and Gynecology | Admitting: Obstetrics and Gynecology

## 2018-06-21 ENCOUNTER — Ambulatory Visit (INDEPENDENT_AMBULATORY_CARE_PROVIDER_SITE_OTHER): Payer: 59 | Admitting: Obstetrics and Gynecology

## 2018-06-21 VITALS — BP 124/74 | Ht 68.0 in | Wt 214.0 lb

## 2018-06-21 DIAGNOSIS — Z01419 Encounter for gynecological examination (general) (routine) without abnormal findings: Secondary | ICD-10-CM | POA: Diagnosis not present

## 2018-06-21 DIAGNOSIS — Z1331 Encounter for screening for depression: Secondary | ICD-10-CM

## 2018-06-21 DIAGNOSIS — N951 Menopausal and female climacteric states: Secondary | ICD-10-CM | POA: Diagnosis not present

## 2018-06-21 DIAGNOSIS — Z124 Encounter for screening for malignant neoplasm of cervix: Secondary | ICD-10-CM | POA: Diagnosis present

## 2018-06-21 DIAGNOSIS — Z1339 Encounter for screening examination for other mental health and behavioral disorders: Secondary | ICD-10-CM

## 2018-06-21 NOTE — Progress Notes (Signed)
Gynecology Annual Exam  PCP: Idelle Crouch, MD  Chief Complaint  Patient presents with  . Annual Exam   History of Present Illness:  Ms. Teresa Rhodes is a 47 y.o. G2P1011 who LMP was No LMP recorded. Patient has had an ablation., presents today for her annual examination.  She had an endometrial ablation about 15 years ago.  It has been working well for a while.  About 8 months ago her menses began again. She has to use a panty liner for about a week. She skipped a menses in July. She had two menses in November.  She has to change a panty liner only once per day.    She does have vasomotor symptoms.  She notes some mood swings.  She has also had pain after sex the last couple of times she and her husband have had sex.  She has hot flushes quite frequently, though recently she has had less.  She thinks her hormones are "out of whack."   She also states that she has difficult sleeping, but this is a long-standing issue for which she has tried clonazepam and valium in the past and these worked. However, she does not want to take these again.   She is sexually active with a single partner, contraception - vasectomy. She does have vaginal dryness.  She has had to use lubrication and has had a decrease in libido.  Again, sex can be painful for her.   Last Pap: 05/2014  Results were: no abnormalities,  HPV DNA negative (positive in May 2015, but NILM cytology) Hx of STDs: none  Last mammogram: 2016  Results were: BiRads 3 (recommended f/u 1 year, which she did not complete) There is no FH of breast cancer. There is not a FH of ovarian cancer in her mother, though this is listed in her chart.  She states that when "they" went in, it wasn't malignant. The patient does not do self-breast exams.  Colonoscopy: 02/2017, showed hemorrhoids.     DEXA: has not been screened for osteoporosis  Tobacco use: former smoker, quit 2016. Alcohol use: none Exercise: moderately active  The patient wears  seatbelts: yes.     She state she has back issues for which she uses hydrocodone three times daily.  She also uses medication for ADHD.  Past Medical History:  Diagnosis Date  . Complication of anesthesia    states she woke up during  neck surgery  . Depression   . Gallstones   . Hypertension   . Migraine   . Seizures ([place]) 2010-2013   Brain seizures    Past Surgical History:  Procedure Laterality Date  . BACK SURGERY    . CHOLECYSTECTOMY  2002  . COLONOSCOPY WITH PROPOFOL N/A 04/10/2017   Procedure: COLONOSCOPY WITH PROPOFOL;  Surgeon: Jonathon Bellows, MD;  Location: [facility] ENDOSCOPY;  Service: Gastroenterology;  Laterality: N/A;  . ESOPHAGOGASTRODUODENOSCOPY (EGD) WITH PROPOFOL N/A 04/10/2017   Procedure: ESOPHAGOGASTRODUODENOSCOPY (EGD) WITH PROPOFOL;  Surgeon: Jonathon Bellows, MD;  Location[facility] ENDOSCOPY;  Service: Gastroenterology;  Laterality: N/A;  . NECK SURGERY  2005 & 2009   x2    Prior to Admission medications   Medication Sig Start Date End Date Taking? Authorizing Provider  amLODipine (NORVASC) 5 MG tablet Take 5 mg by mouth daily.   Yes [provider]  amphetamine-dextroamphetamine (ADDERALL) 20 MG tablet Take by mouth. 05/03/18  Yes [provider]  bisoprolol-hydrochlorothiazide (ZIAC) 5-6.25 MG tablet Take 1 tablet by mouth daily.  Yes [provider]  diazepam (VALIUM) 5 MG tablet Take 5 mg by mouth every 12 (twelve) hours as needed for anxiety.    Yes [provider]  HYDROcodone-acetaminophen (NORCO/VICODIN) 5-325 MG tablet TAKE 1 TABLET BY MOUTH 3 (THREE) TIMES DAILY AS NEEDED FOR MODERATE PAIN. *DNF UNTIL 05/07/18* 05/15/18  Yes [provider]  omeprazole (PRILOSEC) 40 MG capsule Take by mouth daily. 05/03/18  Yes [provider]  SUMAtriptan (IMITREX) 100 MG tablet Take 1/2-1 tab at headache onset, may repeat once if needed in 2 hours.  No more than 2 in 24 hours. 01/26/17  Yes [provider]  Vitamin  D, Ergocalciferol, (DRISDOL) 1.25 MG (50000 UT) CAPS capsule TAKE ONE CAPSULE BY MOUTH ONE TIME PER WEEK 04/26/18  Yes [provider]  HYDROcodone-acetaminophen (NORCO/VICODIN) 5-325 MG tablet Take 1 tablet by mouth 3 (three) times daily as needed for moderate pain or severe pain. Patient not taking: Reported on 06/21/2018 08/13/18 09/12/18  Vevelyn Francois, NP  HYDROcodone-acetaminophen (NORCO/VICODIN) 5-325 MG tablet Take 1 tablet by mouth 3 (three) times daily as needed for moderate pain. Patient not taking: Reported on 06/21/2018 07/14/18 08/13/18  Vevelyn Francois, NP  HYDROcodone-acetaminophen (NORCO/VICODIN) 5-325 MG tablet Take 1 tablet by mouth 3 (three) times daily as needed for moderate pain or severe pain. Patient not taking: Reported on 06/21/2018 06/14/18 07/14/18  Vevelyn Francois, NP  omeprazole (PRILOSEC) 40 MG capsule Take by mouth. 05/03/18   [provider]    Allergies  Allergen Reactions  . Robaxin [Methocarbamol] Other (See Comments)    Made skin red and hot and itchy    Obstetric History: G2P1011,   [place] - SVD G2 2002 - SAB  Family History  Problem Relation Age of Onset  . Ovarian cancer Mother   . Ulcerative colitis Mother   . Liver disease Maternal Grandfather   . Kidney disease Father 12  . Kidney disease Brother 86  . Colon cancer Neg Hx   . Colon polyps Neg Hx   . Esophageal cancer Neg Hx   . Kidney cancer Neg Hx   . Bladder Cancer Neg Hx     Social History   Socioeconomic History  . Marital status: Married    Spouse name: Not on file  . Number of children: 1  . Years of education: Not on file  . Highest education level: Not on file  Occupational History  . Occupation: Civil engineer, contracting  Social Needs  . Financial resource strain: Not on file  . Food insecurity:    Worry: Not on file    Inability: Not on file  . Transportation needs:    Medical: Not on file    Non-medical: Not on file  Tobacco Use  . Smoking status: Former  Smoker    Packs/day: 0.50    Years: 3.00    Pack years: 1.50    Types: Cigarettes    Last attempt to quit: 10/24/2014    Years since quitting: 3.6  . Smokeless tobacco: Never Used  . Tobacco comment: pt given handout  Substance and Sexual Activity  . Alcohol use: No    Alcohol/week: 0.0 standard drinks  . Drug use: No  . Sexual activity: Yes    Birth control/protection: None  Lifestyle  . Physical activity:    Days per week: Not on file    Minutes per session: Not on file  . Stress: Not on file  Relationships  . Social connections:  Talks on phone: Not on file    Gets together: Not on file    Attends religious service: Not on file    Active member of club or organization: Not on file    Attends meetings of clubs or organizations: Not on file    Relationship status: Not on file  . Intimate partner violence:    Fear of current or ex partner: Not on file    Emotionally abused: Not on file    Physically abused: Not on file    Forced sexual activity: Not on file  Other Topics Concern  . Not on file  Social History Narrative  . Not on file    Review of Systems  Constitutional: Negative.   HENT: Negative.   Eyes: Negative.   Respiratory: Negative.   Cardiovascular: Positive for leg swelling. Negative for chest pain, palpitations, orthopnea, claudication and PND.  Gastrointestinal: Negative.   Genitourinary: Positive for frequency. Negative for dysuria, flank pain, hematuria and urgency.       +dyspareunia  Musculoskeletal: Negative.   Skin: Negative.   Neurological: Negative.   Endo/Heme/Allergies: Negative for environmental allergies and polydipsia. Bruises/bleeds easily.  Psychiatric/Behavioral: Negative.      Physical Exam BP 124/74   Ht 5\' 8"  (1.727 m)   Wt 214 lb (97.1 kg)   BMI 32.54 kg/m   Physical Exam  Constitutional: She is oriented to person, place, and time. She appears well-developed and well-nourished. No distress.  Genitourinary: Uterus normal.  Pelvic exam was performed with patient supine. There is no rash, tenderness, lesion or injury on the right labia. There is no rash, tenderness, lesion or injury on the left labia. No erythema, tenderness or bleeding in the vagina. No signs of injury around the vagina. No vaginal discharge found. Right adnexum does not display mass, does not display tenderness and does not display fullness. Left adnexum does not display mass, does not display tenderness and does not display fullness. Cervix does not exhibit motion tenderness, lesion, discharge or polyp.   Uterus is mobile and anteverted. Uterus is not enlarged, tender or exhibiting a mass.  HENT:  Head: Normocephalic and atraumatic.  Eyes: EOM are normal. No scleral icterus.  Neck: Normal range of motion. Neck supple. No thyromegaly present.  Cardiovascular: Normal rate and regular rhythm. Exam reveals no gallop and no friction rub.  No murmur heard. Pulmonary/Chest: Effort normal and breath sounds normal. No respiratory distress. She has no wheezes. She has no rales. Right breast exhibits no inverted nipple, no mass, no nipple discharge, no skin change and no tenderness. Left breast exhibits no inverted nipple, no mass, no nipple discharge, no skin change and no tenderness.  Abdominal: Soft. Bowel sounds are normal. She exhibits no distension and no mass. There is no tenderness. There is no rebound and no guarding.  Musculoskeletal: Normal range of motion. She exhibits no edema or tenderness.  Lymphadenopathy:    She has no cervical adenopathy.       Right: No inguinal adenopathy present.       Left: No inguinal adenopathy present.  Neurological: She is alert and oriented to person, place, and time. No cranial nerve deficit.  Skin: Skin is warm and dry. No rash noted. No erythema.  Psychiatric: She has a normal mood and affect. Her behavior is normal. Judgment normal.    Female chaperone present for pelvic and breast  portions of the physical  exam  Results: AUDIT Questionnaire (screen for alcoholism): 0 PHQ-9: 10  Assessment: 47  y.o. G64P1011 female here for routine gynecologic examination.  Plan: Problem List Items Addressed This Visit      Other   Climacteric    Other Visit Diagnoses    Women's annual routine gynecological examination    -  Primary   Relevant Orders   Cytology - PAP   Screening for depression       Screening for alcoholism       Pap smear for cervical cancer screening       Relevant Orders   Cytology - PAP      Screening: -- Blood pressure screen managed by PCP -- Colonoscopy - per PCP -- Mammogram - due. Patient to call [place] to arrange. She understands that it is her responsibility to arrange this. -- Weight screening: obese: discussed management options, including lifestyle, dietary, and exercise. -- Depression screening negative (PHQ-9) -- Nutrition: normal -- cholesterol screening: per PCP -- osteoporosis screening: not due -- tobacco screening: not using -- alcohol screening: AUDIT questionnaire indicates low-risk usage. -- family history of breast cancer screening: done. not at high risk. -- no evidence of domestic violence or intimate partner violence. -- STD screening: gonorrhea/chlamydia NAAT not collected per patient request. -- pap smear collected per ASCCP guidelines -- HPV vaccination series: not eligilbe  Climacteric: patient has multiple symptoms, likely consistent with climacteric period.  Discussed possible treatment options. She is NOT a candidate due to her history of DVT, hypertension, and migraines.  We discussed alternative treatment strategies.  She declined them all.  Discussed strategies for less painful intercourse, such as the use of lubricants, which she is already doing.  She does not want anything to help with intercourse.  She will continue to think about the alternative strategies we discussed and will let me know, if she changes her mind.  20 minutes spent  in face to face discussion with > 50% spent in counseling,management, and coordination of care of her climacteric.   Prentice Docker, MD 06/21/2018 6:03 PM

## 2018-06-23 LAB — CYTOLOGY - PAP
Diagnosis: NEGATIVE
HPV: NOT DETECTED

## 2018-07-05 DIAGNOSIS — Z79899 Other long term (current) drug therapy: Secondary | ICD-10-CM | POA: Diagnosis not present

## 2018-07-05 DIAGNOSIS — I10 Essential (primary) hypertension: Secondary | ICD-10-CM | POA: Diagnosis not present

## 2018-07-05 DIAGNOSIS — G43809 Other migraine, not intractable, without status migrainosus: Secondary | ICD-10-CM | POA: Diagnosis not present

## 2018-08-11 DIAGNOSIS — R21 Rash and other nonspecific skin eruption: Secondary | ICD-10-CM | POA: Diagnosis not present

## 2018-08-23 ENCOUNTER — Telehealth: Payer: Self-pay | Admitting: *Deleted

## 2018-08-23 ENCOUNTER — Ambulatory Visit: Payer: 59 | Attending: Nurse Practitioner | Admitting: Nurse Practitioner

## 2018-08-23 ENCOUNTER — Encounter: Payer: Self-pay | Admitting: Nurse Practitioner

## 2018-08-23 VITALS — BP 125/82 | HR 80 | Temp 98.2°F | Resp 16 | Ht 68.5 in | Wt 197.0 lb

## 2018-08-23 DIAGNOSIS — M5442 Lumbago with sciatica, left side: Secondary | ICD-10-CM | POA: Diagnosis not present

## 2018-08-23 DIAGNOSIS — M79605 Pain in left leg: Secondary | ICD-10-CM | POA: Insufficient documentation

## 2018-08-23 DIAGNOSIS — M542 Cervicalgia: Secondary | ICD-10-CM | POA: Diagnosis not present

## 2018-08-23 DIAGNOSIS — M79602 Pain in left arm: Secondary | ICD-10-CM | POA: Insufficient documentation

## 2018-08-23 DIAGNOSIS — M79601 Pain in right arm: Secondary | ICD-10-CM | POA: Diagnosis not present

## 2018-08-23 DIAGNOSIS — M21371 Foot drop, right foot: Secondary | ICD-10-CM | POA: Diagnosis present

## 2018-08-23 DIAGNOSIS — M5441 Lumbago with sciatica, right side: Secondary | ICD-10-CM | POA: Diagnosis not present

## 2018-08-23 DIAGNOSIS — M79604 Pain in right leg: Secondary | ICD-10-CM

## 2018-08-23 DIAGNOSIS — G894 Chronic pain syndrome: Secondary | ICD-10-CM

## 2018-08-23 DIAGNOSIS — G8929 Other chronic pain: Secondary | ICD-10-CM | POA: Insufficient documentation

## 2018-08-23 MED ORDER — HYDROCODONE-ACETAMINOPHEN 5-325 MG PO TABS: 1.0000 | ORAL_TABLET | Freq: Three times a day (TID) | ORAL | 0 refills | Status: DC | PRN

## 2018-08-23 NOTE — Telephone Encounter (Signed)
Called to [facility] [phone]  To cancel hydrocodone - apap 5-325 mg x 1 month from previous visit.  Pharmacist confirms that this Rx is deleted.

## 2018-08-23 NOTE — Patient Instructions (Addendum)
____________________________________________________________________________________________  Medication Rules  Purpose: To inform patients, and their family members, of our rules and regulations.  Applies to: All patients receiving prescriptions (written or electronic).  Pharmacy of record: Pharmacy where electronic prescriptions will be sent. If written prescriptions are taken to a different pharmacy, please inform the nursing staff. The pharmacy listed in the electronic medical record should be the one where you would like electronic prescriptions to be sent.  Electronic prescriptions: In compliance with the [place] Strengthen Opioid Misuse Prevention (STOP) Act of 2017 (Session Law 2017-74/H243), effective July 19, 2018, all controlled substances must be electronically prescribed. Calling prescriptions to the pharmacy will cease to exist.  Prescription refills: Only during scheduled appointments. Applies to all prescriptions.  NOTE: The following applies primarily to controlled substances (Opioid* Pain Medications).   Patient's responsibilities: 1. Pain Pills: Bring all pain pills to every appointment (except for procedure appointments). 2. Pill Bottles: Bring pills in original pharmacy bottle. Always bring the newest bottle. Bring bottle, even if empty. 3. Medication refills: You are responsible for knowing and keeping track of what medications you take and those you need refilled. The day before your appointment: write a list of all prescriptions that need to be refilled. The day of the appointment: give the list to the admitting nurse. Prescriptions will be written only during appointments. If you forget a medication: it will not be "Called in", "Faxed", or "electronically sent". You will need to get another appointment to get these prescribed. No early refills. Do not call asking to have your prescription filled early. 4. Prescription Accuracy: You are responsible for  carefully inspecting your prescriptions before leaving our office. Have the discharge nurse carefully go over each prescription with you, before taking them home. Make sure that your name is accurately spelled, that your address is correct. Check the name and dose of your medication to make sure it is accurate. Check the number of pills, and the written instructions to make sure they are clear and accurate. Make sure that you are given enough medication to last until your next medication refill appointment. 5. Taking Medication: Take medication as prescribed. When it comes to controlled substances, taking less pills or less frequently than prescribed is permitted and encouraged. Never take more pills than instructed. Never take medication more frequently than prescribed.  6. Inform other Doctors: Always inform, all of your healthcare providers, of all the medications you take. 7. Pain Medication from other Providers: You are not allowed to accept any additional pain medication from any other Doctor or Healthcare provider. There are two exceptions to this rule. (see below) In the event that you require additional pain medication, you are responsible for notifying us, as stated below. 8. Medication Agreement: You are responsible for carefully reading and following our Medication Agreement. This must be signed before receiving any prescriptions from our practice. Safely store a copy of your signed Agreement. Violations to the Agreement will result in no further prescriptions. (Additional copies of our Medication Agreement are available upon request.) 9. Laws, Rules, & Regulations: All patients are expected to follow all Federal and State Laws, Statutes, Rules, & Regulations. Ignorance of the Laws does not constitute a valid excuse. The use of any illegal substances is prohibited. 10. Adopted CDC guidelines & recommendations: Target dosing levels will be at or below 60 MME/day. Use of benzodiazepines** is not  recommended.  Exceptions: There are only two exceptions to the rule of not receiving pain medications from other Healthcare Providers. 1.   Exception #1 (Emergencies): In the event of an emergency (i.e.: accident requiring emergency care), you are allowed to receive additional pain medication. However, you are responsible for: As soon as you are able, call our office [phone], at any time of the day or night, and leave a message stating your name, the date and nature of the emergency, and the name and dose of the medication prescribed. In the event that your call is answered by a member of our staff, make sure to document and save the date, time, and the name of the person that took your information.  2. Exception #2 (Planned Surgery): In the event that you are scheduled by another doctor or dentist to have any type of surgery or procedure, you are allowed (for a period no longer than 30 days), to receive additional pain medication, for the acute post-op pain. However, in this case, you are responsible for picking up a copy of our "Post-op Pain Management for Surgeons" handout, and giving it to your surgeon or dentist. This document is available at our office, and does not require an appointment to obtain it. Simply go to our office during business hours (Monday-Thursday from 8:00 AM to 4:00 PM) (Friday 8:00 AM to 12:00 Noon) or if you have a scheduled appointment with [place], prior to your surgery, and ask for it by name. In addition, you will need to provide [place] with your name, name of your surgeon, type of surgery, and date of procedure or surgery.  *Opioid medications include: morphine, codeine, oxycodone, oxymorphone, hydrocodone, hydromorphone, meperidine, tramadol, tapentadol, buprenorphine, fentanyl, methadone. **Benzodiazepine medications include: diazepam (Valium), alprazolam (Xanax), clonazepam (Klonopine), lorazepam (Ativan), clorazepate (Tranxene), chlordiazepoxide (Librium), estazolam (Prosom),  oxazepam (Serax), temazepam (Restoril), triazolam (Halcion) (Last updated: 09/15/2017) ____________________________________________________________________________________________   Hydrocodone - apap 5-325 mg x 3 months escribed to [facility].  Fill dates: 08/23/18, 09/22/18, 10/22/18

## 2018-08-23 NOTE — Progress Notes (Signed)
Patient's Name: Teresa Rhodes  MRN: [id number]  Referring Provider: Idelle Crouch, MD  DOB: Sep 29, 1970  PCP: Idelle Crouch, MD  DOS: 08/23/2018  Note by: Vevelyn Francois NP  Service setting: Ambulatory outpatient  Specialty: Interventional Pain Management  Location: [facility] (AMB) Pain Management Facility    Patient type: Established    Primary Reason(s) for Visit: Encounter for prescription drug management. (Level of risk: moderate)  CC: Neck Pain (bilateral  s/p 2 neck surgeries); Back Pain (lumbar bilateral); Leg Pain (right); and Foot Pain (right)  HPI  Teresa Rhodes is a 48 y.o. year old, female patient, who comes today for a medication management evaluation. She has White matter abnormality on MRI of brain; Foot drop; Right arm weakness; Lumbosacral radiculopathy; Back pain; Degeneration of intervertebral disc of lumbosacral region; Headache, migraine; Current tobacco use; Nausea & vomiting; Chronic idiopathic constipation; Deep vein thrombosis (DVT) of left lower extremity ([place]); Difficulty sleeping; Headache disorder; Leg swelling; Pinched nerve; Right leg pain; Seizure ([place]); Chronic neck pain; Chronic upper extremity pain; Chronic bilateral low back pain with bilateral sciatica; Chronic pain of both lower extremities; Right foot drop; and Climacteric on their problem list. Her primarily concern today is the Neck Pain (bilateral  s/p 2 neck surgeries); Back Pain (lumbar bilateral); Leg Pain (right); and Foot Pain (right)  Pain Assessment: Location: Left, Right Neck(see visit info for additional pain sites. ) Radiating: neck pain into both shoulders and right arm, back pain into right leg  Onset: More than a month ago Duration: Chronic pain Quality: Discomfort, Dull, Aching, Pins and needles, Burning, Stabbing(electrical shocks.  sensations vary upon what is going on each day. ) Severity: 3 /10 (subjective, self-reported pain score)  Note: Reported level is compatible with observation.                           Effect on ADL: patient feels that pain in foot is so bad if she could chop it off she would and replace with new foot.  Timing: Intermittent Modifying factors: medication regimen BP: 125/82  HR: 80  Teresa Rhodes was last scheduled for an appointment on 05/25/2018 for medication management. During today's appointment we reviewed Teresa Rhodes's chronic pain status, as well as her outpatient medication regimen. She has numbness and tingling in her arms and legs. She has some weakness in her right  Arm. She has right foot drop.    The patient  reports no history of drug use. Her body mass index is 29.52 kg/m.  Further details on both, my assessment(s), as well as the proposed treatment plan, please see below.  Controlled Substance Pharmacotherapy Assessment REMS (Risk Evaluation and Mitigation Strategy)  Analgesic:Hydrocodone 5 mg3 times daily as needed MME/day:48m/day. [name] RN  08/23/2018  8:42 AM  Sign when Signing Visit Nursing Pain Medication Assessment:  Safety precautions to be maintained throughout the outpatient stay will include: orient to surroundings, keep bed in low position, maintain call bell within reach at all times, provide assistance with transfer out of bed and ambulation.  Medication Inspection Compliance: Pill count conducted under aseptic conditions, in front of the patient. Neither the pills nor the bottle was removed from the patient's sight at any time. Once count was completed pills were immediately returned to the patient in their original bottle.  Medication: Hydrocodone/APAP Pill/Patch Count: 6 of 90 pills remain Pill/Patch Appearance: Markings consistent with prescribed medication Bottle Appearance: Standard pharmacy container. Clearly labeled.  Filled Date: 01 / 04 / 2020 Last Medication intake:  Today   Pharmacokinetics: Liberation and absorption (onset of action): WNL Distribution (time to peak effect): WNL Metabolism and  excretion (duration of action): WNL         Pharmacodynamics: Desired effects: Analgesia: Teresa Rhodes reports >50% benefit. Functional ability: Patient reports that medication allows her to accomplish basic ADLs Clinically meaningful improvement in function (CMIF): Sustained CMIF goals met Perceived effectiveness: Described as relatively effective, allowing for increase in activities of daily living (ADL) Undesirable effects: Side-effects or Adverse reactions: Constipation She admits that this is not related to her use of Norco. She has GI issues.  Monitoring: [place] PMP: Online review of the past 26-monthperiod conducted. Compliant with practice rules and regulations Last UDS on record: Summary  Date Value Ref Range Status  05/25/2018 FINAL  Final    Comment:    ==================================================================== TOXASSURE SELECT 13 (MW) ==================================================================== Test                             Result       Flag       Units Drug Present   Amphetamine                    >3922                   ng/mg creat    Amphetamine is available as a schedule II prescription drug.   Oxazepam                       32                      ng/mg creat    Oxazepam may be administered as a scheduled prescription    medication; it is also an expected metabolite of other    benzodiazepine drugs, including diazepam, chlordiazepoxide,    prazepam, clorazepate, halazepam, and temazepam.   Hydrocodone                    1809                    ng/mg creat   Hydromorphone                  189                     ng/mg creat   Dihydrocodeine                 280                     ng/mg creat   Norhydrocodone                 563                     ng/mg creat    Sources of hydrocodone include scheduled prescription    medications. Hydromorphone, dihydrocodeine and norhydrocodone are    expected metabolites of hydrocodone. Hydromorphone and     dihydrocodeine are also available as scheduled prescription    medications. ==================================================================== Test                      Result    Flag   Units      Ref Range  Creatinine              255              mg/dL      >=20 ==================================================================== Declared Medications:  Medication list was not provided. ==================================================================== For clinical consultation, please call [phone]. ====================================================================    UDS interpretation: Compliant Patient reminded of the CDC guidelines recommending to stay away from the sedatives & benzodiazepines due to the risk of respiratory depression and death. She states that she only uses the valuim Prn and has had th 30 tabs for a long period of time. She only used the Trazodone occasionaly for sleep.  Medication Assessment Form: Reviewed. Patient indicates being compliant with therapy Treatment compliance: Compliant Risk Assessment Profile: Aberrant behavior: See prior evaluations. None observed or detected today Comorbid factors increasing risk of overdose: See prior notes. No additional risks detected today Opioid risk tool (ORT) (Total Score): 1 Personal History of Substance Abuse (SUD-Substance use disorder):  Alcohol: Negative  Illegal Drugs: Negative  Rx Drugs: Negative  ORT Risk Level calculation: Low Risk Risk of substance use disorder (SUD): Low Opioid Risk Tool - 08/23/18 0840      Family History of Substance Abuse   Alcohol  Negative    Illegal Drugs  Negative    Rx Drugs  Negative      Personal History of Substance Abuse   Alcohol  Negative    Illegal Drugs  Negative    Rx Drugs  Negative      Age   Age between 64-45 years   Yes      Psychological Disease   Psychological Disease  Negative    Depression  Negative      Total Score   Opioid Risk Tool  Scoring  1    Opioid Risk Interpretation  Low Risk      ORT Scoring interpretation table:  Score <3 = Low Risk for SUD  Score between 4-7 = Moderate Risk for SUD  Score >8 = High Risk for Opioid Abuse   Risk Mitigation Strategies:  Patient Counseling: Covered Patient-Prescriber Agreement (PPA): Present and active  Notification to other healthcare providers: Done  Pharmacologic Plan: No change in therapy, at this time.             Laboratory Chemistry  Inflammation Markers (CRP: Acute Phase) (ESR: Chronic Phase) Lab Results  Component Value Date   LATICACIDVEN 1.21 11/01/2014                         Rheumatology Markers No results found for: RF, ANA, LABURIC, URICUR, LYMEIGGIGMAB, LYMEABIGMQN, HLAB27                      Renal Function Markers Lab Results  Component Value Date   BUN 5 (L) 02/11/2018   CREATININE 0.53 02/11/2018   BCR 9 06/12/2014   GFRAA >60 02/11/2018   GFRNONAA >60 02/11/2018                             Hepatic Function Markers Lab Results  Component Value Date   AST 27 04/07/2017   ALT 30 04/07/2017   ALBUMIN 4.1 04/07/2017   ALKPHOS 90 04/07/2017   LIPASE 22 04/07/2017                        Electrolytes Lab Results  Component  Value Date   NA 139 02/11/2018   K 3.3 (L) 02/11/2018   CL 100 02/11/2018   CALCIUM 9.8 02/11/2018                        Neuropathy Markers Lab Results  Component Value Date   VITAMINB12 390 04/16/2014   HIV Non Reactive 04/08/2017                        CNS Tests Lab Results  Component Value Date   COLORCSF PINK (A) 11/11/2016   APPEARCSF HAZY (A) 11/11/2016   RBCCOUNTCSF 2,105 (H) 11/11/2016   WBCCSF 8 (H) 11/11/2016   POLYSCSF 0 11/11/2016   LYMPHSCSF 90 11/11/2016   EOSCSF 0 11/11/2016   PROTEINCSF 38 11/11/2016   GLUCCSF 66 11/11/2016   JCVIRUS Negative 11/11/2016   CSFOLI Comment 11/11/2016   IGGCSF 2.1 11/11/2016                        Bone Pathology Markers No results found for:  [place], VD125OH2TOT, AQ7622QJ3, [id number], 25OHVITD1, 25OHVITD2, 25OHVITD3, TESTOFREE, TESTOSTERONE                       Coagulation Parameters Lab Results  Component Value Date   PLT 233 02/11/2018                        Cardiovascular Markers Lab Results  Component Value Date   TROPONINI <0.03 02/11/2018   HGB 14.4 02/11/2018   HCT 41.5 02/11/2018                         CA Markers No results found for: CEA, CA125, LABCA2                      Endocrine Markers Lab Results  Component Value Date   TSH 1.06 04/16/2014                        Note: Lab results reviewed.  Recent Diagnostic Imaging Results  DG Chest 2 View CLINICAL DATA:  Chest pain.  EXAM: CHEST - 2 VIEW  COMPARISON:  February 03, 2017  FINDINGS: The heart size and mediastinal contours are within normal limits. Both lungs are clear. The visualized skeletal structures are unremarkable.  IMPRESSION: No active cardiopulmonary disease.  Electronically Signed   By: Dorise Bullion III M.D   On: 02/11/2018 21:07  Complexity Note: Imaging results reviewed. Results shared with Ms. Spenser, using Layman's terms.                         Meds   Current Outpatient Medications:  .  amLODipine (NORVASC) 5 MG tablet, Take 5 mg by mouth daily., Disp: , Rfl:  .  amphetamine-dextroamphetamine (ADDERALL) 20 MG tablet, Take by mouth daily. , Disp: , Rfl:  .  diazepam (VALIUM) 5 MG tablet, Take 5 mg by mouth every 12 (twelve) hours as needed for anxiety. , Disp: , Rfl:  .  HYDROcodone-acetaminophen (NORCO/VICODIN) 5-325 MG tablet, Take 1 tablet by mouth 3 (three) times daily as needed for up to 30 days for moderate pain or severe pain., Disp: 90 tablet, Rfl: 0 .  omeprazole (PRILOSEC) 40 MG capsule, Take by mouth daily., Disp: , Rfl: 3 .  SUMAtriptan (IMITREX) 100 MG tablet, Take 1/2-1 tab at headache onset, may repeat once if needed in 2 hours.  No more than 2 in 24 hours., Disp: , Rfl:  .  traZODone (DESYREL) 50 MG  tablet, Take 50 mg by mouth as needed., Disp: , Rfl:  .  Vitamin D, Ergocalciferol, (DRISDOL) 1.25 MG (50000 UT) CAPS capsule, TAKE ONE CAPSULE BY MOUTH ONE TIME PER WEEK, Disp: , Rfl: 11 .  [START ON 10/22/2018] HYDROcodone-acetaminophen (NORCO/VICODIN) 5-325 MG tablet, Take 1 tablet by mouth 3 (three) times daily as needed for up to 30 days for moderate pain., Disp: 90 tablet, Rfl: 0 .  [START ON 09/22/2018] HYDROcodone-acetaminophen (NORCO/VICODIN) 5-325 MG tablet, Take 1 tablet by mouth 3 (three) times daily as needed for up to 30 days for moderate pain or severe pain., Disp: 90 tablet, Rfl: 0  ROS  Constitutional: Denies any fever or chills Gastrointestinal: No reported hemesis, hematochezia, vomiting, or acute GI distress Musculoskeletal: Denies any acute onset joint swelling, redness, loss of ROM, or weakness Neurological: No reported episodes of acute onset apraxia, aphasia, dysarthria, agnosia, amnesia, paralysis, loss of coordination, or loss of consciousness  Allergies  Ms. Roper is allergic to robaxin [methocarbamol].  PFSH  Drug: Ms. Tarleton  reports no history of drug use. Alcohol:  reports no history of alcohol use. Tobacco:  reports that she quit smoking about 3 years ago. Her smoking use included cigarettes. She has a 1.50 pack-year smoking history. She has never used smokeless tobacco. Medical:  has a past medical history of Complication of anesthesia, Depression, Gallstones, Hypertension, Migraine, and Seizures ([place]) (2010-2013). Surgical: Ms. Romaniello  has a past surgical history that includes Cholecystectomy (2002); Neck surgery (2005 & 2009); Back surgery; Esophagogastroduodenoscopy (egd) with propofol (N/A, 04/10/2017); and Colonoscopy with propofol (N/A, 04/10/2017). Family: family history includes Kidney disease (age of onset: 49) in her brother; Kidney disease (age of onset: 22) in her father; Liver disease in her maternal grandfather; Ovarian cancer in her mother; Ulcerative  colitis in her mother.  Constitutional Exam  General appearance: Well nourished, well developed, and well hydrated. In no apparent acute distress Vitals:   08/23/18 0827 08/23/18 0828  BP: (!) 111/96 125/82  Pulse: 84 80  Resp: 16   Temp: 98.2 F (36.8 C)   TempSrc: Oral   SpO2: 99%   Weight: 197 lb (89.4 kg)   Height: 5' 8.5" (1.74 m)   Psych/Mental status: Alert, oriented x 3 (person, place, & time)       Eyes: PERLA Respiratory: No evidence of acute respiratory distress  Cervical Spine Area Exam  Skin & Axial Inspection: Well healed scar from previous spine surgery detected Alignment: Symmetrical Functional ROM: Unrestricted ROM      Stability: No instability detected Muscle Tone/Strength: Functionally intact. No obvious neuro-muscular anomalies detected. Sensory (Neurological): Unimpaired Palpation: No palpable anomalies              Upper Extremity (UE) Exam    Side: Right upper extremity  Side: Left upper extremity  Skin & Extremity Inspection: Skin color, temperature, and hair growth are WNL. No peripheral edema or cyanosis. No masses, redness, swelling, asymmetry, or associated skin lesions. No contractures.  Skin & Extremity Inspection: Skin color, temperature, and hair growth are WNL. No peripheral edema or cyanosis. No masses, redness, swelling, asymmetry, or associated skin lesions. No contractures.  Functional ROM: Adequate ROM          Functional ROM: Unrestricted ROM  Muscle Tone/Strength: Functionally intact. No obvious neuro-muscular anomalies detected.  Muscle Tone/Strength: Functionally intact. No obvious neuro-muscular anomalies detected.  Sensory (Neurological): Dermatomal pain pattern          Sensory (Neurological): Unimpaired          Palpation: No palpable anomalies              Palpation: No palpable anomalies                   Thoracic Spine Area Exam  Skin & Axial Inspection: No masses, redness, or swelling Alignment: Symmetrical Functional  ROM: Unrestricted ROM Stability: No instability detected Muscle Tone/Strength: Functionally intact. No obvious neuro-muscular anomalies detected. Sensory (Neurological): Unimpaired Muscle strength & Tone: No palpable anomalies  Lumbar Spine Area Exam  Skin & Axial Inspection: No masses, redness, or swelling Alignment: Symmetrical Functional ROM: Unrestricted ROM       Stability: No instability detected Muscle Tone/Strength: Functionally intact. No obvious neuro-muscular anomalies detected. Sensory (Neurological): Unimpaired Palpation: No palpable anomalies       Provocative Tests: Hyperextension/rotation test: deferred today       Lumbar quadrant test (Kemp's test): deferred today       Lateral bending test: deferred today       Patrick's Maneuver: deferred today                     Gait & Posture Assessment  Ambulation: Unassisted Gait: High stepping gait (foot drop) Posture: WNL   Lower Extremity Exam    Side: Right lower extremity  Side: Left lower extremity  Stability: No instability observed          Stability: No instability observed          Skin & Extremity Inspection: Brace worn  Skin & Extremity Inspection: Skin color, temperature, and hair growth are WNL. No peripheral edema or cyanosis. No masses, redness, swelling, asymmetry, or associated skin lesions. No contractures.  Functional ROM: Unrestricted ROM                  Functional ROM: Unrestricted ROM                  Muscle Tone/Strength: Functionally intact. No obvious neuro-muscular anomalies detected.  Muscle Tone/Strength: Functionally intact. No obvious neuro-muscular anomalies detected.  Sensory (Neurological): Dermatomal pain pattern        Sensory (Neurological): Unimpaired            Palpation: No palpable anomalies  Palpation: No palpable anomalies   Assessment  Primary Diagnosis & Pertinent Problem List: The primary encounter diagnosis was Chronic bilateral low back pain with bilateral sciatica.  Diagnoses of Chronic pain of both upper extremities, Chronic neck pain, Chronic pain of both lower extremities, Right foot drop, and Chronic pain syndrome were also pertinent to this visit.  Status Diagnosis  Controlled Controlled Controlled 1. Chronic bilateral low back pain with bilateral sciatica   2. Chronic pain of both upper extremities   3. Chronic neck pain   4. Chronic pain of both lower extremities   5. Right foot drop   6. Chronic pain syndrome     Problems updated and reviewed during this visit: No problems updated. Plan of Care  Pharmacotherapy (Medications Ordered): Meds ordered this encounter  Medications  . HYDROcodone-acetaminophen (NORCO/VICODIN) 5-325 MG tablet    Sig: Take 1 tablet by mouth 3 (three) times daily as needed for up to 30 days for moderate pain.  Dispense:  90 tablet    Refill:  0    Do not place this medication, or any other prescription from our practice, on "Automatic Refill". Patient may have prescription filled one day early if pharmacy is closed on scheduled refill date.    Order Specific Question:   Supervising Provider    Answer:   Milinda Pointer [phone]  . HYDROcodone-acetaminophen (NORCO/VICODIN) 5-325 MG tablet    Sig: Take 1 tablet by mouth 3 (three) times daily as needed for up to 30 days for moderate pain or severe pain.    Dispense:  90 tablet    Refill:  0    Do not place this medication, or any other prescription from our practice, on "Automatic Refill". Patient may have prescription filled one day early if pharmacy is closed on scheduled refill date.    Order Specific Question:   Supervising Provider    Answer:   Milinda Pointer [phone]  . HYDROcodone-acetaminophen (NORCO/VICODIN) 5-325 MG tablet    Sig: Take 1 tablet by mouth 3 (three) times daily as needed for up to 30 days for moderate pain or severe pain.    Dispense:  90 tablet    Refill:  0    Do not place this medication, or any other prescription from our  practice, on "Automatic Refill". Patient may have prescription filled one day early if pharmacy is closed on scheduled refill date.    Order Specific Question:   Supervising Provider    Answer:   Milinda Pointer [[id number]]   New Prescriptions   No medications on file   Medications administered today: [name] had no medications administered during this visit. Lab-work, procedure(s), and/or referral(s): No orders of the defined types were placed in this encounter.  Imaging and/or referral(s): None  Interventional therapies: Planned, scheduled, and/or pending:   Not at this time.  Provider-requested follow-up: Return in about 3 months (around 11/13/2018) for MedMgmt.  Future Appointments  Date Time Provider Abanda  11/13/2018  8:30 AM Vevelyn Francois, NP [facility] None   Primary Care Physician: Idelle Crouch, MD Location: [facility] Note by: Vevelyn Francois NP Date: 08/23/2018; Time: 3:42 PM  Pain Score Disclaimer: We use the NRS-11 scale. This is a self-reported, subjective measurement of pain severity with only modest accuracy. It is used primarily to identify changes within a particular patient. It must be understood that outpatient pain scales are significantly less accurate that those used for research, where they can be applied under ideal controlled circumstances with minimal exposure to variables. In reality, the score is likely to be a combination of pain intensity and pain affect, where pain affect describes the degree of emotional arousal or changes in action readiness caused by the sensory experience of pain. Factors such as social and work situation, setting, emotional state, anxiety levels, expectation, and prior pain experience may influence pain perception and show large inter-individual differences that may also be affected by time variables.  Patient instructions provided during this appointment: Patient Instructions   ____________________________________________________________________________________________  Medication Rules  Purpose: To inform patients, and their family members, of our rules and regulations.  Applies to: All patients receiving prescriptions (written or electronic).  Pharmacy of record: Pharmacy where electronic prescriptions will be sent. If written prescriptions are taken to a different pharmacy, please inform the nursing staff. The pharmacy listed in the electronic medical record should be the one where you would like electronic prescriptions to be sent.  Electronic  prescriptions: In compliance with the [place] (STOP) Act of 2017 (Session Lanny Cramp [phone]), effective July 19, 2018, all controlled substances must be electronically prescribed. Calling prescriptions to the pharmacy will cease to exist.  Prescription refills: Only during scheduled appointments. Applies to all prescriptions.  NOTE: The following applies primarily to controlled substances (Opioid* Pain Medications).   Patient's responsibilities: 1. Pain Pills: Bring all pain pills to every appointment (except for procedure appointments). 2. Pill Bottles: Bring pills in original pharmacy bottle. Always bring the newest bottle. Bring bottle, even if empty. 3. Medication refills: You are responsible for knowing and keeping track of what medications you take and those you need refilled. The day before your appointment: write a list of all prescriptions that need to be refilled. The day of the appointment: give the list to the admitting nurse. Prescriptions will be written only during appointments. If you forget a medication: it will not be "Called in", "Faxed", or "electronically sent". You will need to get another appointment to get these prescribed. No early refills. Do not call asking to have your prescription filled early. 4. Prescription Accuracy: You are responsible for  carefully inspecting your prescriptions before leaving our office. Have the discharge nurse carefully go over each prescription with you, before taking them home. Make sure that your name is accurately spelled, that your address is correct. Check the name and dose of your medication to make sure it is accurate. Check the number of pills, and the written instructions to make sure they are clear and accurate. Make sure that you are given enough medication to last until your next medication refill appointment. 5. Taking Medication: Take medication as prescribed. When it comes to controlled substances, taking less pills or less frequently than prescribed is permitted and encouraged. Never take more pills than instructed. Never take medication more frequently than prescribed.  6. Inform other Doctors: Always inform, all of your healthcare providers, of all the medications you take. 7. Pain Medication from other Providers: You are not allowed to accept any additional pain medication from any other Doctor or Healthcare provider. There are two exceptions to this rule. (see below) In the event that you require additional pain medication, you are responsible for notifying us, as stated below. 8. Medication Agreement: You are responsible for carefully reading and following our Medication Agreement. This must be signed before receiving any prescriptions from our practice. Safely store a copy of your signed Agreement. Violations to the Agreement will result in no further prescriptions. (Additional copies of our Medication Agreement are available upon request.) 9. Laws, Rules, & Regulations: All patients are expected to follow all Federal and [employer], [employer], Rules, [employer]. Ignorance of the Laws does not constitute a valid excuse. The use of any illegal substances is prohibited. 10. Adopted CDC guidelines & recommendations: Target dosing levels will be at or below 60 MME/day. Use of benzodiazepines** is not  recommended.  Exceptions: There are only two exceptions to the rule of not receiving pain medications from other Healthcare Providers. 1. Exception #1 (Emergencies): In the event of an emergency (i.e.: accident requiring emergency care), you are allowed to receive additional pain medication. However, you are responsible for: As soon as you are able, call our office [phone], at any time of the day or night, and leave a message stating your name, the date and nature of the emergency, and the name and dose of the medication prescribed. In the event that your call is  answered by a member of our staff, make sure to document and save the date, time, and the name of the person that took your information.  2. Exception #2 (Planned Surgery): In the event that you are scheduled by another doctor or dentist to have any type of surgery or procedure, you are allowed (for a period no longer than 30 days), to receive additional pain medication, for the acute post-op pain. However, in this case, you are responsible for picking up a copy of our "Post-op Pain Management for Surgeons" handout, and giving it to your surgeon or dentist. This document is available at our office, and does not require an appointment to obtain it. Simply go to our office during business hours (Monday-Thursday from 8:00 AM to 4:00 PM) (Friday 8:00 AM to 12:00 Noon) or if you have a scheduled appointment with [place], prior to your surgery, and ask for it by name. In addition, you will need to provide [place] with your name, name of your surgeon, type of surgery, and date of procedure or surgery.  *Opioid medications include: morphine, codeine, oxycodone, oxymorphone, hydrocodone, hydromorphone, meperidine, tramadol, tapentadol, buprenorphine, fentanyl, methadone. **Benzodiazepine medications include: diazepam (Valium), alprazolam (Xanax), clonazepam (Klonopine), lorazepam (Ativan), clorazepate (Tranxene), chlordiazepoxide (Librium), estazolam (Prosom),  oxazepam (Serax), temazepam (Restoril), triazolam (Halcion) (Last updated: 09/15/2017) ____________________________________________________________________________________________   Hydrocodone - apap 5-325 mg x 3 months escribed to [facility].  Fill dates: 08/23/18, 09/22/18, 10/22/18

## 2018-08-23 NOTE — Progress Notes (Signed)
Nursing Pain Medication Assessment:  Safety precautions to be maintained throughout the outpatient stay will include: orient to surroundings, keep bed in low position, maintain call bell within reach at all times, provide assistance with transfer out of bed and ambulation.  Medication Inspection Compliance: Pill count conducted under aseptic conditions, in front of the patient. Neither the pills nor the bottle was removed from the patient's sight at any time. Once count was completed pills were immediately returned to the patient in their original bottle.  Medication: Hydrocodone/APAP Pill/Patch Count: 6 of 90 pills remain Pill/Patch Appearance: Markings consistent with prescribed medication Bottle Appearance: Standard pharmacy container. Clearly labeled. Filled Date: 01 / 04 / 2020 Last Medication intake:  Today

## 2018-08-31 ENCOUNTER — Ambulatory Visit
Admission: RE | Admit: 2018-08-31 | Discharge: 2018-08-31 | Disposition: A | Discharge status: Home / Self Care | Payer: 59 | Source: Ambulatory Visit | Attending: Family Medicine | Admitting: Family Medicine

## 2018-08-31 ENCOUNTER — Other Ambulatory Visit: Payer: Self-pay | Admitting: Family Medicine

## 2018-08-31 DIAGNOSIS — M5412 Radiculopathy, cervical region: Secondary | ICD-10-CM

## 2018-08-31 DIAGNOSIS — Z9889 Other specified postprocedural states: Secondary | ICD-10-CM | POA: Diagnosis not present

## 2018-08-31 DIAGNOSIS — R29898 Other symptoms and signs involving the musculoskeletal system: Secondary | ICD-10-CM | POA: Diagnosis not present

## 2018-08-31 DIAGNOSIS — M47812 Spondylosis without myelopathy or radiculopathy, cervical region: Secondary | ICD-10-CM | POA: Diagnosis not present

## 2018-09-01 ENCOUNTER — Other Ambulatory Visit: Payer: Self-pay | Admitting: Family Medicine

## 2018-09-01 DIAGNOSIS — M5412 Radiculopathy, cervical region: Secondary | ICD-10-CM

## 2018-09-12 DIAGNOSIS — R51 Headache: Secondary | ICD-10-CM | POA: Diagnosis not present

## 2018-09-12 DIAGNOSIS — I10 Essential (primary) hypertension: Secondary | ICD-10-CM | POA: Diagnosis not present

## 2018-09-12 DIAGNOSIS — G479 Sleep disorder, unspecified: Secondary | ICD-10-CM | POA: Diagnosis not present

## 2018-09-26 DIAGNOSIS — R202 Paresthesia of skin: Secondary | ICD-10-CM | POA: Diagnosis not present

## 2018-09-26 DIAGNOSIS — R51 Headache: Secondary | ICD-10-CM | POA: Diagnosis not present

## 2018-10-25 DIAGNOSIS — R51 Headache: Secondary | ICD-10-CM | POA: Diagnosis not present

## 2018-10-26 ENCOUNTER — Other Ambulatory Visit: Payer: Self-pay | Admitting: Neurology

## 2018-10-26 DIAGNOSIS — R519 Headache, unspecified: Secondary | ICD-10-CM

## 2018-10-26 DIAGNOSIS — G8929 Other chronic pain: Secondary | ICD-10-CM

## 2018-10-26 DIAGNOSIS — R5381 Other malaise: Secondary | ICD-10-CM | POA: Diagnosis not present

## 2018-10-26 DIAGNOSIS — R5383 Other fatigue: Secondary | ICD-10-CM | POA: Diagnosis not present

## 2018-10-26 DIAGNOSIS — L659 Nonscarring hair loss, unspecified: Secondary | ICD-10-CM | POA: Diagnosis not present

## 2018-10-26 DIAGNOSIS — R51 Headache: Principal | ICD-10-CM

## 2018-10-26 IMAGING — CT CT ABD-PELV W/ CM
2 of 5 series · 16 of 46 positions shown, 18 images · IV contrast (APPLIED)
Comparison: 11/01/2014

CLINICAL DATA: Epigastric pain with nausea and vomiting

EXAM:
CT ABDOMEN AND PELVIS WITH CONTRAST
TECHNIQUE: Multidetector CT imaging of the abdomen and pelvis was performed
using the standard protocol following bolus administration of
intravenous contrast.
CONTRAST:  100mL [id number] IOPAMIDOL ([id number]) INJECTION 61%

[Series 2: routine abd/pel with · axial · 0.92mm/px · z∈[-328,+147]mm · 13 of 107 slices shown, 15 images]
[im 6/107  soft-tissue]
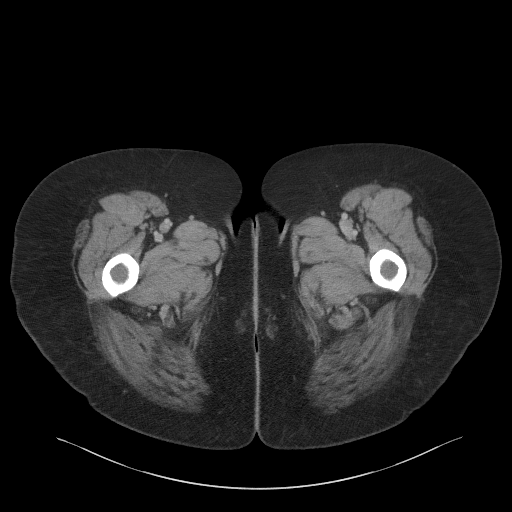
[im 6/107  bone]
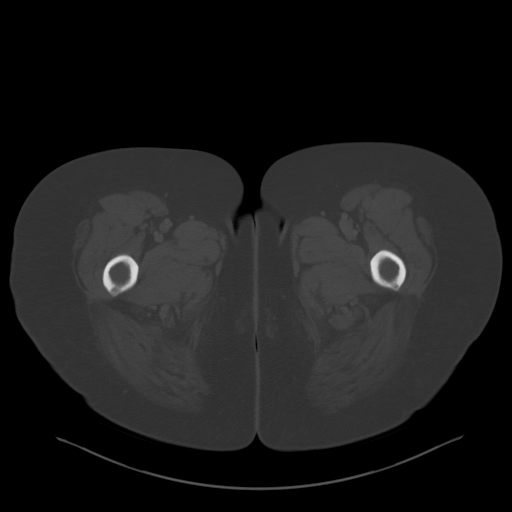
[im 17/107  soft-tissue]
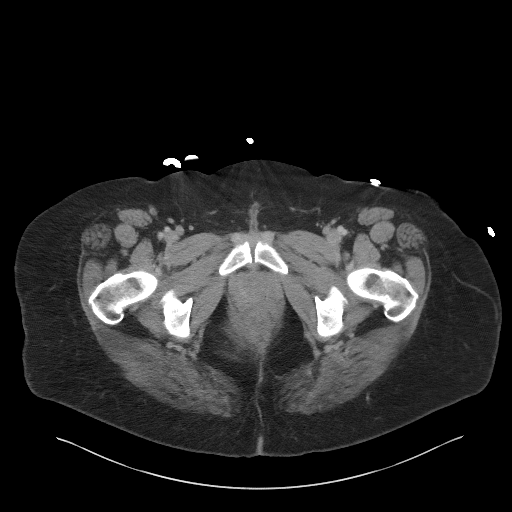
[im 23/107  soft-tissue]
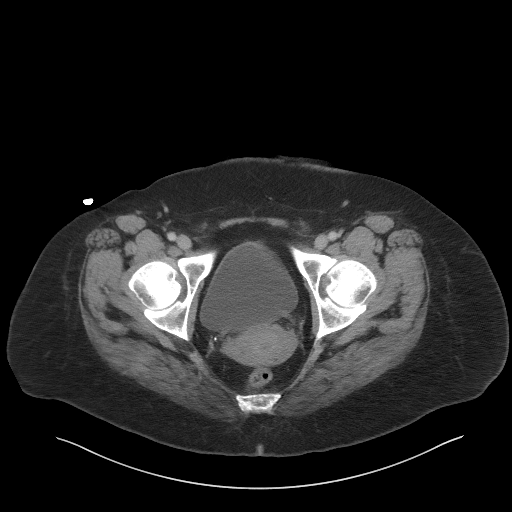
[im 28/107  soft-tissue]
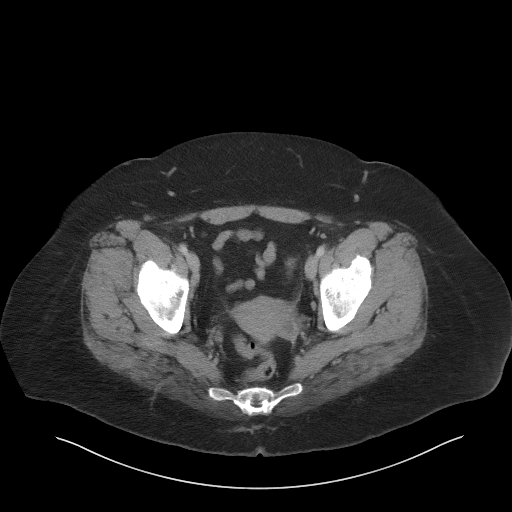
[im 40/107  soft-tissue]
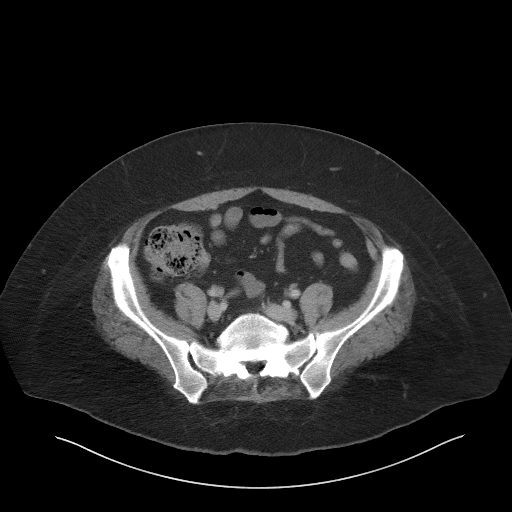
[im 45/107  soft-tissue]
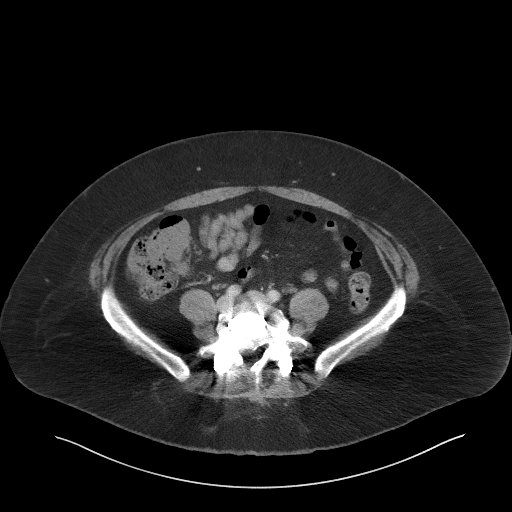
[im 56/107  soft-tissue]
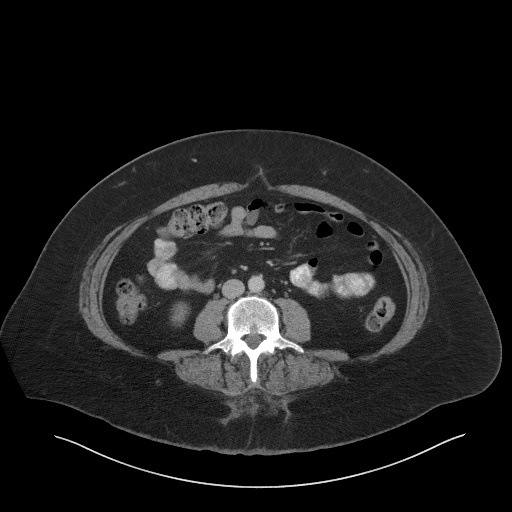
[im 62/107  soft-tissue]
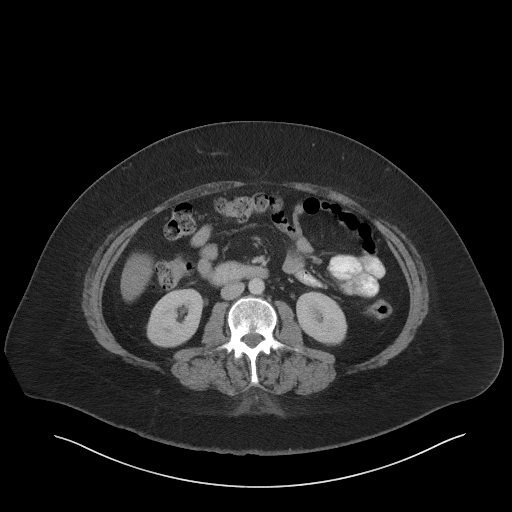
[im 67/107  soft-tissue]
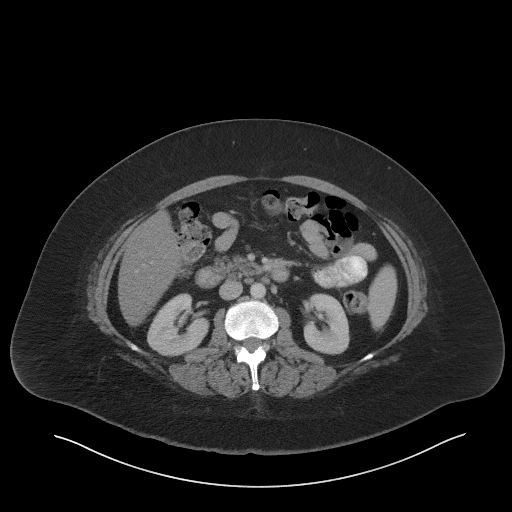
[im 67/107  bone]
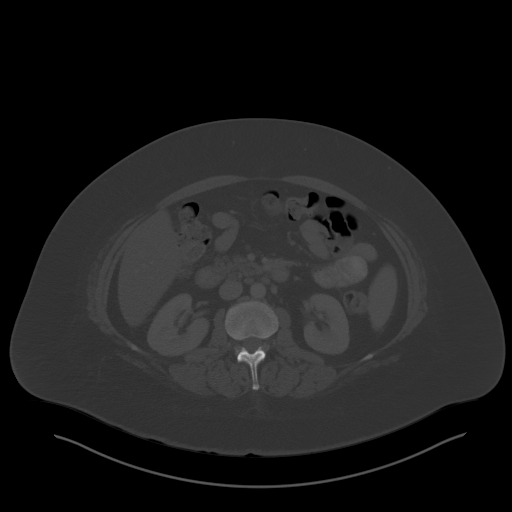
[im 79/107  soft-tissue]
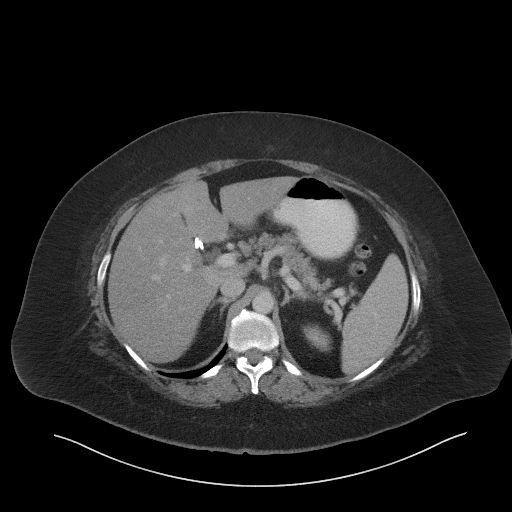
[im 84/107  soft-tissue]
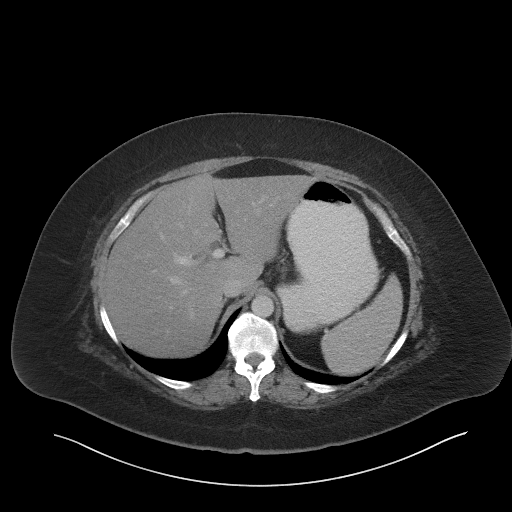
[im 90/107  soft-tissue]
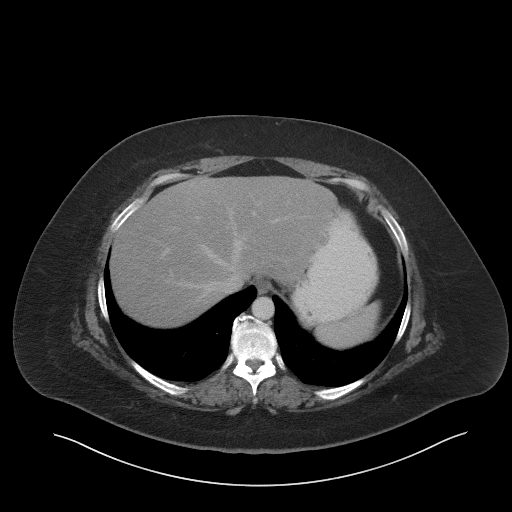
[im 101/107  soft-tissue]
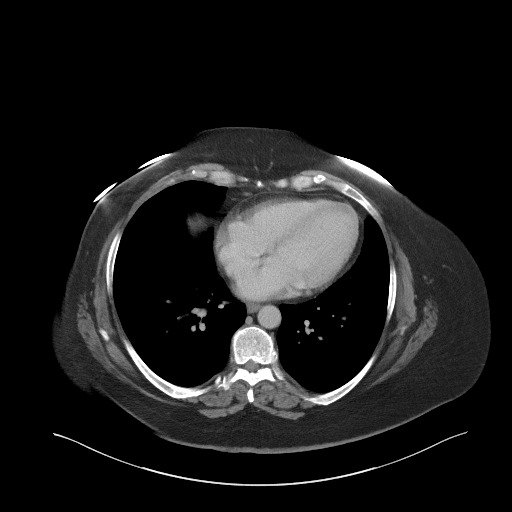

[Series 5: coronal st · coronal · 0.69mm/px · 3 of 108 slices shown]
[im 36/108  soft-tissue]
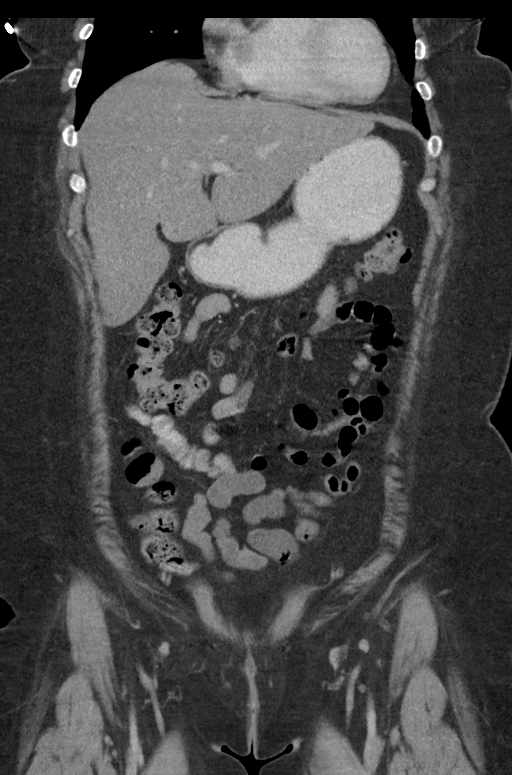
[im 48/108  soft-tissue]
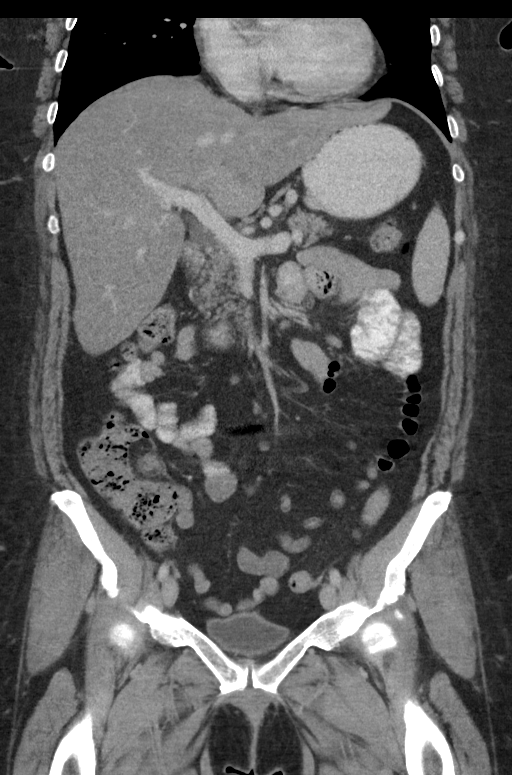
[im 60/108  soft-tissue]
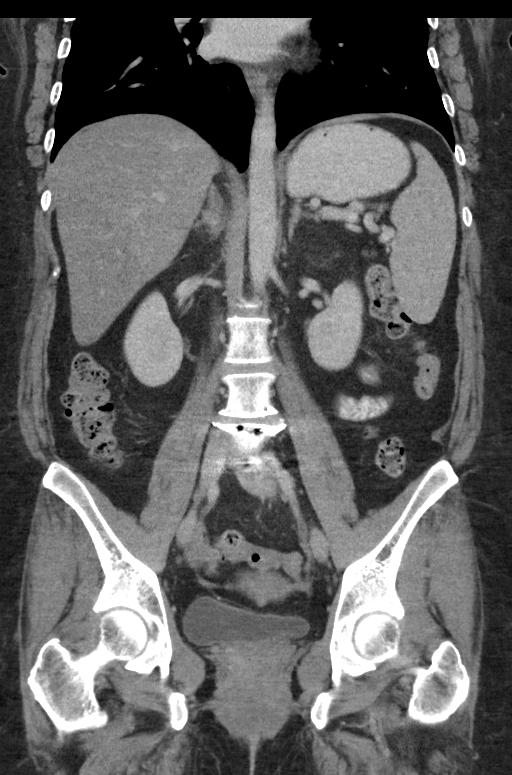

[16 of 46 positions shown; findings below may reference images not displayed]

FINDINGS: Lower chest: No acute abnormality.

Hepatobiliary: Mild steatosis. Surgical clips at the gallbladder
fossa. No biliary dilatation. No focal hepatic abnormality.

Pancreas: Unremarkable. No pancreatic ductal dilatation or
surrounding inflammatory changes.

Spleen: Normal in size without focal abnormality.

Adrenals/Urinary Tract: Adrenal glands are unremarkable. Kidneys are
normal, without renal calculi, focal lesion, or hydronephrosis.
Bladder is unremarkable.

Stomach/Bowel: Stomach is within normal limits. Appendix appears
normal. No evidence of bowel wall thickening, distention, or
inflammatory changes.

Vascular/Lymphatic: No significant vascular findings are present. No
enlarged abdominal or pelvic lymph nodes.

Reproductive: Uterus and bilateral adnexa are unremarkable.

Other: Negative for free air or free fluid. Fat containing umbilical
hernia.

Musculoskeletal: Posterior stabilization rods and fixating screws at
L4-L5 with interbody device.
IMPRESSION: 1. No CT evidence for acute intra-abdominal or pelvic pathology.
2. Mild steatosis

## 2018-10-30 DIAGNOSIS — R5383 Other fatigue: Secondary | ICD-10-CM | POA: Diagnosis not present

## 2018-10-30 DIAGNOSIS — L659 Nonscarring hair loss, unspecified: Secondary | ICD-10-CM | POA: Diagnosis not present

## 2018-10-30 DIAGNOSIS — R5381 Other malaise: Secondary | ICD-10-CM | POA: Diagnosis not present

## 2018-11-11 DIAGNOSIS — R202 Paresthesia of skin: Secondary | ICD-10-CM | POA: Insufficient documentation

## 2018-11-13 ENCOUNTER — Other Ambulatory Visit: Payer: Self-pay

## 2018-11-13 ENCOUNTER — Ambulatory Visit: Payer: 59 | Attending: Nurse Practitioner | Admitting: Nurse Practitioner

## 2018-11-13 DIAGNOSIS — M542 Cervicalgia: Secondary | ICD-10-CM

## 2018-11-13 DIAGNOSIS — M5442 Lumbago with sciatica, left side: Secondary | ICD-10-CM

## 2018-11-13 DIAGNOSIS — G8929 Other chronic pain: Secondary | ICD-10-CM

## 2018-11-13 DIAGNOSIS — M5416 Radiculopathy, lumbar region: Secondary | ICD-10-CM | POA: Diagnosis not present

## 2018-11-13 DIAGNOSIS — M5441 Lumbago with sciatica, right side: Secondary | ICD-10-CM

## 2018-11-13 DIAGNOSIS — G894 Chronic pain syndrome: Secondary | ICD-10-CM

## 2018-11-13 MED ORDER — HYDROCODONE-ACETAMINOPHEN 5-325 MG PO TABS: 1.0000 | ORAL_TABLET | Freq: Three times a day (TID) | ORAL | 0 refills | Status: AC | PRN

## 2018-11-13 MED ORDER — TIZANIDINE HCL 4 MG PO TABS: 4.0000 mg | ORAL_TABLET | Freq: Three times a day (TID) | ORAL | 1 refills | Status: DC | PRN

## 2018-11-13 NOTE — Progress Notes (Signed)
Pain Management Encounter Note - Virtual Visit via Telephone Telehealth (real-time audio visits between healthcare provider and patient).  Patient's Phone No. & Preferred Pharmacy:  [phone] (home); [phone] (mobile); (Preferred) [place], [place] - [address] [place] [address] Phone: [phone] Fax: [phone]   Pre-screening note:  Our staff contacted Ms. Creason and offered her an "in person", "face-to-face" appointment versus a telephone encounter. She indicated preferring the telephone encounter, at this time.  Reason for Virtual Visit: COVID-19*  Social distancing based on CDC and AMA recommendations.   I contacted [name] on 11/13/2018 at 8:31 AM by telephone and clearly identified myself as Dionisio David, NP. I verified that I was speaking with the correct person using two identifiers (Name and date of birth: 04-Nov-1970).  Advanced Informed Consent I sought verbal advanced consent from [name] for telemedicine interactions and virtual visit. I informed Ms. Markel of the security and privacy concerns, risks, and limitations associated with performing an evaluation and management service by telephone. I also informed Ms. Godfrey of the availability of "in person" appointments and I informed her of the possibility of a patient responsible charge related to this service. Ms. Hottle expressed understanding and agreed to proceed.   Historic Elements   Ms. RAMONITA KOENIG is a 48 y.o. year old, female patient evaluated today after her last encounter by our practice on 08/23/2018. Ms. Cura  has a past medical history of Complication of anesthesia, Depression, Gallstones, Hypertension, Migraine, and Seizures ([place]) (2010-2013). She also  has a past surgical history that includes Cholecystectomy (2002); Neck surgery (2005 & 2009); Back surgery; Esophagogastroduodenoscopy (egd) with propofol (N/A, 04/10/2017); and  Colonoscopy with propofol (N/A, 04/10/2017). Ms. Hunton has a current medication list which includes the following prescription(s): amphetamine-dextroamphetamine, amlodipine, amphetamine-dextroamphetamine, diazepam, hydrocodone-acetaminophen, hydrocodone-acetaminophen, hydrocodone-acetaminophen, nortriptyline, omeprazole, sumatriptan, tizanidine, trazodone, and vitamin d (ergocalciferol). She  reports that she quit smoking about 4 years ago. Her smoking use included cigarettes. She has a 1.50 pack-year smoking history. She has never used smokeless tobacco. She reports that she does not drink alcohol or use drugs. Ms. Cuneo is allergic to robaxin [methocarbamol].   HPI  I last saw her on 08/23/2018. She is being evaluated for medication management.  She rates her pain a 3/10. She has upper back and lower back pain. She has pain in between her shoulders. She also has low back pain that goes down her right leg. She has numbness, tingling with sharp pain and burning that "shoots through into her toes." She has weakness in her right arm hand. She feels like her strength is going. She has had EMG/NCS 2 years ago.  She did have MRI of her neck. She has budging disc. She was given a steroid dosepake for her right shoulder. She feels like this was effective for her arm pain. She feels like it made other ares of pain flare up. She feels like she has bearable days and someday's it is not.   Pharmacotherapy Assessment  Analgesic:Hydrocodone 5 mg3 times daily as needed MME/day:15mg /day.  Monitoring: Pharmacotherapy: No side-effects or adverse reactions reported. [place] PMP: PDMP reviewed during this encounter.       Compliance: No problems identified. Plan: Refer to "POC".  Review of recent tests  MR CERVICAL SPINE WO CONTRAST CLINICAL DATA:  48 y/o F; right-sided neck, shoulder, and arm pain with weakness. Symptoms progressively worsening for 2 weeks.  EXAM: MRI CERVICAL SPINE WITHOUT  CONTRAST  TECHNIQUE: Multiplanar, multisequence MR imaging of the cervical spine was performed. No intravenous contrast was administered.  COMPARISON:  10/07/2017 CT cervical myelogram.  FINDINGS: Alignment: Straightening of cervical lordosis. Stable minimal grade 1 C4-5 anterolisthesis.  Vertebrae: C5-C7 ACDF. Susceptibility artifact partially obscures the vertebral bodies at the levels of fusion. No findings of fracture or discitis. No suspicious bone lesion.  Cord: Normal signal and morphology.  Posterior Fossa, vertebral arteries, paraspinal tissues: Negative.  Disc levels:  C2-3: No significant disc displacement, foraminal stenosis, or canal stenosis.  C3-4: No significant disc displacement, foraminal stenosis, or canal stenosis.  C4-5: Mild disc osteophyte complex with left-greater-than-right uncovertebral and facet hypertrophy. Mild right and moderate to severe left neural foraminal stenosis. No significant canal stenosis.  C5-6: ACDF.  No significant foraminal or spinal canal stenosis.  C6-7: ACDF.  No significant foraminal or spinal canal stenosis.  C7-T1: No significant disc displacement, foraminal stenosis, or canal stenosis.  IMPRESSION: 1. C5-C7 ACDF.  No acute osseous or cord signal abnormality. 2. Cervical spondylosis predominantly at the C4-5 level where there is mild right and moderate to severe left foraminal stenosis. Degenerative changes are stable from prior myelogram given differences in technique.  Electronically Signed   By: Kristine Garbe M.D.   On: 08/31/2018 17:49   Office Visit on 06/21/2018  Component Date Value Ref Range Status  . Adequacy 06/21/2018 Satisfactory for evaluation  endocervical/transformation zone component PRESENT.   Final  . Diagnosis 06/21/2018 NEGATIVE FOR INTRAEPITHELIAL LESIONS OR MALIGNANCY.   Final  . HPV 06/21/2018 NOT DETECTED   Final   Normal Reference Range - NOT Detected  . Material Submitted  06/21/2018 CervicoVaginal Pap [ThinPrep Imaged]   Final   Assessment  The primary encounter diagnosis was Lumbar radiculopathy. Diagnoses of Chronic pain syndrome, Chronic neck pain, and Chronic bilateral low back pain with bilateral sciatica were also pertinent to this visit.  Plan of Care  I am having [name] maintain her diazepam, SUMAtriptan, Vitamin D (Ergocalciferol), omeprazole, amphetamine-dextroamphetamine, amLODipine, traZODone, nortriptyline, amphetamine-dextroamphetamine, HYDROcodone-acetaminophen, HYDROcodone-acetaminophen, HYDROcodone-acetaminophen, and tiZANidine.  Pharmacotherapy (Medications Ordered): Meds ordered this encounter  Medications  . HYDROcodone-acetaminophen (NORCO/VICODIN) 5-325 MG tablet    Sig: Take 1 tablet by mouth 3 (three) times daily as needed for up to 30 days for moderate pain.    Dispense:  90 tablet    Refill:  0    Do not place this medication, or any other prescription from our practice, on "Automatic Refill". Patient may have prescription filled one day early if pharmacy is closed on scheduled refill date.    Order Specific Question:   Supervising Provider    Answer:   Gillis Santa [[id number]]  . HYDROcodone-acetaminophen (NORCO/VICODIN) 5-325 MG tablet    Sig: Take 1 tablet by mouth 3 (three) times daily as needed for up to 30 days for moderate pain or severe pain.    Dispense:  90 tablet    Refill:  0    Do not place this medication, or any other prescription from our practice, on "Automatic Refill". Patient may have prescription filled one day early if pharmacy is closed on scheduled refill date.    Order Specific Question:   Supervising Provider    Answer:   Gillis Santa [[id number]]  . HYDROcodone-acetaminophen (NORCO/VICODIN) 5-325 MG tablet    Sig: Take 1 tablet by mouth 3 (three) times daily as needed for up to 30 days for moderate pain or severe pain.    Dispense:  90 tablet  Refill:  0    Do not place this medication, or any  other prescription from our practice, on "Automatic Refill". Patient may have prescription filled one day early if pharmacy is closed on scheduled refill date.    Order Specific Question:   Supervising Provider    Answer:   Gillis Santa [[id number]]  . tiZANidine (ZANAFLEX) 4 MG tablet    Sig: Take 1 tablet (4 mg total) by mouth every 8 (eight) hours as needed for muscle spasms.    Dispense:  90 tablet    Refill:  1    Order Specific Question:   Supervising Provider    Answer:   Milinda Pointer [phone]   Orders:  No orders of the defined types were placed in this encounter.  Follow-up plan:   Return in about 3 months (around 02/12/2019) for MedMgmt.   I discussed the assessment and treatment plan with the patient. The patient was provided an opportunity to ask questions and all were answered. The patient agreed with the plan and demonstrated an understanding of the instructions.  Patient advised to call back or seek an in-person evaluation if the symptoms or condition worsens.  Total duration of non-face-to-face encounter: 13 minutes.  Note by: Dionisio David, NP Date: 11/13/2018; Time: 8:48 AM  Disclaimer:  * Given the special circumstances of the COVID-19 pandemic, the federal government has announced that the Office for Civil Rights (OCR) will exercise its enforcement discretion and will not impose penalties on physicians using telehealth in the event of noncompliance with regulatory requirements under the [place] and [place] (HIPAA) in connection with the good faith provision of telehealth during the CHENI-77 national public health emergency. ([place])

## 2018-11-13 NOTE — Patient Instructions (Signed)
____________________________________________________________________________________________  Medication Rules  Purpose: To inform patients, and their family members, of our rules and regulations.  Applies to: All patients receiving prescriptions (written or electronic).  Pharmacy of record: Pharmacy where electronic prescriptions will be sent. If written prescriptions are taken to a different pharmacy, please inform the nursing staff. The pharmacy listed in the electronic medical record should be the one where you would like electronic prescriptions to be sent.  Electronic prescriptions: In compliance with the [place] Strengthen Opioid Misuse Prevention (STOP) Act of 2017 (Session Law 2017-74/H243), effective July 19, 2018, all controlled substances must be electronically prescribed. Calling prescriptions to the pharmacy will cease to exist.  Prescription refills: Only during scheduled appointments. Applies to all prescriptions.  NOTE: The following applies primarily to controlled substances (Opioid* Pain Medications).   Patient's responsibilities: 1. Pain Pills: Bring all pain pills to every appointment (except for procedure appointments). 2. Pill Bottles: Bring pills in original pharmacy bottle. Always bring the newest bottle. Bring bottle, even if empty. 3. Medication refills: You are responsible for knowing and keeping track of what medications you take and those you need refilled. The day before your appointment: write a list of all prescriptions that need to be refilled. The day of the appointment: give the list to the admitting nurse. Prescriptions will be written only during appointments. No prescriptions will be written on procedure days. If you forget a medication: it will not be "Called in", "Faxed", or "electronically sent". You will need to get another appointment to get these prescribed. No early refills. Do not call asking to have your prescription filled  early. 4. Prescription Accuracy: You are responsible for carefully inspecting your prescriptions before leaving our office. Have the discharge nurse carefully go over each prescription with you, before taking them home. Make sure that your name is accurately spelled, that your address is correct. Check the name and dose of your medication to make sure it is accurate. Check the number of pills, and the written instructions to make sure they are clear and accurate. Make sure that you are given enough medication to last until your next medication refill appointment. 5. Taking Medication: Take medication as prescribed. When it comes to controlled substances, taking less pills or less frequently than prescribed is permitted and encouraged. Never take more pills than instructed. Never take medication more frequently than prescribed.  6. Inform other Doctors: Always inform, all of your healthcare providers, of all the medications you take. 7. Pain Medication from other Providers: You are not allowed to accept any additional pain medication from any other Doctor or Healthcare provider. There are two exceptions to this rule. (see below) In the event that you require additional pain medication, you are responsible for notifying us, as stated below. 8. Medication Agreement: You are responsible for carefully reading and following our Medication Agreement. This must be signed before receiving any prescriptions from our practice. Safely store a copy of your signed Agreement. Violations to the Agreement will result in no further prescriptions. (Additional copies of our Medication Agreement are available upon request.) 9. Laws, Rules, & Regulations: All patients are expected to follow all Federal and State Laws, Statutes, Rules, & Regulations. Ignorance of the Laws does not constitute a valid excuse. The use of any illegal substances is prohibited. 10. Adopted CDC guidelines & recommendations: Target dosing levels will be  at or below 60 MME/day. Use of benzodiazepines** is not recommended.  Exceptions: There are only two exceptions to the rule of not   receiving pain medications from other Healthcare Providers. 1. Exception #1 (Emergencies): In the event of an emergency (i.e.: accident requiring emergency care), you are allowed to receive additional pain medication. However, you are responsible for: As soon as you are able, call our office [phone], at any time of the day or night, and leave a message stating your name, the date and nature of the emergency, and the name and dose of the medication prescribed. In the event that your call is answered by a member of our staff, make sure to document and save the date, time, and the name of the person that took your information.  2. Exception #2 (Planned Surgery): In the event that you are scheduled by another doctor or dentist to have any type of surgery or procedure, you are allowed (for a period no longer than 30 days), to receive additional pain medication, for the acute post-op pain. However, in this case, you are responsible for picking up a copy of our "Post-op Pain Management for Surgeons" handout, and giving it to your surgeon or dentist. This document is available at our office, and does not require an appointment to obtain it. Simply go to our office during business hours (Monday-Thursday from 8:00 AM to 4:00 PM) (Friday 8:00 AM to 12:00 Noon) or if you have a scheduled appointment with us, prior to your surgery, and ask for it by name. In addition, you will need to provide us with your name, name of your surgeon, type of surgery, and date of procedure or surgery.  *Opioid medications include: morphine, codeine, oxycodone, oxymorphone, hydrocodone, hydromorphone, meperidine, tramadol, tapentadol, buprenorphine, fentanyl, methadone. **Benzodiazepine medications include: diazepam (Valium), alprazolam (Xanax), clonazepam (Klonopine), lorazepam (Ativan), clorazepate  (Tranxene), chlordiazepoxide (Librium), estazolam (Prosom), oxazepam (Serax), temazepam (Restoril), triazolam (Halcion) (Last updated: 09/15/2017) ____________________________________________________________________________________________    

## 2018-11-20 DIAGNOSIS — G479 Sleep disorder, unspecified: Secondary | ICD-10-CM | POA: Diagnosis not present

## 2018-11-20 DIAGNOSIS — R51 Headache: Secondary | ICD-10-CM | POA: Diagnosis not present

## 2018-11-20 DIAGNOSIS — R202 Paresthesia of skin: Secondary | ICD-10-CM | POA: Diagnosis not present

## 2018-11-21 ENCOUNTER — Telehealth: Payer: Self-pay

## 2018-11-21 NOTE — Telephone Encounter (Signed)
Noted  

## 2018-11-21 NOTE — Telephone Encounter (Signed)
She wanted to let Dr. Holley Raring and Donella Stade know that the neurologist put her on gabapentin for her headaches.

## 2018-11-29 DIAGNOSIS — R509 Fever, unspecified: Secondary | ICD-10-CM | POA: Diagnosis not present

## 2018-11-29 DIAGNOSIS — R109 Unspecified abdominal pain: Secondary | ICD-10-CM | POA: Diagnosis not present

## 2018-11-29 DIAGNOSIS — R6889 Other general symptoms and signs: Secondary | ICD-10-CM | POA: Diagnosis not present

## 2018-12-01 DIAGNOSIS — R1032 Left lower quadrant pain: Secondary | ICD-10-CM | POA: Diagnosis not present

## 2018-12-01 DIAGNOSIS — R1031 Right lower quadrant pain: Secondary | ICD-10-CM | POA: Diagnosis not present

## 2018-12-01 DIAGNOSIS — R300 Dysuria: Secondary | ICD-10-CM | POA: Diagnosis not present

## 2018-12-13 ENCOUNTER — Other Ambulatory Visit: Payer: Self-pay | Admitting: Internal Medicine

## 2018-12-13 ENCOUNTER — Telehealth: Payer: Self-pay

## 2018-12-13 DIAGNOSIS — R1084 Generalized abdominal pain: Secondary | ICD-10-CM

## 2018-12-13 DIAGNOSIS — I10 Essential (primary) hypertension: Secondary | ICD-10-CM

## 2018-12-13 NOTE — Telephone Encounter (Signed)
The patient is wanting someone to call her back to discuss her neurologist putting her on gabapentin and now wanting to up the dosage. I had put a note back there last week or the week before, but she said no one called her back.

## 2018-12-13 NOTE — Telephone Encounter (Signed)
Spoke with Teresa Rhodes; her appt with Dr Melrose Nakayama today and him wanting to increase her Gabapentin.  She was concerned that it would violate her chronic pain agreement contract.  I told her that would in no way violate the contract.  Extensive conversation about plan of care here vs neuro vs other providers that she has seen and her desperation to figure out a plan.  She will see Dr Holley Raring in July, earlier appt offered but declined.

## 2018-12-26 ENCOUNTER — Ambulatory Visit
Admission: RE | Admit: 2018-12-26 | Discharge: 2018-12-26 | Disposition: A | Discharge status: Home / Self Care | Payer: 59 | Source: Ambulatory Visit | Attending: Neurology | Admitting: Neurology

## 2018-12-26 ENCOUNTER — Other Ambulatory Visit: Payer: Self-pay

## 2018-12-26 DIAGNOSIS — R51 Headache: Secondary | ICD-10-CM | POA: Diagnosis present

## 2018-12-26 DIAGNOSIS — G8929 Other chronic pain: Secondary | ICD-10-CM

## 2018-12-26 DIAGNOSIS — R519 Headache, unspecified: Secondary | ICD-10-CM

## 2018-12-27 ENCOUNTER — Ambulatory Visit
Admission: RE | Admit: 2018-12-27 | Discharge: 2018-12-27 | Disposition: A | Discharge status: Home / Self Care | Payer: 59 | Source: Ambulatory Visit | Attending: Internal Medicine | Admitting: Internal Medicine

## 2018-12-27 DIAGNOSIS — R1084 Generalized abdominal pain: Secondary | ICD-10-CM | POA: Diagnosis not present

## 2018-12-27 DIAGNOSIS — I10 Essential (primary) hypertension: Secondary | ICD-10-CM | POA: Diagnosis present

## 2018-12-27 MED ORDER — IOHEXOL 300 MG/ML  SOLN
100.0000 mL | Freq: Once | INTRAMUSCULAR | Status: AC | PRN
  Administered 2018-12-27: 100 mL via INTRAVENOUS

## 2019-01-15 DIAGNOSIS — R202 Paresthesia of skin: Secondary | ICD-10-CM | POA: Insufficient documentation

## 2019-01-26 ENCOUNTER — Encounter: Payer: Self-pay | Admitting: Student in an Organized Health Care Education/Training Program

## 2019-02-12 ENCOUNTER — Encounter: Payer: Self-pay | Admitting: Student in an Organized Health Care Education/Training Program

## 2019-02-12 ENCOUNTER — Telehealth: Payer: Self-pay | Admitting: Student in an Organized Health Care Education/Training Program

## 2019-02-12 NOTE — Telephone Encounter (Signed)
Pt called stating she was returning a nurses call about her medications for her appt tomorrow

## 2019-02-12 NOTE — Telephone Encounter (Signed)
Attempted to call patient again and left message.

## 2019-02-13 ENCOUNTER — Ambulatory Visit
Payer: 59 | Attending: Student in an Organized Health Care Education/Training Program | Admitting: Student in an Organized Health Care Education/Training Program

## 2019-02-13 ENCOUNTER — Telehealth: Payer: Self-pay | Admitting: Student in an Organized Health Care Education/Training Program

## 2019-02-13 ENCOUNTER — Other Ambulatory Visit: Payer: Self-pay

## 2019-02-13 ENCOUNTER — Encounter: Payer: Self-pay | Admitting: Student in an Organized Health Care Education/Training Program

## 2019-02-13 DIAGNOSIS — M5416 Radiculopathy, lumbar region: Secondary | ICD-10-CM

## 2019-02-13 DIAGNOSIS — Q761 Klippel-Feil syndrome: Secondary | ICD-10-CM

## 2019-02-13 DIAGNOSIS — M21371 Foot drop, right foot: Secondary | ICD-10-CM

## 2019-02-13 DIAGNOSIS — G8929 Other chronic pain: Secondary | ICD-10-CM

## 2019-02-13 DIAGNOSIS — G894 Chronic pain syndrome: Secondary | ICD-10-CM

## 2019-02-13 DIAGNOSIS — M5442 Lumbago with sciatica, left side: Secondary | ICD-10-CM

## 2019-02-13 DIAGNOSIS — M542 Cervicalgia: Secondary | ICD-10-CM | POA: Diagnosis not present

## 2019-02-13 DIAGNOSIS — M47812 Spondylosis without myelopathy or radiculopathy, cervical region: Secondary | ICD-10-CM

## 2019-02-13 DIAGNOSIS — M5441 Lumbago with sciatica, right side: Secondary | ICD-10-CM

## 2019-02-13 DIAGNOSIS — M961 Postlaminectomy syndrome, not elsewhere classified: Secondary | ICD-10-CM

## 2019-02-13 DIAGNOSIS — Z981 Arthrodesis status: Secondary | ICD-10-CM

## 2019-02-13 MED ORDER — BUPRENORPHINE 7.5 MCG/HR TD PTWK: 7.5000 ug/h | MEDICATED_PATCH | TRANSDERMAL | 0 refills | Status: DC

## 2019-02-13 NOTE — Telephone Encounter (Signed)
Pt called stating her pain patch needs PA

## 2019-02-13 NOTE — Progress Notes (Signed)
Pain Management Virtual Encounter Note - Virtual Visit via Telephone Telehealth (real-time audio visits between healthcare provider and patient).   Patient's Phone No. & Preferred Pharmacy:  [phone] (home); [phone] (mobile); (Preferred) [phone] [email or web address], [name] [address] Phone: [phone] Fax: [phone]    Pre-screening note:  Our staff contacted Teresa Rhodes and offered her an "in person", "face-to-face" appointment versus a telephone encounter. She indicated preferring the telephone encounter, at this time.   Reason for Virtual Visit: COVID-19*  Social distancing based on CDC and AMA recommendations.   I contacted Teresa Rhodes on 02/13/2019 via telephone.      I clearly identified myself as Gillis Santa, MD. I verified that I was speaking with the correct person using two identifiers (Name: Teresa Rhodes, and date of birth: Apr 11, 1971).  Advanced Informed Consent I sought verbal advanced consent from Teresa Rhodes for virtual visit interactions. I informed Teresa Rhodes of possible security and privacy concerns, risks, and limitations associated with providing "not-in-person" medical evaluation and management services. I also informed Teresa Rhodes of the availability of "in-person" appointments. Finally, I informed her that there would be a charge for the virtual visit and that she could be  personally, fully or partially, financially responsible for it. Teresa Rhodes expressed understanding and agreed to proceed.   Historic Elements   Teresa Rhodes is a 48 y.o. year old, female patient evaluated today after her last encounter by our practice on 02/12/2019. Teresa Rhodes  has a past medical history of Complication of anesthesia, Depression, Gallstones, Hypertension, Migraine, and Seizures ([place]) (2010-2013). She also  has a past surgical history that includes Cholecystectomy  (2002); Neck surgery (2005 & 2009); Back surgery; Esophagogastroduodenoscopy (egd) with propofol (N/A, 04/10/2017); and Colonoscopy with propofol (N/A, 04/10/2017). Teresa Rhodes has a current medication list which includes the following prescription(s): amphetamine-dextroamphetamine, benazepril-hydrochlorthiazide, diazepam, gabapentin, emgality, hydrocodone-acetaminophen, omeprazole, quetiapine, vitamin d (ergocalciferol), amphetamine-dextroamphetamine, and buprenorphine. She  reports that she quit smoking about 4 years ago. Her smoking use included cigarettes. She has a 1.50 pack-year smoking history. She has never used smokeless tobacco. She reports that she does not drink alcohol or use drugs. Teresa Rhodes is allergic to robaxin [methocarbamol].   HPI  Today, she is being contacted for medication management.   Patient continues to endorse neck pain, shoulder pain, radiating bilateral arm pain.  She also endorses persistent headaches.  She has been seen by her neurologist.  She is being managed on gabapentin.  She has discontinued her muscle relaxants which she states has helped out her headaches to a small extent.  We discussed how chronic opioid therapy can result in headache as well as increased migraine days.  I informed her that this is a risk-benefit analysis as patient states hydrocodone does help her neck pain and low back pain.  She does have a history of ACDF as well as lumbar spine surgery.  Patient also has severe anxiety which I think is also contributing to her headaches and chronic pain experience.  We discussed interventional options including cervical epidural steroid injection as well as diagnostic cervical facet medial branch nerve blocks for cervical spondylosis.  Patient's neuroforaminal stenosis is at C4-C5 and her fusion is at C5-C6 and C6 and C7.  Patient is at high risk of having complications from epidural if injection is performed at C4-C5 given decrease in cervical canal caliber at that  level.  However  for the patient cervical spondylosis which could be contributing to her neck and shoulder pain, we discussed diagnostic cervical facet medial branch nerve blocks.  Risks and benefits reviewed and patient like to proceed.  We also discussed an alternative to hydrocodone to see if this would impact her headaches.  We discussed buprenorphine pain therapy.  Patient has tried hydrocodone, tramadol, oxycodone in the past which resulted in side effects of headaches and only moderately helped her neck and low back pain.  Pharmacotherapy Assessment   02/02/2019  1   11/13/2018  Hydrocodone-Acetamin 5-325 MG  90.00 30 Cr Kin   [id number]   Thr (4878)   0  15.00 MME  Comm Ins      Monitoring: Pharmacotherapy: No side-effects or adverse reactions reported. [place] PMP: PDMP reviewed during this encounter.       Compliance: No problems identified. Effectiveness: Clinically acceptable. Plan: Refer to "POC".  Pertinent Labs   SAFETY SCREENING Profile Lab Results  Component Value Date   STAPHAUREUS NEGATIVE 09/03/2015   MRSAPCR NEGATIVE 09/03/2015   HIV Non Reactive 04/08/2017   PREGTESTUR NEGATIVE 04/07/2017   Renal Function Lab Results  Component Value Date   BUN 5 (L) 02/11/2018   CREATININE 0.53 02/11/2018   BCR 9 06/12/2014   GFRAA >60 02/11/2018   GFRNONAA >60 02/11/2018   Hepatic Function Lab Results  Component Value Date   AST 27 04/07/2017   ALT 30 04/07/2017   ALBUMIN 4.1 04/07/2017   UDS Summary  Date Value Ref Range Status  05/25/2018 FINAL  Final    Comment:    ==================================================================== TOXASSURE SELECT 13 (MW) ==================================================================== Test                             Result       Flag       Units Drug Present   Amphetamine                    >3922                   ng/mg creat    Amphetamine is available as a schedule II prescription drug.   Oxazepam                        32                      ng/mg creat    Oxazepam may be administered as a scheduled prescription    medication; it is also an expected metabolite of other    benzodiazepine drugs, including diazepam, chlordiazepoxide,    prazepam, clorazepate, halazepam, and temazepam.   Hydrocodone                    1809                    ng/mg creat   Hydromorphone                  189                     ng/mg creat   Dihydrocodeine                 280                     ng/mg creat  Norhydrocodone                 563                     ng/mg creat    Sources of hydrocodone include scheduled prescription    medications. Hydromorphone, dihydrocodeine and norhydrocodone are    expected metabolites of hydrocodone. Hydromorphone and    dihydrocodeine are also available as scheduled prescription    medications. ==================================================================== Test                      Result    Flag   Units      Ref Range   Creatinine              255              mg/dL      >=20 ==================================================================== Declared Medications:  Medication list was not provided. ==================================================================== For clinical consultation, please call [phone]. ====================================================================    Note: Above Lab results reviewed.  Recent imaging  CT ABDOMEN PELVIS W CONTRAST CLINICAL DATA:  Abdominal pain with bloating and swelling for 2 years.  EXAM: CT ABDOMEN AND PELVIS WITH CONTRAST  TECHNIQUE: Multidetector CT imaging of the abdomen and pelvis was performed using the standard protocol following bolus administration of intravenous contrast.  CONTRAST:  156mL OMNIPAQUE IOHEXOL 300 MG/ML  SOLN  COMPARISON:  04/07/2017  FINDINGS: Lower chest: Unremarkable  Hepatobiliary: No suspicious focal abnormality within the liver parenchyma. Gallbladder is surgically absent.  No intrahepatic or extrahepatic biliary dilation.  Pancreas: No focal mass lesion. No dilatation of the main duct. No intraparenchymal cyst. No peripancreatic edema.  Spleen: No splenomegaly. No focal mass lesion.  Adrenals/Urinary Tract: No adrenal nodule or mass. Kidneys unremarkable. No evidence for hydroureter. The urinary bladder appears normal for the degree of distention.  Stomach/Bowel: Stomach is unremarkable. No gastric wall thickening. No evidence of outlet obstruction. Duodenum is normally positioned as is the ligament of Treitz. No small bowel wall thickening. No small bowel dilatation. The terminal ileum is normal. No gross colonic mass. No colonic wall thickening.  Vascular/Lymphatic: No abdominal aortic aneurysm. No abdominal aortic atherosclerotic calcification. There is no gastrohepatic or hepatoduodenal ligament lymphadenopathy. No intraperitoneal or retroperitoneal lymphadenopathy. No pelvic sidewall lymphadenopathy.  Reproductive: The uterus is unremarkable.  There is no adnexal mass.  Other: No intraperitoneal free fluid.  Musculoskeletal: Tiny umbilical hernia contains only fat. No worrisome lytic or sclerotic osseous abnormality. Status post lumbar fusion  IMPRESSION: 1. Unremarkable CT scan of the abdomen and pelvis. Specifically, no findings to explain the patient's history of abdominal pain with bloating and swelling. 2. Tiny umbilical hernia contains only fat.  Electronically Signed   By: Misty Stanley M.D.   On: 12/27/2018 16:01  Assessment  The primary encounter diagnosis was Cervical spondylosis. Diagnoses of Chronic neck pain, History of fusion of lumbar spine, Right foot drop, Failed back surgical syndrome, Chronic bilateral low back pain with bilateral sciatica, Lumbar radiculopathy, Chronic pain syndrome, and Cervical fusion syndrome were also pertinent to this visit.  Plan of Care  I have discontinued Newt Lukes. [place] SUMAtriptan,  amLODipine, traZODone, nortriptyline, and tiZANidine. I am also having her start on Buprenorphine. Additionally, I am having her maintain her diazepam, Vitamin D (Ergocalciferol), omeprazole, amphetamine-dextroamphetamine, amphetamine-dextroamphetamine, HYDROcodone-acetaminophen, gabapentin, QUEtiapine, Emgality, and benazepril-hydrochlorthiazide.  Pharmacotherapy (Medications Ordered): Meds ordered this encounter  Medications  . Buprenorphine 7.5 MCG/HR PTWK    Sig: Place  7.5 mcg/hr onto the skin every 7 (seven) days.    Dispense:  4 patch    Refill:  0    Do not place this medication, or any other prescription from our practice, on "Automatic Refill". Patient may have prescription filled one day early if pharmacy is closed on scheduled refill date.   Orders:  Orders Placed This Encounter  Procedures  . CERVICAL FACET (MEDIAL BRANCH NERVE BLOCK)     Standing Status:   Future    Standing Expiration Date:   03/16/2019    Scheduling Instructions:     Side: Bilateral     Level:C4-5, C5-6 Facet joints (C4, C5, C6,Medial Branch Nerves)     Sedation: With Sedation.     Timeframe: As soon as schedule allows    Order Specific Question:   Where will this procedure be performed?    Answer:   [facility]   Follow-up plan:   Return for Procedure Bilateral C4-C5-C6 facets with sedation.     Given increase in headache days, trial of buprenorphine.  Instructed patient to reduce hydrocodone intake.  Plan for diagnostic cervical facet blocks for cervical spondylosis   Recent Visits No visits were found meeting these conditions.  Showing recent visits within past 90 days and meeting all other requirements   Today's Visits Date Type Provider Dept  02/13/19 Office Visit Gillis Santa, MD [facility]  Showing today's visits and meeting all other requirements   Future Appointments No visits were found meeting these conditions.  Showing future appointments within next 90 days and  meeting all other requirements   I discussed the assessment and treatment plan with the patient. The patient was provided an opportunity to ask questions and all were answered. The patient agreed with the plan and demonstrated an understanding of the instructions.  Patient advised to call back or seek an in-person evaluation if the symptoms or condition worsens.  Total duration of non-face-to-face encounter: 34minutes.  Note by: Gillis Santa, MD Date: 02/13/2019; Time: 10:16 AM  Note: This dictation was prepared with Dragon dictation. Any transcriptional errors that may result from this process are unintentional.  Disclaimer:  * Given the special circumstances of the COVID-19 pandemic, the federal government has announced that the Office for Civil Rights (OCR) will exercise its enforcement discretion and will not impose penalties on physicians using telehealth in the event of noncompliance with regulatory requirements under the [place] (HIPAA) in connection with the good faith provision of telehealth during the ANVBT-66 national public health emergency. ([place])

## 2019-02-15 ENCOUNTER — Encounter: Payer: Self-pay | Admitting: Student in an Organized Health Care Education/Training Program

## 2019-02-15 MED ORDER — BUPRENORPHINE 7.5 MCG/HR TD PTWK: 7.5000 ug/h | MEDICATED_PATCH | TRANSDERMAL | 0 refills | Status: DC

## 2019-02-20 ENCOUNTER — Telehealth: Payer: Self-pay | Admitting: Student in an Organized Health Care Education/Training Program

## 2019-02-20 NOTE — Telephone Encounter (Signed)
I do not have any other suitable alternatives for her

## 2019-02-20 NOTE — Telephone Encounter (Signed)
Patient states she cannot afford the Buprenorphine Patch. It cost $150 after insurance. Is there something else you can write for her.

## 2019-02-21 ENCOUNTER — Encounter: Payer: Self-pay | Admitting: Student in an Organized Health Care Education/Training Program

## 2019-02-21 NOTE — Telephone Encounter (Signed)
Patient asks if she can be put back on Hydrocodone. She is going to contact the pharmaceutical company to see if they can help her get medication cheaper than $150 copay

## 2019-02-23 ENCOUNTER — Encounter: Payer: Self-pay | Admitting: Student in an Organized Health Care Education/Training Program

## 2019-02-28 ENCOUNTER — Other Ambulatory Visit: Payer: Self-pay

## 2019-02-28 ENCOUNTER — Ambulatory Visit
Admission: RE | Admit: 2019-02-28 | Discharge: 2019-02-28 | Disposition: A | Discharge status: Home / Self Care | Payer: 59 | Source: Ambulatory Visit | Attending: Student in an Organized Health Care Education/Training Program | Admitting: Student in an Organized Health Care Education/Training Program

## 2019-02-28 ENCOUNTER — Encounter: Payer: Self-pay | Admitting: Student in an Organized Health Care Education/Training Program

## 2019-02-28 ENCOUNTER — Ambulatory Visit (HOSPITAL_BASED_OUTPATIENT_CLINIC_OR_DEPARTMENT_OTHER): Payer: 59 | Admitting: Student in an Organized Health Care Education/Training Program

## 2019-02-28 DIAGNOSIS — M47812 Spondylosis without myelopathy or radiculopathy, cervical region: Secondary | ICD-10-CM | POA: Diagnosis not present

## 2019-02-28 MED ORDER — DEXAMETHASONE SODIUM PHOSPHATE 10 MG/ML IJ SOLN
10.0000 mg | Freq: Once | INTRAMUSCULAR | Status: AC
  Administered 2019-02-28: 10 mg

## 2019-02-28 MED ORDER — DEXAMETHASONE SODIUM PHOSPHATE 10 MG/ML IJ SOLN
INTRAMUSCULAR | Status: AC
  Filled 2019-02-28: qty 2

## 2019-02-28 MED ORDER — FENTANYL CITRATE (PF) 100 MCG/2ML IJ SOLN
25.0000 ug | INTRAMUSCULAR | Status: DC | PRN
  Administered 2019-02-28: 100 ug via INTRAVENOUS

## 2019-02-28 MED ORDER — ROPIVACAINE HCL 2 MG/ML IJ SOLN
1.0000 mL | Freq: Once | INTRAMUSCULAR | Status: AC
  Administered 2019-02-28: 10 mL via EPIDURAL

## 2019-02-28 MED ORDER — LIDOCAINE HCL 2 % IJ SOLN
20.0000 mL | Freq: Once | INTRAMUSCULAR | Status: AC
  Administered 2019-02-28: 12:00:00 400 mg

## 2019-02-28 MED ORDER — FENTANYL CITRATE (PF) 100 MCG/2ML IJ SOLN
INTRAMUSCULAR | Status: AC
  Filled 2019-02-28: qty 2

## 2019-02-28 MED ORDER — MIDAZOLAM HCL 5 MG/5ML IJ SOLN
INTRAMUSCULAR | Status: AC
  Filled 2019-02-28: qty 5

## 2019-02-28 MED ORDER — LIDOCAINE HCL 2 % IJ SOLN
INTRAMUSCULAR | Status: AC
  Filled 2019-02-28: qty 20

## 2019-02-28 MED ORDER — ROPIVACAINE HCL 2 MG/ML IJ SOLN
INTRAMUSCULAR | Status: AC
  Filled 2019-02-28: qty 10

## 2019-02-28 MED ORDER — ROPIVACAINE HCL 2 MG/ML IJ SOLN: 2.0000 mL | Freq: Once | INTRAMUSCULAR | Status: DC

## 2019-02-28 MED ORDER — MIDAZOLAM HCL 5 MG/5ML IJ SOLN: 1.0000 mg | INTRAMUSCULAR | Status: DC | PRN

## 2019-02-28 NOTE — Patient Instructions (Signed)

## 2019-02-28 NOTE — Progress Notes (Signed)
Safety precautions to be maintained throughout the outpatient stay will include: orient to surroundings, keep bed in low position, maintain call bell within reach at all times, provide assistance with transfer out of bed and ambulation.  

## 2019-02-28 NOTE — Progress Notes (Signed)
Patient's Name: Teresa Rhodes  MRN: [id number]  Referring Provider: Gillis Santa, MD  DOB: [birth year]  PCP: Idelle Crouch, MD  DOS: 02/28/2019  Note by: Gillis Santa, MD  Service setting: Ambulatory outpatient  Specialty: Interventional Pain Management  Patient type: Established  Location: [facility] (AMB) Pain Management Facility  Visit type: Interventional Procedure   Primary Reason for Visit: Interventional Pain Management Treatment. CC: Back Pain (all of her back down to her feet)  Procedure:          Anesthesia, Analgesia, Anxiolysis:  Type: Cervical Facet Medial Branch Block(s)  #1  Primary Purpose: Diagnostic Region: Posterolateral cervical spine Level:  C4, C5, C6, Medial Branch Level(s). Injecting these levels blocks the  C4-5, C5-6,  cervical facet joints. Laterality: Bilateral  Type: Moderate (Conscious) Sedation combined with Local Anesthesia Indication(s): Analgesia and Anxiety Route: Intravenous (IV) IV Access: Secured Sedation: Meaningful verbal contact was maintained at all times during the procedure  Local Anesthetic: Lidocaine 1-2%  Position: Prone with head of the table raised to facilitate breathing.   Indications: 1. Cervical spondylosis    Pain Score: Pre-procedure: 3 /10 Post-procedure: 2 /10   Pre-op Assessment:  Teresa Rhodes is a 48 y.o. (year old), female patient, seen today for interventional treatment. She  has a past surgical history that includes Cholecystectomy (2002); Neck surgery (2005 & 2009); Back surgery; Esophagogastroduodenoscopy (egd) with propofol (N/A, 04/10/2017); and Colonoscopy with propofol (N/A, 04/10/2017). Teresa Rhodes has a current medication list which includes the following prescription(s): amphetamine-dextroamphetamine, amphetamine-dextroamphetamine, benazepril-hydrochlorthiazide, buprenorphine, diazepam, gabapentin, emgality, omeprazole, quetiapine, and vitamin d (ergocalciferol), and the following Facility-Administered Medications:  fentanyl, midazolam, and ropivacaine (pf) 2 mg/ml (0.2%). Her primarily concern today is the Back Pain (all of her back down to her feet)  Initial Vital Signs:  Pulse/HCG Rate: 98ECG Heart Rate: 100 Temp: 98.3 F (36.8 C) Resp: 18 BP: 128/83 SpO2: 100 %  BMI: Estimated body mass index is 29.24 kg/m as calculated from the following:   Height as of this encounter: 5\' 9"  (1.753 m).   Weight as of this encounter: 198 lb (89.8 kg).  Risk Assessment: Allergies: Reviewed. She is allergic to robaxin [methocarbamol].  Allergy Precautions: None required Coagulopathies: Reviewed. None identified.  Blood-thinner therapy: None at this time Active Infection(s): Reviewed. None identified. Teresa Rhodes is afebrile  Site Confirmation: Teresa Rhodes was asked to confirm the procedure and laterality before marking the site Procedure checklist: Completed Consent: Before the procedure and under the influence of no sedative(s), amnesic(s), or anxiolytics, the patient was informed of the treatment options, risks and possible complications. To fulfill our ethical and legal obligations, as recommended by the American Medical Association's Code of Ethics, I have informed the patient of my clinical impression; the nature and purpose of the treatment or procedure; the risks, benefits, and possible complications of the intervention; the alternatives, including doing nothing; the risk(s) and benefit(s) of the alternative treatment(s) or procedure(s); and the risk(s) and benefit(s) of doing nothing. The patient was provided information about the general risks and possible complications associated with the procedure. These may include, but are not limited to: failure to achieve desired goals, infection, bleeding, organ or nerve damage, allergic reactions, paralysis, and death. In addition, the patient was informed of those risks and complications associated to Spine-related procedures, such as failure to decrease pain; infection  (i.e.: Meningitis, epidural or intraspinal abscess); bleeding (i.e.: epidural hematoma, subarachnoid hemorrhage, or any other type of intraspinal or peri-dural bleeding); organ or nerve damage (i.e.: Any  type of peripheral nerve, nerve root, or spinal cord injury) with subsequent damage to sensory, motor, and/or autonomic systems, resulting in permanent pain, numbness, and/or weakness of one or several areas of the body; allergic reactions; (i.e.: anaphylactic reaction); and/or death. Furthermore, the patient was informed of those risks and complications associated with the medications. These include, but are not limited to: allergic reactions (i.e.: anaphylactic or anaphylactoid reaction(s)); adrenal axis suppression; blood sugar elevation that in diabetics may result in ketoacidosis or comma; water retention that in patients with history of congestive heart failure may result in shortness of breath, pulmonary edema, and decompensation with resultant heart failure; weight gain; swelling or edema; medication-induced neural toxicity; particulate matter embolism and blood vessel occlusion with resultant organ, and/or nervous system infarction; and/or aseptic necrosis of one or more joints. Finally, the patient was informed that Medicine is not an exact science; therefore, there is also the possibility of unforeseen or unpredictable risks and/or possible complications that may result in a catastrophic outcome. The patient indicated having understood very clearly. We have given the patient no guarantees and we have made no promises. Enough time was given to the patient to ask questions, all of which were answered to the patient's satisfaction. Teresa Rhodes has indicated that she wanted to continue with the procedure. Attestation: I, the ordering provider, attest that I have discussed with the patient the benefits, risks, side-effects, alternatives, likelihood of achieving goals, and potential problems during recovery  for the procedure that I have provided informed consent. Date   Time: 02/28/2019 11:26 AM  Pre-Procedure Preparation:  Monitoring: As per clinic protocol. Respiration, ETCO2, SpO2, BP, heart rate and rhythm monitor placed and checked for adequate function Safety Precautions: Patient was assessed for positional comfort and pressure points before starting the procedure. Time-out: I initiated and conducted the "Time-out" before starting the procedure, as per protocol. The patient was asked to participate by confirming the accuracy of the "Time Out" information. Verification of the correct person, site, and procedure were performed and confirmed by me, the nursing staff, and the patient. "Time-out" conducted as per Joint Commission's Universal Protocol (UP.01.01.01). Time: 1211  Description of Procedure:          Laterality: Bilateral. The procedure was performed in identical fashion on both sides. Level:  C4, C5, C6, Medial Branch Level(s). Area Prepped: Posterior Cervico-thoracic Region Prepping solution: DuraPrep (Iodine Povacrylex [0.7% available iodine] and Isopropyl Alcohol, 74% w/w) Safety Precautions: Aspiration looking for blood return was conducted prior to all injections. At no point did we inject any substances, as a needle was being advanced. Before injecting, the patient was told to immediately notify me if she was experiencing any new onset of "ringing in the ears, or metallic taste in the mouth". No attempts were made at seeking any paresthesias. Safe injection practices and needle disposal techniques used. Medications properly checked for expiration dates. SDV (single dose vial) medications used. After the completion of the procedure, all disposable equipment used was discarded in the proper designated medical waste containers. Local Anesthesia: Protocol guidelines were followed. The patient was positioned over the fluoroscopy table. The area was prepped in the usual manner. The time-out  was completed. The target area was identified using fluoroscopy. A 12-in long, straight, sterile hemostat was used with fluoroscopic guidance to locate the targets for each level blocked. Once located, the skin was marked with an approved surgical skin marker. Once all sites were marked, the skin (epidermis, dermis, and hypodermis), as well as deeper tissues (  fat, connective tissue and muscle) were infiltrated with a small amount of a short-acting local anesthetic, loaded on a 10cc syringe with a 25G, 1.5-in  Needle. An appropriate amount of time was allowed for local anesthetics to take effect before proceeding to the next step. Local Anesthetic: Lidocaine 2.0% The unused portion of the local anesthetic was discarded in the proper designated containers. Technical explanation of process:  C4 Medial Branch Nerve Block (MBB): The target area for the C4 dorsal medial articular branch is the lateral concave waist of the articular pillar of C4. Under fluoroscopic guidance, a Quincke needle was inserted until contact was made with os over the postero-lateral aspect of the articular pillar of C4 (target area). After negative aspiration for blood, 1.5 mL of the nerve block solution was injected without difficulty or complication. The needle was removed intact. C5 Medial Branch Nerve Block (MBB): The target area for the C5 dorsal medial articular branch is the lateral concave waist of the articular pillar of C5. Under fluoroscopic guidance, a Quincke needle was inserted until contact was made with os over the postero-lateral aspect of the articular pillar of C5 (target area). After negative aspiration for blood, 1.5 mL of the nerve block solution was injected without difficulty or complication. The needle was removed intact. C6 Medial Branch Nerve Block (MBB): The target area for the C6 dorsal medial articular branch is the lateral concave waist of the articular pillar of C6. Under fluoroscopic guidance, a Quincke  needle was inserted until contact was made with os over the postero-lateral aspect of the articular pillar of C6 (target area). After negative aspiration for blood, 1.5  mL of the nerve block solution was injected without difficulty or complication. The needle was removed intact.  Procedural Needles: 22-gauge, 3.5-inch, Quincke needles used for all levels. Nerve block solution: 10 cc solution made of 8 cc of 0.2% ropivacaine, 2 cc of Decadron 10 mg/cc.  1.5 cc injected at each level above bilaterally.  The unused portion of the solution was discarded in the proper designated containers.  Once the entire procedure was completed, the treated area was cleaned, making sure to leave some of the prepping solution back to take advantage of its long term bactericidal properties.  Anatomy Reference Guide:       Vitals:   02/28/19 1222 02/28/19 1228 02/28/19 1231 02/28/19 1238  BP: 114/71 117/75 125/78 123/68  Pulse:      Resp: 12 11 14 20   Temp:    97.8 F (36.6 C)  TempSrc:    Temporal  SpO2: 99% 98% 97% 99%  Weight:      Height:        Start Time: 1211 hrs. End Time: 1230 hrs.  Imaging Guidance (Spinal):          Type of Imaging Technique: Fluoroscopy Guidance (Spinal) Indication(s): Assistance in needle guidance and placement for procedures requiring needle placement in or near specific anatomical locations not easily accessible without such assistance. Exposure Time: Please see nurses notes. Contrast: None used. Fluoroscopic Guidance: I was personally present during the use of fluoroscopy. "Tunnel Vision Technique" used to obtain the best possible view of the target area. Parallax error corrected before commencing the procedure. "Direction-depth-direction" technique used to introduce the needle under continuous pulsed fluoroscopy. Once target was reached, antero-posterior, oblique, and lateral fluoroscopic projection used confirm needle placement in all planes. Images permanently stored in  EMR. Interpretation: No contrast injected. I personally interpreted the imaging intraoperatively. Adequate needle placement confirmed in  multiple planes. Permanent images saved into the patient's record.  Antibiotic Prophylaxis:   Anti-infectives (From admission, onward)   None     Indication(s): None identified  Post-operative Assessment:  Post-procedure Vital Signs:  Pulse/HCG Rate: 9890 Temp: 97.8 F (36.6 C) Resp: 20 BP: 123/68 SpO2: 99 %  EBL: None  Complications: No immediate post-treatment complications observed by team, or reported by patient.  Note: The patient tolerated the entire procedure well. A repeat set of vitals were taken after the procedure and the patient was kept under observation following institutional policy, for this type of procedure. Post-procedural neurological assessment was performed, showing return to baseline, prior to discharge. The patient was provided with post-procedure discharge instructions, including a section on how to identify potential problems. Should any problems arise concerning this procedure, the patient was given instructions to immediately contact us, at any time, without hesitation. In any case, we plan to contact the patient by telephone for a follow-up status report regarding this interventional procedure.  Comments:  No additional relevant information.  Plan of Care  Orders:  Orders Placed This Encounter  Procedures   DG PAIN CLIN[facility] C-ARM 1-60 MIN NO REPORT    Intraoperative interpretation by procedural physician at [place].    Standing Status:   Standing    Number of Occurrences:   1    Order Specific Question:   Reason for exam:    Answer:   Assistance in needle guidance and placement for procedures requiring needle placement in or near specific anatomical locations not easily accessible without such assistance.    Medications ordered for procedure: Meds ordered this encounter  Medications   lidocaine  (XYLOCAINE) 2 % (with pres) injection 400 mg   midazolam (VERSED) 5 MG/5ML injection 1-2 mg    Make sure Flumazenil is available in the pyxis when using this medication. If oversedation occurs, administer 0.2 mg IV over 15 sec. If after 45 sec no response, administer 0.2 mg again over 1 min; may repeat at 1 min intervals; not to exceed 4 doses (1 mg)   fentaNYL (SUBLIMAZE) injection 25-50 mcg    Make sure Narcan is available in the pyxis when using this medication. In the event of respiratory depression (RR< 8/min): Titrate NARCAN (naloxone) in increments of 0.1 to 0.2 mg IV at 2-3 minute intervals, until desired degree of reversal.   ropivacaine (PF) 2 mg/mL (0.2%) (NAROPIN) injection 1 mL   dexamethasone (DECADRON) injection 10 mg   dexamethasone (DECADRON) injection 10 mg   ropivacaine (PF) 2 mg/mL (0.2%) (NAROPIN) injection 2 mL   Medications administered: We administered lidocaine, fentaNYL, ropivacaine (PF) 2 mg/mL (0.2%), dexamethasone, and dexamethasone.  See the medical record for exact dosing, route, and time of administration.  Follow-up plan:   Return for Keep 8/27 appt.      s/p diagnostic cervical facet medial branch nerve blocks at C4, C5, C6 given cervicogenic headaches    Recent Visits Date Type Provider Dept  02/13/19 Office Visit Gillis Santa, MD [facility]  Showing recent visits within past 90 days and meeting all other requirements   Today's Visits Date Type Provider Dept  02/28/19 Procedure visit Gillis Santa, MD [facility]  Showing today's visits and meeting all other requirements   Future Appointments Date Type Provider Dept  03/15/19 Appointment Gillis Santa, MD [facility]  Showing future appointments within next 90 days and meeting all other requirements   Disposition: Discharge home  Discharge Date & Time: 02/28/2019;  hrs.   Primary Care Physician: Idelle Crouch, MD Location: [facility]  Facility Note by: Gillis Santa, MD Date: 02/28/2019; Time: 12:46 PM  Disclaimer:  Medicine is not an exact science. The only guarantee in medicine is that nothing is guaranteed. It is important to note that the decision to proceed with this intervention was based on the information collected from the patient. The Data and conclusions were drawn from the patient's questionnaire, the interview, and the physical examination. Because the information was provided in large part by the patient, it cannot be guaranteed that it has not been purposely or unconsciously manipulated. Every effort has been made to obtain as much relevant data as possible for this evaluation. It is important to note that the conclusions that lead to this procedure are derived in large part from the available data. Always take into account that the treatment will also be dependent on availability of resources and existing treatment guidelines, considered by other Pain Management Practitioners as being common knowledge and practice, at the time of the intervention. For Medico-Legal purposes, it is also important to point out that variation in procedural techniques and pharmacological choices are the acceptable norm. The indications, contraindications, technique, and results of the above procedure should only be interpreted and judged by a Board-Certified Interventional Pain Specialist with extensive familiarity and expertise in the same exact procedure and technique.

## 2019-03-01 ENCOUNTER — Telehealth: Payer: Self-pay | Admitting: *Deleted

## 2019-03-01 NOTE — Telephone Encounter (Signed)
Called for post procedure check. States headache this AM and neck feels "tight". Denies fever. Encouraged heat 15 min on/ 15 min off to help with tightness. Call for any issues if needed.

## 2019-03-09 ENCOUNTER — Encounter: Payer: Self-pay | Admitting: Student in an Organized Health Care Education/Training Program

## 2019-03-12 DIAGNOSIS — R569 Unspecified convulsions: Secondary | ICD-10-CM | POA: Insufficient documentation

## 2019-03-13 ENCOUNTER — Encounter: Payer: Self-pay | Admitting: Student in an Organized Health Care Education/Training Program

## 2019-03-15 ENCOUNTER — Encounter: Payer: Self-pay | Admitting: Student in an Organized Health Care Education/Training Program

## 2019-03-15 ENCOUNTER — Ambulatory Visit
Payer: 59 | Attending: Student in an Organized Health Care Education/Training Program | Admitting: Student in an Organized Health Care Education/Training Program

## 2019-03-15 ENCOUNTER — Other Ambulatory Visit: Payer: Self-pay

## 2019-03-15 DIAGNOSIS — M5442 Lumbago with sciatica, left side: Secondary | ICD-10-CM

## 2019-03-15 DIAGNOSIS — M21371 Foot drop, right foot: Secondary | ICD-10-CM

## 2019-03-15 DIAGNOSIS — Z981 Arthrodesis status: Secondary | ICD-10-CM | POA: Diagnosis not present

## 2019-03-15 DIAGNOSIS — M961 Postlaminectomy syndrome, not elsewhere classified: Secondary | ICD-10-CM

## 2019-03-15 DIAGNOSIS — M542 Cervicalgia: Secondary | ICD-10-CM | POA: Diagnosis not present

## 2019-03-15 DIAGNOSIS — M47812 Spondylosis without myelopathy or radiculopathy, cervical region: Secondary | ICD-10-CM | POA: Diagnosis not present

## 2019-03-15 DIAGNOSIS — G8929 Other chronic pain: Secondary | ICD-10-CM

## 2019-03-15 DIAGNOSIS — M5416 Radiculopathy, lumbar region: Secondary | ICD-10-CM

## 2019-03-15 DIAGNOSIS — M79604 Pain in right leg: Secondary | ICD-10-CM

## 2019-03-15 DIAGNOSIS — M79605 Pain in left leg: Secondary | ICD-10-CM

## 2019-03-15 DIAGNOSIS — M79601 Pain in right arm: Secondary | ICD-10-CM

## 2019-03-15 DIAGNOSIS — Q761 Klippel-Feil syndrome: Secondary | ICD-10-CM

## 2019-03-15 DIAGNOSIS — M5441 Lumbago with sciatica, right side: Secondary | ICD-10-CM

## 2019-03-15 DIAGNOSIS — G894 Chronic pain syndrome: Secondary | ICD-10-CM

## 2019-03-15 DIAGNOSIS — M79602 Pain in left arm: Secondary | ICD-10-CM

## 2019-03-15 DIAGNOSIS — Z79891 Long term (current) use of opiate analgesic: Secondary | ICD-10-CM

## 2019-03-15 MED ORDER — TRAMADOL HCL 50 MG PO TABS: 50.0000 mg | ORAL_TABLET | Freq: Two times a day (BID) | ORAL | 1 refills | Status: DC | PRN

## 2019-03-15 NOTE — Progress Notes (Signed)
Pain Management Virtual Encounter Note - Virtual Visit via Telephone Telehealth (real-time audio visits between healthcare provider and patient).   Patient's Phone No. & Preferred Pharmacy:  [phone] (home); [phone] (mobile); (Preferred) [phone] [email or web address]  [place], [place] [address] Phone: [phone] Fax: [phone]    Pre-screening note:  Our staff contacted Ms. Keister and offered her an "in person", "face-to-face" appointment versus a telephone encounter. She indicated preferring the telephone encounter, at this time.   Reason for Virtual Visit: COVID-19*  Social distancing based on CDC and AMA recommendations.   I contacted [name] on 03/15/2019 via telephone.      I clearly identified myself as Gillis Santa, MD. I verified that I was speaking with the correct person using two identifiers (Name: SABREEN RIEDESEL, and date of birth: [birth year]).  Advanced Informed Consent I sought verbal advanced consent from [name] for virtual visit interactions. I informed Ms. Pribnow of possible security and privacy concerns, risks, and limitations associated with providing "not-in-person" medical evaluation and management services. I also informed Ms. Newsum of the availability of "in-person" appointments. Finally, I informed her that there would be a charge for the virtual visit and that she could be  personally, fully or partially, financially responsible for it. Ms. Ritz expressed understanding and agreed to proceed.   Historic Elements   Ms. ALIAYAH BAZIL is a 48 y.o. year old, female patient evaluated today after her last encounter by our practice on 03/01/2019. Ms. Ficklin  has a past medical history of Complication of anesthesia, Depression, Gallstones, Hypertension, Migraine, and Seizures ([place]) (2010-2013). She also  has a past surgical history that includes Cholecystectomy  (2002); Neck surgery (2005 & 2009); Back surgery; Esophagogastroduodenoscopy (egd) with propofol (N/A, 04/10/2017); and Colonoscopy with propofol (N/A, 04/10/2017). Ms. Dunnavant has a current medication list which includes the following prescription(s): amphetamine-dextroamphetamine, amphetamine-dextroamphetamine, benazepril-hydrochlorthiazide, diazepam, emgality, omeprazole, quetiapine, vitamin d (ergocalciferol), and tramadol. She  reports that she quit smoking about 4 years ago. Her smoking use included cigarettes. She has a 1.50 pack-year smoking history. She has never used smokeless tobacco. She reports that she does not drink alcohol or use drugs. Ms. Shibley is allergic to robaxin [methocarbamol].   HPI  Today, she is being contacted for medication management.   Unfortunately the patient did not find any significant benefit with buprenorphine.  In fact she states that it made her pain worse that she had to stop taking it on the third week.  She states that she did have some hydrocodone leftover that has been managing her pain.  She states that her migraine free days have improved ever since she stopped her muscle relaxant.  She has also discontinued her gabapentin which has significantly improved her lower extremity swelling.  Overall she states that her pain is better but she continues to have low back pain with radiation to lower extremity.  I do not want to escalate to a more potent opioid agonist such as oxycodone or hydromorphone.  In fact this could worsen her migraines.  Patient is already tried hydrocodone.  We discussed tramadol 100 mg twice daily as needed.  Patient would like to try this.  Pharmacotherapy Assessment  Analgesic: Hydrocodone 5 mg 3 times daily PRN, quantity 90/month.  Has also tried buprenorphine at 7.5 which worsened her pain.  We will try hydrocodone. Monitoring: Pharmacotherapy: No side-effects or adverse reactions reported. [place] PMP: PDMP reviewed during this  encounter.        Compliance: No problems identified. Effectiveness: Clinically acceptable. Plan: Refer to "POC".  UDS:  Summary  Date Value Ref Range Status  05/25/2018 FINAL  Final    Comment:    ==================================================================== TOXASSURE SELECT 13 (MW) ==================================================================== Test                             Result       Flag       Units Drug Present   Amphetamine                    >3922                   ng/mg creat    Amphetamine is available as a schedule II prescription drug.   Oxazepam                       32                      ng/mg creat    Oxazepam may be administered as a scheduled prescription    medication; it is also an expected metabolite of other    benzodiazepine drugs, including diazepam, chlordiazepoxide,    prazepam, clorazepate, halazepam, and temazepam.   Hydrocodone                    1809                    ng/mg creat   Hydromorphone                  189                     ng/mg creat   Dihydrocodeine                 280                     ng/mg creat   Norhydrocodone                 563                     ng/mg creat    Sources of hydrocodone include scheduled prescription    medications. Hydromorphone, dihydrocodeine and norhydrocodone are    expected metabolites of hydrocodone. Hydromorphone and    dihydrocodeine are also available as scheduled prescription    medications. ==================================================================== Test                      Result    Flag   Units      Ref Range   Creatinine              255              mg/dL      >=20 ==================================================================== Declared Medications:  Medication list was not provided. ==================================================================== For clinical consultation, please call [phone]. ====================================================================     Laboratory Chemistry Profile (12 mo)  Renal: No results found for requested labs within last 8760 hours.  Lab Results  Component Value Date   GFRAA >60 02/11/2018   GFRNONAA >60 02/11/2018   Hepatic: No results found for requested labs within last 8760 hours. Lab Results  Component Value Date  AST 27 04/07/2017   ALT 30 04/07/2017   Other: No results found for requested labs within last 8760 hours. Note: Above Lab results reviewed.  Imaging  Last 90 days:  Mr Brain Wo Contrast  Result Date: 12/26/2018 CLINICAL DATA:  Chronic intractable headache EXAM: MRI HEAD WITHOUT CONTRAST TECHNIQUE: Multiplanar, multiecho pulse sequences of the brain and surrounding structures were obtained without intravenous contrast. COMPARISON:  MRI head 09/16/2016 FINDINGS: Brain: Ventricle size and cerebral volume normal. Negative for acute infarct. Negative for hemorrhage or mass. Subcortical and deep white matter hyperintensities bilaterally appear unchanged. Normal brainstem and cerebellum. Vascular: Normal arterial flow voids Skull and upper cervical spine: Negative Sinuses/Orbits: Negative Other: None IMPRESSION: No acute abnormality and no change from 2018. Mild-to-moderate subcortical and deep white matter hyperintensities bilaterally are stable. Correlate with risk factors for microvascular ischemia. Differential also includes complex migraine headache and demyelinating disease. Electronically Signed   By: Franchot Gallo M.D.   On: 12/26/2018 11:01   Ct Abdomen Pelvis W Contrast  Result Date: 12/27/2018 CLINICAL DATA:  Abdominal pain with bloating and swelling for 2 years. EXAM: CT ABDOMEN AND PELVIS WITH CONTRAST TECHNIQUE: Multidetector CT imaging of the abdomen and pelvis was performed using the standard protocol following bolus administration of intravenous contrast. CONTRAST:  13mL OMNIPAQUE IOHEXOL 300 MG/ML  SOLN COMPARISON:  04/07/2017 FINDINGS: Lower chest: Unremarkable Hepatobiliary: No  suspicious focal abnormality within the liver parenchyma. Gallbladder is surgically absent. No intrahepatic or extrahepatic biliary dilation. Pancreas: No focal mass lesion. No dilatation of the main duct. No intraparenchymal cyst. No peripancreatic edema. Spleen: No splenomegaly. No focal mass lesion. Adrenals/Urinary Tract: No adrenal nodule or mass. Kidneys unremarkable. No evidence for hydroureter. The urinary bladder appears normal for the degree of distention. Stomach/Bowel: Stomach is unremarkable. No gastric wall thickening. No evidence of outlet obstruction. Duodenum is normally positioned as is the ligament of [place]itz. No small bowel wall thickening. No small bowel dilatation. The terminal ileum is normal. No gross colonic mass. No colonic wall thickening. Vascular/Lymphatic: No abdominal aortic aneurysm. No abdominal aortic atherosclerotic calcification. There is no gastrohepatic or hepatoduodenal ligament lymphadenopathy. No intraperitoneal or retroperitoneal lymphadenopathy. No pelvic sidewall lymphadenopathy. Reproductive: The uterus is unremarkable.  There is no adnexal mass. Other: No intraperitoneal free fluid. Musculoskeletal: Tiny umbilical hernia contains only fat. No worrisome lytic or sclerotic osseous abnormality. Status post lumbar fusion IMPRESSION: 1. Unremarkable CT scan of the abdomen and pelvis. Specifically, no findings to explain the patient's history of abdominal pain with bloating and swelling. 2. Tiny umbilical hernia contains only fat. Electronically Signed   By: Misty Stanley M.D.   On: 12/27/2018 16:01   [facility] C-arm 1-60 Min No Report  Result Date: 02/28/2019 Fluoro was used, but no Radiologist interpretation will be provided. Please refer to "NOTES" tab for provider progress note.   Assessment  The primary encounter diagnosis was Cervical spondylosis. Diagnoses of Chronic neck pain, History of fusion of lumbar spine, Right foot drop, Failed back surgical  syndrome, Chronic bilateral low back pain with bilateral sciatica, Lumbar radiculopathy, Chronic pain syndrome, Cervical fusion syndrome, Chronic pain of both upper extremities, Chronic pain of both lower extremities, and Long term prescription opiate use were also pertinent to this visit.  Plan of Care  I have discontinued Newt Lukes. [place]'s gabapentin and Buprenorphine. I am also having her start on traMADol. Additionally, I am having her maintain her diazepam, Vitamin D (Ergocalciferol), omeprazole, amphetamine-dextroamphetamine, amphetamine-dextroamphetamine, QUEtiapine, Emgality, and benazepril-hydrochlorthiazide.  In regards to  multimodal analgesics, gabapentin at 200 mg 3 times daily resulted in leg swelling.  Patient would like to avoid Lyrica due to side effects that she has heard about.  Would like to avoid Cymbalta as the patient is on Seroquel.  Muscle relaxers worsened her headaches.  Prescription for tramadol as below.  Pharmacotherapy (Medications Ordered): Meds ordered this encounter  Medications  . traMADol (ULTRAM) 50 MG tablet    Sig: Take 1-2 tablets (50-100 mg total) by mouth every 12 (twelve) hours as needed for severe pain.    Dispense:  120 tablet    Refill:  1    Fill one day early if pharmacy is closed on scheduled refill date.   Orders:  No orders of the defined types were placed in this encounter.  Follow-up plan:   Return in about 8 weeks (around 05/10/2019) for Medication Management , virtual.     s/p diagnostic cervical facet medial branch nerve blocks at C4, C5, C6 given cervicogenic headaches: Not effective     Recent Visits Date Type Provider Dept  02/28/19 Procedure visit Gillis Santa, MD [facility]  02/13/19 Office Visit Gillis Santa, MD [facility]  Showing recent visits within past 90 days and meeting all other requirements   Today's Visits Date Type Provider Dept  03/15/19 Office Visit Gillis Santa, MD [facility]  Showing today's visits and meeting all other requirements   Future Appointments No visits were found meeting these conditions.  Showing future appointments within next 90 days and meeting all other requirements   I discussed the assessment and treatment plan with the patient. The patient was provided an opportunity to ask questions and all were answered. The patient agreed with the plan and demonstrated an understanding of the instructions.  Patient advised to call back or seek an in-person evaluation if the symptoms or condition worsens.  Total duration of non-face-to-face encounter: 25 minutes.  Note by: Gillis Santa, MD Date: 03/15/2019; Time: 9:57 AM  Note: This dictation was prepared with Dragon dictation. Any transcriptional errors that may result from this process are unintentional.  Disclaimer:  * Given the special circumstances of the COVID-19 pandemic, the federal government has announced that the Office for Civil Rights (OCR) will exercise its enforcement discretion and will not impose penalties on physicians using telehealth in the event of noncompliance with regulatory requirements under the [place] (HIPAA) in connection with the good faith provision of telehealth during the XX123456 national public health emergency. ([place])

## 2019-04-23 ENCOUNTER — Other Ambulatory Visit: Payer: Self-pay

## 2019-04-23 ENCOUNTER — Ambulatory Visit: Payer: 59 | Admitting: Podiatry

## 2019-04-23 ENCOUNTER — Other Ambulatory Visit: Payer: Self-pay | Admitting: Podiatry

## 2019-04-23 ENCOUNTER — Ambulatory Visit (INDEPENDENT_AMBULATORY_CARE_PROVIDER_SITE_OTHER): Payer: 59

## 2019-04-23 ENCOUNTER — Encounter: Payer: Self-pay | Admitting: Podiatry

## 2019-04-23 VITALS — BP 116/77 | HR 114 | Resp 16

## 2019-04-23 DIAGNOSIS — S9031XA Contusion of right foot, initial encounter: Secondary | ICD-10-CM | POA: Diagnosis not present

## 2019-04-23 DIAGNOSIS — M21371 Foot drop, right foot: Secondary | ICD-10-CM | POA: Diagnosis not present

## 2019-05-09 ENCOUNTER — Telehealth: Payer: Self-pay | Admitting: *Deleted

## 2019-05-09 NOTE — Telephone Encounter (Signed)
Attempted to call for pre appointment review of meds/allergies. Message left. 

## 2019-05-10 ENCOUNTER — Other Ambulatory Visit: Payer: Self-pay

## 2019-05-10 ENCOUNTER — Encounter: Payer: Self-pay | Admitting: Student in an Organized Health Care Education/Training Program

## 2019-05-10 ENCOUNTER — Ambulatory Visit
Payer: 59 | Attending: Student in an Organized Health Care Education/Training Program | Admitting: Student in an Organized Health Care Education/Training Program

## 2019-05-10 DIAGNOSIS — G894 Chronic pain syndrome: Secondary | ICD-10-CM

## 2019-05-10 DIAGNOSIS — M542 Cervicalgia: Secondary | ICD-10-CM

## 2019-05-10 DIAGNOSIS — M961 Postlaminectomy syndrome, not elsewhere classified: Secondary | ICD-10-CM

## 2019-05-10 DIAGNOSIS — M21371 Foot drop, right foot: Secondary | ICD-10-CM | POA: Diagnosis not present

## 2019-05-10 DIAGNOSIS — M5442 Lumbago with sciatica, left side: Secondary | ICD-10-CM

## 2019-05-10 DIAGNOSIS — M5441 Lumbago with sciatica, right side: Secondary | ICD-10-CM

## 2019-05-10 DIAGNOSIS — Z981 Arthrodesis status: Secondary | ICD-10-CM | POA: Diagnosis not present

## 2019-05-10 DIAGNOSIS — M5416 Radiculopathy, lumbar region: Secondary | ICD-10-CM

## 2019-05-10 DIAGNOSIS — M47812 Spondylosis without myelopathy or radiculopathy, cervical region: Secondary | ICD-10-CM | POA: Diagnosis not present

## 2019-05-10 DIAGNOSIS — G8929 Other chronic pain: Secondary | ICD-10-CM

## 2019-05-10 NOTE — Progress Notes (Signed)
Subjective:   Patient ID: Teresa Rhodes, female   DOB: 48 y.o.   MRN: [id number]   HPI 48 year old female presents today for concerns of increased nerve pain to her right foot.  She states that since I last saw her she had another surgery for her back in February 2017.  She has had troponin rise and she wears a brace which she does not like to wear.  She states that she has more sharp, electrical sensations to her foot as well as a freezing sensation to her foot.  She states that she has discussed this with her other doctors as well.   Review of Systems  All other systems reviewed and are negative.  Past Medical History:  Diagnosis Date  . Complication of anesthesia    states she woke up during  neck surgery  . Depression   . Gallstones   . Hypertension   . Migraine   . Seizures ([place]) 2010-2013   Brain seizures    Past Surgical History:  Procedure Laterality Date  . BACK SURGERY    . CHOLECYSTECTOMY  2002  . COLONOSCOPY WITH PROPOFOL N/A 04/10/2017   Procedure: COLONOSCOPY WITH PROPOFOL;  Surgeon: Jonathon Bellows, MD;  Location: [facility] ENDOSCOPY;  Service: Gastroenterology;  Laterality: N/A;  . ESOPHAGOGASTRODUODENOSCOPY (EGD) WITH PROPOFOL N/A 04/10/2017   Procedure: ESOPHAGOGASTRODUODENOSCOPY (EGD) WITH PROPOFOL;  Surgeon: Jonathon Bellows, MD;  Location: [facility] ENDOSCOPY;  Service: Gastroenterology;  Laterality: N/A;  . NECK SURGERY  2005 & 2009   x2     Current Outpatient Medications:  .  amphetamine-dextroamphetamine (ADDERALL) 20 MG tablet, TAKE 1 TABLET BY MOUTH TWICE DAILY, Disp: , Rfl:  .  benazepril-hydrochlorthiazide (LOTENSIN HCT) 20-12.5 MG tablet, Take 1 tablet by mouth 2 (two) times a day., Disp: , Rfl:  .  diazepam (VALIUM) 5 MG tablet, Take 5 mg by mouth every 12 (twelve) hours as needed for anxiety. , Disp: , Rfl:  .  Galcanezumab-gnlm (EMGALITY) 120 MG/ML SOAJ, Inject 120 mg into the skin every 30 (thirty) days., Disp: , Rfl:  .  omeprazole (PRILOSEC) 40 MG capsule,  Take by mouth daily., Disp: , Rfl: 3 .  Vitamin D, Ergocalciferol, (DRISDOL) 1.25 MG (50000 UT) CAPS capsule, TAKE ONE CAPSULE BY MOUTH ONE TIME PER WEEK, Disp: , Rfl: 11  Allergies  Allergen Reactions  . Robaxin [Methocarbamol] Other (See Comments)    Made skin red and hot and itchy        Objective:  Physical Exam  General: AAO x3, NAD  Dermatological: Skin is warm, dry and supple bilateral. Nails x 10 are well manicured; remaining integument appears unremarkable at this time. There are no open sores, no preulcerative lesions, no rash or signs of infection present.  Vascular: Dorsalis Pedis artery and Posterior Tibial artery pedal pulses are 2/4 bilateral with immedate capillary fill time.  There is no pain with calf compression, swelling, warmth, erythema.   Neruologic: Sensation decreased in the right side.  She is describing sharp, nerve pain to her right foot.  Musculoskeletal: Dropfoot present on the right side.  No erythema or tenderness.  Muscular strength 5/5 in all groups tested bilateral.  Gait: Unassisted, Nonantalgic.       Assessment:   Right dropfoot, neuritis    Plan:  -Treatment options discussed including all alternatives, risks, and complications -Etiology of symptoms were discussed -X-rays were obtained and reviewed with the patient. No evidence of acute fracture. -We will discussion regards to treatment options.  She is already  being seen by pain management for this.  I did have Rick evaluate her brace today.  She states that her current brace is not comfortable.  Evaluate to see if there is any further bracing options for her.  We discussed alternative treatments for nerve pain including neurogenix or possibly even a bioness brace.   Trula Slade DPM

## 2019-05-10 NOTE — Progress Notes (Signed)
Pain Management Virtual Encounter Note - Virtual Visit via Telephone Telehealth (real-time audio visits between healthcare provider and patient).   Patient's Phone No. & Preferred Pharmacy:  [phone] (home); [phone] (mobile); (Preferred) [phone] [email or web address]  [facility], [place] [address] Phone: [phone] Fax: [phone]    Pre-screening note:  Our staff contacted [name] and offered her an "in person", "face-to-face" appointment versus a telephone encounter. She indicated preferring the telephone encounter, at this time.   Reason for Virtual Visit: COVID-19*  Social distancing based on CDC and AMA recommendations.   I contacted Teresa Rhodes on 05/10/2019 via telephone.      I clearly identified myself as Gillis Santa, MD. I verified that I was speaking with the correct person using two identifiers (Name: IVALOU CIRIELLO, and date of birth: 1970/09/05).  Advanced Informed Consent I sought verbal advanced consent from Teresa Rhodes for virtual visit interactions. I informed [name] of possible security and privacy concerns, risks, and limitations associated with providing "not-in-person" medical evaluation and management services. I also informed [name] of the availability of "in-person" appointments. Finally, I informed her that there would be a charge for the virtual visit and that she could be  personally, fully or partially, financially responsible for it. [name] expressed understanding and agreed to proceed.   Historic Elements   Teresa Rhodes is a 48 y.o. year old, female patient evaluated today after her last encounter by our practice on 05/09/2019. [name]  has a past medical history of Complication of anesthesia, Depression, Gallstones, Hypertension, Migraine, and Seizures ([place]) (2010-2013). She also  has a past surgical history that includes Cholecystectomy  (2002); Neck surgery (2005 & 2009); Back surgery; Esophagogastroduodenoscopy (egd) with propofol (N/A, 04/10/2017); and Colonoscopy with propofol (N/A, 04/10/2017). Ms. Mclin has a current medication list which includes the following prescription(s): amphetamine-dextroamphetamine, benazepril-hydrochlorthiazide, diazepam, emgality, omeprazole, and vitamin d (ergocalciferol). She  reports that she quit smoking about 4 years ago. Her smoking use included cigarettes. She has a 1.50 pack-year smoking history. She has never used smokeless tobacco. She reports that she does not drink alcohol or use drugs. [name] is allergic to robaxin [methocarbamol].   HPI  Today, she is being contacted for medication management.   Patient was not aware of her appointment this morning.  She was sleeping when I called her.  She states that tramadol that I prescribed was not effective and she has not been taking it.  She did have a urinary tract infection which she thinks was related to tramadol but I told her unlikely.  At this point, I do not have any additional therapeutic modalities to offer the patient.  She has tried various analgesic agents including gabapentin.  She is tried various muscle relaxers which have worsened her headaches.  We have tried various opioid analgesics as well including hydrocodone, tramadol, buprenorphine none of which have been very effective.  Per the recommendation of her neurologist, it was recommended that she discontinue her opioid analgesics as these could be contributing to her headaches.  We have also tried diagnostic cervical facet medial branch nerve blocks which were not effective and worsened her pain.  Patient does have history of cervical spine surgery which is largely contributing to her headaches and neck pain.  Encouraged the patient to try heat to her neck and be mindful of any positions that trigger or exacerbate her pain.  It  is unfortunate that after so many medication trials and  diagnostic interventions, patient has not achieved significant pain relief.  I encouraged patient to follow back up with her primary care provider.   UDS:  Summary  Date Value Ref Range Status  05/25/2018 FINAL  Final    Comment:    ==================================================================== TOXASSURE SELECT 13 (MW) ==================================================================== Test                             Result       Flag       Units Drug Present   Amphetamine                    >3922                   ng/mg creat    Amphetamine is available as a schedule II prescription drug.   Oxazepam                       32                      ng/mg creat    Oxazepam may be administered as a scheduled prescription    medication; it is also an expected metabolite of other    benzodiazepine drugs, including diazepam, chlordiazepoxide,    prazepam, clorazepate, halazepam, and temazepam.   Hydrocodone                    1809                    ng/mg creat   Hydromorphone                  189                     ng/mg creat   Dihydrocodeine                 280                     ng/mg creat   Norhydrocodone                 563                     ng/mg creat    Sources of hydrocodone include scheduled prescription    medications. Hydromorphone, dihydrocodeine and norhydrocodone are    expected metabolites of hydrocodone. Hydromorphone and    dihydrocodeine are also available as scheduled prescription    medications. ==================================================================== Test                      Result    Flag   Units      Ref Range   Creatinine              255              mg/dL      >=20 ==================================================================== Declared Medications:  Medication list was not provided. ==================================================================== For clinical consultation, please call [phone]. ====================================================================    Laboratory Chemistry Profile (12 mo)  Renal: No results found for requested labs within last 8760 hours.  Lab Results  Component Value Date   GFRAA >60 02/11/2018   GFRNONAA >60 02/11/2018   Hepatic: No results found for  requested labs within last 8760 hours. Lab Results  Component Value Date   AST 27 04/07/2017   ALT 30 04/07/2017   Other: No results found for requested labs within last 8760 hours. Note: Above Lab results reviewed.  Assessment  The primary encounter diagnosis was Cervical spondylosis. Diagnoses of Chronic neck pain, History of fusion of lumbar spine, Right foot drop, Failed back surgical syndrome, Chronic bilateral low back pain with bilateral sciatica, Lumbar radiculopathy, and Chronic pain syndrome were also pertinent to this visit.  Plan of Care  I am having Teresa Rhodes maintain her diazepam, Vitamin D (Ergocalciferol), omeprazole, amphetamine-dextroamphetamine, Emgality, and benazepril-hydrochlorthiazide.  -Discontinue Tramadol. No follow up needed.  Follow-up plan:   Return for No follow up needed.     s/p diagnostic cervical facet medial branch nerve blocks at C4, C5, C6 given cervicogenic headaches: Not effective      Recent Visits Date Type Provider Dept  03/15/19 Office Visit Gillis Santa, MD [facility]  02/28/19 Procedure visit Gillis Santa, MD [facility]  02/13/19 Office Visit Gillis Santa, MD [facility]  Showing recent visits within past 90 days and meeting all other requirements   Today's Visits Date Type Provider Dept  05/10/19 Office Visit Gillis Santa, MD [facility]  Showing today's visits and meeting all other requirements   Future Appointments No visits were found meeting these conditions.  Showing future appointments within next 90 days and meeting all other requirements   I discussed the assessment  and treatment plan with the patient. The patient was provided an opportunity to ask questions and all were answered. The patient agreed with the plan and demonstrated an understanding of the instructions.  Patient advised to call back or seek an in-person evaluation if the symptoms or condition worsens.  Total duration of non-face-to-face encounter: 20 minutes.  Note by: Gillis Santa, MD Date: 05/10/2019; Time: 8:54 AM  Note: This dictation was prepared with Dragon dictation. Any transcriptional errors that may result from this process are unintentional.  Disclaimer:  * Given the special circumstances of the COVID-19 pandemic, the federal government has announced that the Office for Civil Rights (OCR) will exercise its enforcement discretion and will not impose penalties on physicians using telehealth in the event of noncompliance with regulatory requirements under the [place] (HIPAA) in connection with the good faith provision of telehealth during the XX123456 national public health emergency. ([place])

## 2019-06-08 DIAGNOSIS — F411 Generalized anxiety disorder: Secondary | ICD-10-CM | POA: Insufficient documentation

## 2019-07-17 NOTE — Progress Notes (Signed)
PCP:  Idelle Crouch, MD   Chief Complaint  Patient presents with  . Gynecologic Exam    tender breasts     HPI:      Ms. Teresa Rhodes is a 48 y.o. G2P1011 who LMP was No LMP recorded. Patient has had an ablation., presents today for her annual examination.  Her menses are infrequent due to endometrial ablation, lasting 1-4 days, very light spotting.  Dysmenorrhea none. She does not have intermenstrual bleeding.   Sex activity: single partner, contraception - vasectomy. No dyspareunia, does have decreased libido Last Pap: June 21, 2018  Results were: no abnormalities /neg HPV DNA   Last mammogram: February 03, 2015  Results were: normal--routine follow-up in 12 months There is no FH of breast cancer. There is no FH of ovarian cancer. The patient does do self-breast exams. Having breast tenderness recently, not drinking much caffeine. Sometimes has before "menses" but doesn't bleed regularly. No masses.  Tobacco use: The patient denies current or previous tobacco use. Alcohol use: none No drug use.  Exercise: moderately active  Colonoscopy: 2018 with hemorrhoids. FH colon cancer in her brother, genetic testing not indicated.   She does get adequate calcium and Vitamin D in her diet. Labs with PCP.  Has frequent constipation. Tries to do fluids and benefiber when she remembers. Can cause pelvic discomfort.    Patient Active Problem List   Diagnosis Date Noted  . Seizure-like activity ([place]) 03/12/2019  . Tingling 01/15/2019  . Tingling in extremities 11/11/2018  . Climacteric 06/21/2018  . Chronic neck pain 05/25/2018  . Chronic upper extremity pain 05/25/2018  . Chronic bilateral low back pain with bilateral sciatica 05/25/2018  . Chronic pain of both lower extremities 05/25/2018  . Right foot drop 05/25/2018  . Nausea & vomiting 04/07/2017  . Seizure ([place]) 12/01/2016  . Deep vein thrombosis (DVT) of left lower extremity ([place]) 11/23/2016  . Leg swelling  11/23/2016  . Pinched nerve 11/23/2016  . Right leg pain 11/23/2016  . Difficulty sleeping 10/20/2016  . Headache disorder 10/20/2016  . Chronic idiopathic constipation 12/04/2015  . Degeneration of intervertebral disc of lumbosacral region 09/12/2015  . Headache, migraine 09/12/2015  . Current tobacco use 09/12/2015  . Back pain 09/11/2015  . Lumbosacral radiculopathy 07/22/2014  . White matter abnormality on MRI of brain 06/12/2014  . Foot drop 06/12/2014  . Right arm weakness 06/12/2014    Past Surgical History:  Procedure Laterality Date  . BACK SURGERY    . CHOLECYSTECTOMY  2002  . COLONOSCOPY WITH PROPOFOL N/A 04/10/2017   Procedure: COLONOSCOPY WITH PROPOFOL;  Surgeon: Jonathon Bellows, MD;  [facility] END[facility]COPY;  Service: Gastroenterology;  Laterality: N/A;  . ESOPHAGOGASTRODUODENOSCOPY (EGD) WITH PROPOFOL N/A 04/10/2017   Procedure: ESOPHAGOGASTRODUODENOSCOPY (EGD) WITH PROPOFOL;  Surgeon: Jonathon Bellows, MD;  Location: [facility] ENDOSCOPY;  Service: Gastroenterology;  Laterality: N/A;  . NECK SURGERY  2005 & 2009   x2    Family History  Problem Relation Age of Onset  . Ulcerative colitis Mother   . Ovarian cysts Mother   . Liver disease Maternal Grandfather   . Kidney disease Father 40  . Kidney disease Brother 58  . Colon cancer Brother 76  . Colon polyps Neg Hx   . Esophageal cancer Neg Hx   . Kidney cancer Neg Hx   . Bladder Cancer Neg Hx     Social History   Socioeconomic History  . Marital status: Married    Spouse name: Not on  file  . Number of children: 1  . Years of education: Not on file  . Highest education level: Not on file  Occupational History  . Occupation: Civil engineer, contracting  Tobacco Use  . Smoking status: Former Smoker    Packs/day: 0.50    Years: 3.00    Pack years: 1.50    Types: Cigarettes    Quit date: 10/24/2014    Years since quitting: 4.7  . Smokeless tobacco: Never Used  . Tobacco comment: pt given handout  Substance and Sexual Activity   . Alcohol use: No    Alcohol/week: 0.0 standard drinks  . Drug use: No  . Sexual activity: Yes    Birth control/protection: None  Other Topics Concern  . Not on file  Social History Narrative  . Not on file   Social Determinants of Health   Financial Resource Strain:   . Difficulty of Paying Living Expenses: Not on file  Food Insecurity:   . Worried About Charity fundraiser in the Last Year: Not on file  . Ran Out of Food in the Last Year: Not on file  Transportation Needs:   . Lack of Transportation (Medical): Not on file  . Lack of Transportation (Non-Medical): Not on file  Physical Activity:   . Days of Exercise per Week: Not on file  . Minutes of Exercise per Session: Not on file  Stress:   . Feeling of Stress : Not on file  Social Connections:   . Frequency of Communication with Friends and Family: Not on file  . Frequency of Social Gatherings with Friends and Family: Not on file  . Attends Religious Services: Not on file  . Active Member of Clubs or Organizations: Not on file  . Attends Archivist Meetings: Not on file  . Marital Status: Not on file  Intimate Partner Violence:   . Fear of Current or Ex-Partner: Not on file  . Emotionally Abused: Not on file  . Physically Abused: Not on file  . Sexually Abused: Not on file     Current Outpatient Medications:  .  amphetamine-dextroamphetamine (ADDERALL) 20 MG tablet, TAKE 1 TABLET BY MOUTH TWICE DAILY, Disp: , Rfl:  .  benazepril-hydrochlorthiazide (LOTENSIN HCT) 20-12.5 MG tablet, Take 1 tablet by mouth 2 (two) times a day., Disp: , Rfl:  .  clonazePAM (KLONOPIN) 0.5 MG tablet, Take 0.25 mg by mouth 2 (two) times daily., Disp: , Rfl:  .  diazepam (VALIUM) 5 MG tablet, Take 5 mg by mouth every 12 (twelve) hours as needed for anxiety. , Disp: , Rfl:  .  omeprazole (PRILOSEC) 40 MG capsule, Take by mouth daily., Disp: , Rfl: 3 .  propranolol (INDERAL) 20 MG tablet, Take by mouth., Disp: , Rfl:  .  Vitamin  D, Ergocalciferol, (DRISDOL) 1.25 MG (50000 UT) CAPS capsule, TAKE ONE CAPSULE BY MOUTH ONE TIME PER WEEK, Disp: , Rfl: 11 .  Galcanezumab-gnlm (EMGALITY) 120 MG/ML SOAJ, Inject 120 mg into the skin every 30 (thirty) days., Disp: , Rfl:      ROS:  Review of Systems  Constitutional: Negative for fatigue, fever and unexpected weight change.  Respiratory: Negative for cough, shortness of breath and wheezing.   Cardiovascular: Negative for chest pain, palpitations and leg swelling.  Gastrointestinal: Positive for constipation. Negative for blood in stool, diarrhea, nausea and vomiting.  Endocrine: Negative for cold intolerance, heat intolerance and polyuria.  Genitourinary: Negative for dyspareunia, dysuria, flank pain, frequency, genital sores, hematuria, menstrual problem, pelvic  pain, urgency, vaginal bleeding, vaginal discharge and vaginal pain.  Musculoskeletal: Positive for arthralgias and joint swelling. Negative for back pain and myalgias.  Skin: Negative for rash.  Neurological: Positive for dizziness and headaches. Negative for syncope, light-headedness and numbness.  Hematological: Negative for adenopathy.  Psychiatric/Behavioral: Positive for agitation. Negative for confusion, sleep disturbance and suicidal ideas. The patient is not nervous/anxious.   BREAST: tenderness   Objective: BP 138/84 (BP Location: Left Arm, Patient Position: Sitting, Cuff Size: Normal)   Pulse 96   Ht 5\' 9"  (1.753 m)   Wt 207 lb (93.9 kg)   BMI 30.57 kg/m    Physical Exam Constitutional:      Appearance: She is well-developed.  Genitourinary:     Vulva, vagina, cervix, uterus, right adnexa and left adnexa normal.     No vulval lesion or tenderness noted.     No vaginal discharge, erythema or tenderness.     No cervical polyp.     Uterus is not enlarged or tender.     No right or left adnexal mass present.     Right adnexa not tender.     Left adnexa not tender.  Neck:     Thyroid: No  thyromegaly.  Cardiovascular:     Rate and Rhythm: Normal rate and regular rhythm.     Heart sounds: Normal heart sounds. No murmur.  Pulmonary:     Effort: Pulmonary effort is normal.     Breath sounds: Normal breath sounds.  Chest:     Breasts:        Right: Tenderness present. No mass, nipple discharge or skin change.        Left: Tenderness present. No mass, nipple discharge or skin change.  Abdominal:     Palpations: Abdomen is soft.     Tenderness: There is abdominal tenderness in the right lower quadrant, suprapubic area and left lower quadrant. There is no guarding or rebound.  Musculoskeletal:        General: Normal range of motion.     Cervical back: Normal range of motion.  Neurological:     General: No focal deficit present.     Mental Status: She is alert and oriented to person, place, and time.     Cranial Nerves: No cranial nerve deficit.  Skin:    General: Skin is warm and dry.  Psychiatric:        Mood and Affect: Mood normal.        Behavior: Behavior normal.        Thought Content: Thought content normal.        Judgment: Judgment normal.  Vitals reviewed.     Assessment/Plan: Encounter for annual routine gynecological examination  Encounter for screening mammogram for malignant neoplasm of breast - Plan: 3D MAMMOGRAM SCREENING BILATERAL; pt to sched mammo  Breast tenderness--Tender on exam, no masses. Check mammo. D/C caffeine.   Chronic idiopathic constipation--increase fiber/lots of water. Followed by PCP            GYN counsel breast self exam, mammography screening, adequate intake of calcium and vitamin D, diet and exercise     F/U  Return in about 1 year (around 07/17/2020).  Cerena Baine B. Layza Summa, PA-C 07/18/2019 9:32 AM

## 2019-07-18 ENCOUNTER — Ambulatory Visit (INDEPENDENT_AMBULATORY_CARE_PROVIDER_SITE_OTHER): Payer: 59 | Admitting: Obstetrics and Gynecology

## 2019-07-18 ENCOUNTER — Other Ambulatory Visit: Payer: Self-pay | Admitting: Obstetrics and Gynecology

## 2019-07-18 ENCOUNTER — Other Ambulatory Visit: Payer: Self-pay

## 2019-07-18 ENCOUNTER — Encounter: Payer: Self-pay | Admitting: Obstetrics and Gynecology

## 2019-07-18 VITALS — BP 138/84 | HR 96 | Ht 69.0 in | Wt 207.0 lb

## 2019-07-18 DIAGNOSIS — Z01419 Encounter for gynecological examination (general) (routine) without abnormal findings: Secondary | ICD-10-CM | POA: Diagnosis not present

## 2019-07-18 DIAGNOSIS — Z1231 Encounter for screening mammogram for malignant neoplasm of breast: Secondary | ICD-10-CM

## 2019-07-18 DIAGNOSIS — K5904 Chronic idiopathic constipation: Secondary | ICD-10-CM

## 2019-07-18 DIAGNOSIS — N644 Mastodynia: Secondary | ICD-10-CM

## 2019-07-18 DIAGNOSIS — N6489 Other specified disorders of breast: Secondary | ICD-10-CM

## 2019-07-18 NOTE — Patient Instructions (Signed)
I value your feedback and entrusting us with your care. If you get a [facility] patient survey, I would appreciate you taking the time to let us know about your experience today. Thank you!  As of June 28, 2019, your lab results will be released to your MyChart immediately, before I even have a chance to see them. Please give me time to review them and contact you if there are any abnormalities. Thank you for your patience.  

## 2019-07-24 ENCOUNTER — Encounter: Payer: Self-pay | Admitting: Obstetrics and Gynecology

## 2019-07-26 ENCOUNTER — Ambulatory Visit
Admission: RE | Admit: 2019-07-26 | Discharge: 2019-07-26 | Disposition: A | Discharge status: Home / Self Care | Payer: 59 | Source: Ambulatory Visit | Attending: Obstetrics and Gynecology | Admitting: Obstetrics and Gynecology

## 2019-07-26 ENCOUNTER — Encounter: Payer: Self-pay | Admitting: Obstetrics and Gynecology

## 2019-07-26 DIAGNOSIS — N644 Mastodynia: Secondary | ICD-10-CM

## 2019-07-26 DIAGNOSIS — N6489 Other specified disorders of breast: Secondary | ICD-10-CM | POA: Diagnosis present

## 2019-09-26 ENCOUNTER — Encounter: Payer: Self-pay | Admitting: Licensed Clinical Social Worker

## 2019-09-26 ENCOUNTER — Other Ambulatory Visit: Payer: Self-pay

## 2019-09-26 ENCOUNTER — Ambulatory Visit (INDEPENDENT_AMBULATORY_CARE_PROVIDER_SITE_OTHER): Payer: 59 | Admitting: Licensed Clinical Social Worker

## 2019-09-26 DIAGNOSIS — F329 Major depressive disorder, single episode, unspecified: Secondary | ICD-10-CM

## 2019-09-26 DIAGNOSIS — F32A Depression, unspecified: Secondary | ICD-10-CM
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
## 2019-10-11 ENCOUNTER — Ambulatory Visit (INDEPENDENT_AMBULATORY_CARE_PROVIDER_SITE_OTHER): Payer: 59 | Admitting: Licensed Clinical Social Worker

## 2019-10-11 ENCOUNTER — Other Ambulatory Visit: Payer: Self-pay

## 2019-10-11 ENCOUNTER — Encounter: Payer: Self-pay | Admitting: Licensed Clinical Social Worker

## 2019-10-11 DIAGNOSIS — F329 Major depressive disorder, single episode, unspecified: Secondary | ICD-10-CM | POA: Diagnosis not present

## 2019-10-11 DIAGNOSIS — F32A Depression, unspecified: Secondary | ICD-10-CM

## 2019-10-11 NOTE — Progress Notes (Signed)
Virtual Visit via Video Note  I connected with Teresa Rhodes on 10/11/19 at  9:00 AM EDT by a video enabled telemedicine application and verified that I am speaking with the correct person using two identifiers.   I discussed the limitations of evaluation and management by telemedicine and the availability of in person appointments. The patient expressed understanding and agreed to proceed.  THERAPY PROGRESS NOTE  Session Time: 38 Minutes  Participation Level: Active  Behavioral Response: CasualAlertDepressed  Type of Therapy: Individual Therapy  Treatment Goals addressed: Coping  Interventions: CBT  Summary: Teresa Rhodes is a 49 y.o. female who presents with depression sxs. Pt was tearful throughout session, oriented x5 and did not endorse SI. Pt reported that she has been experiencing work related stress which leads her feeling drained both physically and emotionally at the end of the day. Pt reported she often copes by spending some downtime by herself or will talk to her supports on the phone. Pt identified hobbies and interests that she used to engage in to decompress, however reported "I am really hard on myself and don't feel good enough". Pt reported feeling guilty at prospect of self-care and made the connection between having low self-esteem and events from her past.   Suicidal/Homicidal: No  Therapist Response: Therapist met with patient for first session since CCA. Therapist and patient reviewed treatment plan and goals. Pt in agreement. Therapist and patient discussed current stressors and coping skills she has used to manage her depression sxs. Therapist encouraged patient to identify self-care techniques and leisure activities she can engage in outside of work. Therapist asked open-ended questions to obtain better understanding of patient self-doubt and where it began. Pt informed therapist "I can't talk about that". Therapist validated patient concerns and feelings  shifting focus back to present day stressors. Therapist introduced concept of using her love of music to express her feelings in a constructive and non-threatening way. Therapist assigned patient homework to complete a Soundtrack of My Life exercise. Pt was receptive.  Plan: Return again in 2 weeks.  Diagnosis: Axis I: Depressive Disorder NOS    Axis II: N/A    Josephine Igo, LCSW, LCAS 10/11/2019

## 2019-10-25 ENCOUNTER — Ambulatory Visit: Payer: 59 | Admitting: Licensed Clinical Social Worker

## 2019-12-14 ENCOUNTER — Other Ambulatory Visit: Payer: Self-pay

## 2019-12-14 ENCOUNTER — Other Ambulatory Visit: Payer: Self-pay | Admitting: Internal Medicine

## 2019-12-14 ENCOUNTER — Ambulatory Visit
Admission: RE | Admit: 2019-12-14 | Discharge: 2019-12-14 | Disposition: A | Discharge status: Home / Self Care | Payer: 59 | Source: Ambulatory Visit | Attending: Internal Medicine | Admitting: Internal Medicine

## 2019-12-14 DIAGNOSIS — R1032 Left lower quadrant pain: Secondary | ICD-10-CM

## 2019-12-14 MED ORDER — IOHEXOL 300 MG/ML  SOLN
100.0000 mL | Freq: Once | INTRAMUSCULAR | Status: AC | PRN
  Administered 2019-12-14: 100 mL via INTRAVENOUS

## 2020-01-04 ENCOUNTER — Other Ambulatory Visit: Payer: Self-pay | Admitting: Gastroenterology

## 2020-01-04 DIAGNOSIS — R6881 Early satiety: Secondary | ICD-10-CM

## 2020-01-04 DIAGNOSIS — R112 Nausea with vomiting, unspecified: Secondary | ICD-10-CM

## 2020-01-04 DIAGNOSIS — R140 Abdominal distension (gaseous): Secondary | ICD-10-CM

## 2020-01-04 DIAGNOSIS — R1013 Epigastric pain: Secondary | ICD-10-CM

## 2020-01-08 ENCOUNTER — Other Ambulatory Visit: Payer: Self-pay | Admitting: Gastroenterology

## 2020-01-08 DIAGNOSIS — R6881 Early satiety: Secondary | ICD-10-CM

## 2020-01-08 DIAGNOSIS — R112 Nausea with vomiting, unspecified: Secondary | ICD-10-CM

## 2020-01-08 DIAGNOSIS — R140 Abdominal distension (gaseous): Secondary | ICD-10-CM

## 2020-01-08 DIAGNOSIS — R1013 Epigastric pain: Secondary | ICD-10-CM

## 2020-01-22 ENCOUNTER — Ambulatory Visit
Admission: RE | Admit: 2020-01-22 | Discharge: 2020-01-22 | Disposition: A | Discharge status: Home / Self Care | Payer: 59 | Source: Ambulatory Visit | Attending: Gastroenterology | Admitting: Gastroenterology

## 2020-01-22 ENCOUNTER — Other Ambulatory Visit: Payer: Self-pay

## 2020-01-22 DIAGNOSIS — R112 Nausea with vomiting, unspecified: Secondary | ICD-10-CM | POA: Diagnosis present

## 2020-01-22 DIAGNOSIS — R140 Abdominal distension (gaseous): Secondary | ICD-10-CM | POA: Diagnosis present

## 2020-01-22 DIAGNOSIS — R6881 Early satiety: Secondary | ICD-10-CM

## 2020-01-22 DIAGNOSIS — R1013 Epigastric pain: Secondary | ICD-10-CM | POA: Diagnosis not present

## 2020-01-22 MED ORDER — TECHNETIUM TC 99M SULFUR COLLOID
2.0000 | Freq: Once | INTRAVENOUS | Status: AC | PRN
  Administered 2020-01-22: 2.617 via ORAL

## 2020-07-08 ENCOUNTER — Other Ambulatory Visit: Payer: Self-pay | Admitting: Internal Medicine

## 2020-07-08 DIAGNOSIS — Z1231 Encounter for screening mammogram for malignant neoplasm of breast: Secondary | ICD-10-CM

## 2020-11-19 DIAGNOSIS — E559 Vitamin D deficiency, unspecified: Secondary | ICD-10-CM | POA: Insufficient documentation

## 2021-03-20 ENCOUNTER — Encounter: Payer: Self-pay | Admitting: Emergency Medicine

## 2021-03-20 ENCOUNTER — Other Ambulatory Visit: Payer: Self-pay

## 2021-03-20 ENCOUNTER — Emergency Department: Payer: BC Managed Care – PPO

## 2021-03-20 ENCOUNTER — Emergency Department
Admission: EM | Admit: 2021-03-20 | Discharge: 2021-03-20 | Disposition: A | Discharge status: Home / Self Care | Payer: BC Managed Care – PPO | Attending: Emergency Medicine | Admitting: Emergency Medicine

## 2021-03-20 DIAGNOSIS — Z87891 Personal history of nicotine dependence: Secondary | ICD-10-CM | POA: Diagnosis not present

## 2021-03-20 DIAGNOSIS — I10 Essential (primary) hypertension: Secondary | ICD-10-CM | POA: Insufficient documentation

## 2021-03-20 DIAGNOSIS — Z79899 Other long term (current) drug therapy: Secondary | ICD-10-CM | POA: Insufficient documentation

## 2021-03-20 DIAGNOSIS — M5431 Sciatica, right side: Secondary | ICD-10-CM | POA: Diagnosis not present

## 2021-03-20 DIAGNOSIS — M79604 Pain in right leg: Secondary | ICD-10-CM | POA: Diagnosis not present

## 2021-03-20 LAB — BASIC METABOLIC PANEL
Anion gap: 10 (ref 5–15)
BUN: 9 mg/dL (ref 6–20)
CO2: 27 mmol/L (ref 22–32)
Calcium: 9.6 mg/dL (ref 8.9–10.3)
Chloride: 100 mmol/L (ref 98–111)
Creatinine, Ser: 0.67 mg/dL (ref 0.44–1.00)
GFR, Estimated: >60 mL/min (ref >60)
Glucose, Bld: 100 mg/dL — ABNORMAL HIGH (ref 70–99)
Potassium: 3.6 mmol/L (ref 3.5–5.1)
Sodium: 137 mmol/L (ref 135–145)

## 2021-03-20 LAB — CBC WITH DIFFERENTIAL/PLATELET
Abs Immature Granulocytes: 0.03 10*3/uL (ref 0.00–0.07)
Basophils Absolute: 0.1 10*3/uL (ref 0.0–0.1)
Basophils Relative: 1 %
Eosinophils Absolute: 0.2 10*3/uL (ref 0.0–0.5)
Eosinophils Relative: 1 %
HCT: 46.1 % — ABNORMAL HIGH (ref 36.0–46.0)
Hemoglobin: 16.2 g/dL — ABNORMAL HIGH (ref 12.0–15.0)
Immature Granulocytes: 0 %
Lymphocytes Relative: 31 %
Lymphs Abs: 4.2 10*3/uL — ABNORMAL HIGH (ref 0.7–4.0)
MCH: 30.2 pg (ref 26.0–34.0)
MCHC: 35.1 g/dL (ref 30.0–36.0)
MCV: 86 fL (ref 80.0–100.0)
Monocytes Absolute: 0.5 10*3/uL (ref 0.1–1.0)
Monocytes Relative: 4 %
Neutro Abs: 8.8 10*3/uL — ABNORMAL HIGH (ref 1.7–7.7)
Neutrophils Relative %: 63 %
Platelets: 287 10*3/uL (ref 150–400)
RBC: 5.36 MIL/uL — ABNORMAL HIGH (ref 3.87–5.11)
RDW: 13.2 % (ref 11.5–15.5)
WBC: 13.7 10*3/uL — ABNORMAL HIGH (ref 4.0–10.5)
nRBC: 0 % (ref 0.0–0.2)

## 2021-03-20 LAB — TROPONIN I (HIGH SENSITIVITY): Troponin I (High Sensitivity): 3 ng/L (ref <18)

## 2021-03-20 LAB — HCG, QUANTITATIVE, PREGNANCY: hCG, Beta Chain, Quant, S: 1 m[IU]/mL (ref <5)

## 2021-03-20 LAB — D-DIMER, QUANTITATIVE: D-Dimer, Quant: 0.34 ug/mL-FEU (ref 0.00–0.50)

## 2021-03-20 MED ORDER — DEXAMETHASONE SODIUM PHOSPHATE 10 MG/ML IJ SOLN
10.0000 mg | Freq: Once | INTRAMUSCULAR | Status: AC
  Administered 2021-03-20: 10 mg via INTRAMUSCULAR
  Filled 2021-03-20: qty 1

## 2021-03-20 MED ORDER — ORPHENADRINE CITRATE 30 MG/ML IJ SOLN
60.0000 mg | Freq: Once | INTRAMUSCULAR | Status: AC
  Administered 2021-03-20: 60 mg via INTRAMUSCULAR
  Filled 2021-03-20: qty 2

## 2021-03-20 MED ORDER — MELOXICAM 15 MG PO TABS: 15.0000 mg | ORAL_TABLET | Freq: Every day | ORAL | 0 refills | Status: DC

## 2021-03-20 MED ORDER — CYCLOBENZAPRINE HCL 10 MG PO TABS: 10.0000 mg | ORAL_TABLET | Freq: Three times a day (TID) | ORAL | 1 refills | Status: DC | PRN

## 2021-03-20 MED ORDER — KETOROLAC TROMETHAMINE 30 MG/ML IJ SOLN
30.0000 mg | Freq: Once | INTRAMUSCULAR | Status: AC
  Administered 2021-03-20: 30 mg via INTRAMUSCULAR
  Filled 2021-03-20: qty 1

## 2021-03-20 MED ORDER — PREDNISONE 50 MG PO TABS: 50.0000 mg | ORAL_TABLET | Freq: Every day | ORAL | 0 refills | Status: DC

## 2021-03-20 NOTE — ED Notes (Signed)
Called no answer, bathroom and outside checked.

## 2021-03-20 NOTE — ED Triage Notes (Signed)
Patient arrives ambulatory c/o right leg pain x 5 days. Denies any injury. No relief with muscle relaxer last night. Reports feeling winded and nauseated today.

## 2021-03-20 NOTE — ED Notes (Signed)
Called no answer. Unable to find pt.  Attempted to call cell phone.

## 2021-03-20 NOTE — ED Provider Notes (Signed)
[facility] Emergency Department Provider Note  ____________________________________________  Time seen: Approximately 8:04 PM  I have reviewed the triage vital signs and the nursing notes.   HISTORY  Chief Complaint Leg Pain    HPI Teresa Rhodes is a 50 y.o. female who presents the emergency department complaining of right leg pain.  Patient states that she has a history of sciatica initially symptoms started to feel the same.  However she has had different sensation specifically about her calf and became concerned that she may have a DVT.  She had some nausea and chest pain earlier but this resolved.  She has no shortness of breath or descriptions of pleuritic chest pain at this time.  Pain is still present, it is described as a sharp and burning sensation.  There was no reported skin changes.  Medical history as described below.  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       Past Medical History:  Diagnosis Date   Complication of anesthesia    states she woke up during  neck surgery   Depression    Gallstones    Hypertension    Migraine    Seizures ([place]) 2010-2013   Brain seizures    Patient Active Problem List   Diagnosis Date Noted   Seizure-like activity ([place]) 03/12/2019   Tingling 01/15/2019   Tingling in extremities 11/11/2018   Climacteric 06/21/2018   Chronic neck pain 05/25/2018   Chronic upper extremity pain 05/25/2018   Chronic bilateral low back pain with bilateral sciatica 05/25/2018   Chronic pain of both lower extremities 05/25/2018   Right foot drop 05/25/2018   Nausea & vomiting 04/07/2017   Seizure ([place]) 12/01/2016   Deep vein thrombosis (DVT) of left lower extremity ([place]) 11/23/2016   Leg swelling 11/23/2016   Pinched nerve 11/23/2016   Right leg pain 11/23/2016   Difficulty sleeping 10/20/2016   Headache disorder 10/20/2016   Chronic idiopathic constipation 12/04/2015   Degeneration of  intervertebral disc of lumbosacral region 09/12/2015   Headache, migraine 09/12/2015   Current tobacco use 09/12/2015   Back pain 09/11/2015   Lumbosacral radiculopathy 07/22/2014   White matter abnormality on MRI of brain 06/12/2014   Foot drop 06/12/2014   Right arm weakness 06/12/2014    Past Surgical History:  Procedure Laterality Date   BACK SURGERY     CHOLECYSTECTOMY  2002   COLONOSCOPY WITH PROPOFOL N/A 04/10/2017   Procedure: COLONOSCOPY WITH PROPOFOL;  Surgeon: Jonathon Bellows, MD;  Location[facility] ENDOSCOPY;  Service: Gastroenterology;  Laterality: N/A;   ESOPHAGOGASTRODUODENOSCOPY (EGD) WITH PROPOFOL N/A 04/10/2017   Procedure: ESOPHAGOGASTRODUODENOSCOPY (EGD) WITH PROPOFOL;  Surgeon: Jonathon Bellows, MD;  Location[facility] ENDOSCOPY;  Service: Gastroenterology;  Laterality: N/A;   NECK SURGERY  2005 & 2009   x2    Prior to Admission medications   Medication Sig Start Date End Date Taking? Authorizing Provider  cyclobenzaprine (FLEXERIL) 10 MG tablet Take 1 tablet (10 mg total) by mouth 3 (three) times daily as needed for muscle spasms. 03/20/21  Yes Zlaty Alexa, Charline Bills, PA-C  meloxicam (MOBIC) 15 MG tablet Take 1 tablet (15 mg total) by mouth daily. 03/20/21  Yes Alima Naser, Charline Bills, PA-C  predniSONE (DELTASONE) 50 MG tablet Take 1 tablet (50 mg total) by mouth daily with breakfast. 03/20/21  Yes Oneal Schoenberger, Charline Bills, PA-C  amphetamine-dextroamphetamine (ADDERALL) 20 MG tablet TAKE 1 TABLET BY MOUTH TWICE DAILY 11/01/18   [provider]  benazepril-hydrochlorthiazide (LOTENSIN  HCT) 20-12.5 MG tablet Take 1 tablet by mouth 2 (two) times a day.    [provider]  clonazePAM (KLONOPIN) 0.5 MG tablet Take 0.25 mg by mouth 2 (two) times daily. 07/04/19   [provider]  diazepam (VALIUM) 5 MG tablet Take 5 mg by mouth every 12 (twelve) hours as needed for anxiety.     [provider]  Galcanezumab-gnlm (EMGALITY) 120 MG/ML SOAJ Inject 120 mg into the skin  every 30 (thirty) days.    [provider]  omeprazole (PRILOSEC) 40 MG capsule Take by mouth daily. 05/03/18   [provider]  propranolol (INDERAL) 20 MG tablet Take by mouth. 06/08/19 06/07/20  [provider]  Vitamin D, Ergocalciferol, (DRISDOL) 1.25 MG (50000 UT) CAPS capsule TAKE ONE CAPSULE BY MOUTH ONE TIME PER WEEK 04/26/18   [provider]    Allergies Robaxin [methocarbamol]  Family History  Problem Relation Age of Onset   Ulcerative colitis Mother    Ovarian cysts Mother    Liver disease Maternal Grandfather    Kidney disease Father 60   Kidney disease Brother 41   Colon cancer Brother 40   Colon polyps Neg Hx    Esophageal cancer Neg Hx    Kidney cancer Neg Hx    Bladder Cancer Neg Hx    Breast cancer Neg Hx     Social History Social History   Tobacco Use   Smoking status: Former    Packs/day: 0.50    Years: 3.00    Pack years: 1.50    Types: Cigarettes    Quit date: 10/24/2014    Years since quitting: 6.4   Smokeless tobacco: Never   Tobacco comments:    pt given handout  Vaping Use   Vaping Use: Never used  Substance Use Topics   Alcohol use: No    Alcohol/week: 0.0 standard drinks   Drug use: No     Review of Systems  Constitutional: No fever/chills Eyes: No visual changes. No discharge ENT: No upper respiratory complaints. Cardiovascular: no chest pain. Respiratory: no cough. No SOB. Gastrointestinal: No abdominal pain.  No nausea, no vomiting.  No diarrhea.  No constipation. Genitourinary: Negative for dysuria. No hematuria Musculoskeletal: Positive for left leg/left calf pain Skin: Negative for rash, abrasions, lacerations, ecchymosis. Neurological: Negative for headaches, focal weakness or numbness.  10 System ROS otherwise negative.  ____________________________________________   PHYSICAL EXAM:  VITAL SIGNS: ED Triage Vitals  Enc Vitals Group     BP 03/20/21 1835 125/88     Pulse Rate  03/20/21 1835 (!) 118     Resp 03/20/21 1835 16     Temp 03/20/21 1835 98.9 F (37.2 C)     Temp Source 03/20/21 1835 Oral     SpO2 03/20/21 1835 97 %     Weight 03/20/21 1843 186 lb (84.4 kg)     Height 03/20/21 1843 '5\' 8"'$  (1.727 m)     Head Circumference --      Peak Flow --      Pain Score 03/20/21 1836 6     Pain Loc --      Pain Edu? --      Excl. in [place]? --      Constitutional: Alert and oriented. Well appearing and in no acute distress. Eyes: Conjunctivae are normal. PERRL. EOMI. Head: Atraumatic. ENT:      Ears:       Nose: No congestion/rhinnorhea.      Mouth/Throat: Mucous membranes  are moist.  Neck: No stridor.    Cardiovascular: Normal rate, regular rhythm. Normal S1 and S2.  Good peripheral circulation. Respiratory: Normal respiratory effort without tachypnea or retractions. Lungs CTAB. Good air entry to the bases with no decreased or absent breath sounds. Gastrointestinal: Bowel sounds 4 quadrants. Soft and nontender to palpation. No guarding or rigidity. No palpable masses. No distention. No CVA tenderness. Musculoskeletal: Full range of motion to all extremities. No gross deformities appreciated.  Visualization of the lumbar spine reveals no visible trauma.  No abnormality.  Patient is tender starting in the lumbar spine especially in the right paraspinal muscle group extending into the SI joint, sciatic notch and down the right leg.  Patient is diffusely tender to palpation along the right lower extremity most tenderness being along the lateral calf and into the calf itself.  There is no erythema, edema or warmth to palpation.  Good range of motion is preserved to the hip, knee and ankle joint.  Sensation is decreased in the right lower extremity but patient states that this is baseline for her.  She denied any significant changes on light sensation.  Dorsalis pedis pulse intact.  Capillary refill less than 2 seconds all digits. Neurologic:  Normal speech and language.  No gross focal neurologic deficits are appreciated.  Skin:  Skin is warm, dry and intact. No rash noted. Psychiatric: Mood and affect are normal. Speech and behavior are normal. Patient exhibits appropriate insight and judgement.   ____________________________________________   LABS (all labs ordered are listed, but only abnormal results are displayed)  Labs Reviewed  CBC WITH DIFFERENTIAL/PLATELET - Abnormal; Notable for the following components:      Result Value   WBC 13.7 (*)    RBC 5.36 (*)    Hemoglobin 16.2 (*)    HCT 46.1 (*)    Neutro Abs 8.8 (*)    Lymphs Abs 4.2 (*)    All other components within normal limits  BASIC METABOLIC PANEL - Abnormal; Notable for the following components:   Glucose, Bld 100 (*)    All other components within normal limits  D-DIMER, QUANTITATIVE  HCG, QUANTITATIVE, PREGNANCY  TROPONIN I (HIGH SENSITIVITY)  TROPONIN I (HIGH SENSITIVITY)   ____________________________________________  EKG   ____________________________________________  RADIOLOGY I personally viewed and evaluated these images as part of my medical decision making, as well as reviewing the written report by the radiologist.  ED Provider Interpretation: No acute cardiopulmonary abnormality on chest x-ray, no evidence of DVT on ultrasound  DG Chest 2 View  Result Date: 03/20/2021 CLINICAL DATA:  Shortness of breath EXAM: CHEST - 2 VIEW COMPARISON:  02/11/2018 FINDINGS: Lungs are clear.  No pleural effusion or pneumothorax. The heart is normal in size. Cholecystectomy clips. Cervical spine fixation hardware, incompletely visualized. IMPRESSION: No evidence of acute cardiopulmonary disease. Electronically Signed   By: Julian Hy M.D.   On: 03/20/2021 19:35   US Venous Img Lower Unilateral Right  Result Date: 03/20/2021 CLINICAL DATA:  Right calf pain. EXAM: RIGHT LOWER EXTREMITY VENOUS DOPPLER ULTRASOUND TECHNIQUE: Gray-scale sonography with compression, as well as color  and duplex ultrasound, were performed to evaluate the deep venous system(s) from the level of the common femoral vein through the popliteal and proximal calf veins. COMPARISON:  Lower extremity ultrasound 11/26/2016 FINDINGS: VENOUS Normal compressibility of the common femoral, superficial femoral, and popliteal veins, as well as the visualized calf veins. Visualized portions of profunda femoral vein and great saphenous vein unremarkable. No filling defects to  suggest DVT on grayscale or color Doppler imaging. Doppler waveforms show normal direction of venous flow, normal respiratory plasticity and response to augmentation. Limited views of the contralateral common femoral vein are unremarkable. OTHER None. Limitations: none IMPRESSION: No evidence of right lower extremity DVT. Electronically Signed   By: Keith Rake M.D.   On: 03/20/2021 20:42    ____________________________________________    PROCEDURES  Procedure(s) performed:    Procedures    Medications  dexamethasone (DECADRON) injection 10 mg (10 mg Intramuscular Given 03/20/21 2207)  orphenadrine (NORFLEX) injection 60 mg (60 mg Intramuscular Given 03/20/21 2207)  ketorolac (TORADOL) 30 MG/ML injection 30 mg (30 mg Intramuscular Given 03/20/21 2207)     ____________________________________________   INITIAL IMPRESSION / ASSESSMENT AND PLAN / ED COURSE  Pertinent labs & imaging results that were available during my care of the patient were reviewed by me and considered in my medical decision making (see chart for details).  Review of the [place] CSRS was performed in accordance of the [place] prior to dispensing any controlled drugs.           Patient's diagnosis is consistent with sciatica.  Patient presented to the emergency department with atypical right leg pain.  She has a long history of sciatica problems and has had multiple surgeries in her cervical and lumbar spine.  She states that she has nerve damage to it appears to be  the L5 nerve distribution and has ongoing chronic sensation loss to her right leg as well as ongoing chronic radicular pain.  Patient had atypical pain in both location, nature and severity.  No acute injuries.  Given the fact that she has had a DVT in the past with the symptoms she is concerned that she may have a DVT.  Patient states that she had had some chest pain and shortness of breath earlier today that had resolved spontaneously.  No return of same.  No pleuritic pain.  Patient had reassuring labs, imaging at this time.  No evidence of DVT.  Given the nature of her symptoms I feel that pain changes and increase are attributed to her sciatic nerve.  She has had no return of any chest pain.  Chest x-ray and EKG are reassuring.  Chest pain was nonpleuritic in nature.  There is no shortness of breath.  No hypoxia or tachycardia.  Follow-up primary care as needed.  Have also referred the patient to neurosurgery for further evaluation of her ongoing sciatica issues.  Patient is given ED precautions to return to the ED for any worsening or new symptoms.     ____________________________________________  FINAL CLINICAL IMPRESSION(S) / ED DIAGNOSES  Final diagnoses:  Sciatica of right side      NEW MEDICATIONS STARTED DURING THIS VISIT:  ED Discharge Orders          Ordered    predniSONE (DELTASONE) 50 MG tablet  Daily with breakfast        03/20/21 2221    meloxicam (MOBIC) 15 MG tablet  Daily        03/20/21 2221    cyclobenzaprine (FLEXERIL) 10 MG tablet  3 times daily PRN        03/20/21 2221                This chart was dictated using voice recognition software/Dragon. Despite best efforts to proofread, errors can occur which can change the meaning. Any change was purely unintentional.    Darletta Moll, PA-C 03/20/21 2234  Harvest Dark, MD 03/20/21 2239

## 2021-03-20 NOTE — ED Notes (Signed)
Called again with no answer called cell phone X 3

## 2021-04-21 DIAGNOSIS — S93401A Sprain of unspecified ligament of right ankle, initial encounter: Secondary | ICD-10-CM | POA: Insufficient documentation

## 2021-05-01 ENCOUNTER — Other Ambulatory Visit: Payer: Self-pay | Admitting: Internal Medicine

## 2021-05-01 DIAGNOSIS — Z1231 Encounter for screening mammogram for malignant neoplasm of breast: Secondary | ICD-10-CM

## 2021-05-20 ENCOUNTER — Other Ambulatory Visit: Payer: Self-pay

## 2021-05-20 ENCOUNTER — Ambulatory Visit
Admission: RE | Admit: 2021-05-20 | Discharge: 2021-05-20 | Disposition: A | Discharge status: Home / Self Care | Payer: BC Managed Care – PPO | Source: Ambulatory Visit | Attending: Internal Medicine | Admitting: Internal Medicine

## 2021-05-20 DIAGNOSIS — Z1231 Encounter for screening mammogram for malignant neoplasm of breast: Secondary | ICD-10-CM | POA: Diagnosis not present

## 2021-05-27 ENCOUNTER — Other Ambulatory Visit: Payer: Self-pay | Admitting: Internal Medicine

## 2021-05-27 DIAGNOSIS — N6320 Unspecified lump in the left breast, unspecified quadrant: Secondary | ICD-10-CM

## 2021-05-27 DIAGNOSIS — R928 Other abnormal and inconclusive findings on diagnostic imaging of breast: Secondary | ICD-10-CM

## 2021-06-04 ENCOUNTER — Ambulatory Visit
Admission: RE | Admit: 2021-06-04 | Discharge: 2021-06-04 | Disposition: A | Discharge status: Home / Self Care | Payer: BC Managed Care – PPO | Source: Ambulatory Visit | Attending: Internal Medicine | Admitting: Internal Medicine

## 2021-06-04 ENCOUNTER — Other Ambulatory Visit: Payer: Self-pay

## 2021-06-04 DIAGNOSIS — R928 Other abnormal and inconclusive findings on diagnostic imaging of breast: Secondary | ICD-10-CM | POA: Insufficient documentation

## 2021-06-04 DIAGNOSIS — N6320 Unspecified lump in the left breast, unspecified quadrant: Secondary | ICD-10-CM | POA: Diagnosis present

## 2021-06-08 ENCOUNTER — Other Ambulatory Visit: Payer: Self-pay | Admitting: Internal Medicine

## 2021-06-08 DIAGNOSIS — N6320 Unspecified lump in the left breast, unspecified quadrant: Secondary | ICD-10-CM

## 2021-06-08 DIAGNOSIS — R928 Other abnormal and inconclusive findings on diagnostic imaging of breast: Secondary | ICD-10-CM

## 2021-06-16 ENCOUNTER — Ambulatory Visit
Admission: RE | Admit: 2021-06-16 | Discharge: 2021-06-16 | Disposition: A | Discharge status: Home / Self Care | Payer: BC Managed Care – PPO | Source: Ambulatory Visit | Attending: Internal Medicine | Admitting: Internal Medicine

## 2021-06-16 ENCOUNTER — Other Ambulatory Visit: Payer: Self-pay

## 2021-06-16 DIAGNOSIS — N6320 Unspecified lump in the left breast, unspecified quadrant: Secondary | ICD-10-CM

## 2021-06-16 DIAGNOSIS — R928 Other abnormal and inconclusive findings on diagnostic imaging of breast: Secondary | ICD-10-CM

## 2021-06-16 HISTORY — PX: BREAST BIOPSY: SHX20

## 2021-06-17 LAB — SURGICAL PATHOLOGY

## 2021-06-24 ENCOUNTER — Ambulatory Visit: Payer: Self-pay | Admitting: Surgery

## 2021-06-24 ENCOUNTER — Other Ambulatory Visit: Payer: Self-pay | Admitting: Surgery

## 2021-06-24 DIAGNOSIS — D242 Benign neoplasm of left breast: Secondary | ICD-10-CM

## 2021-06-24 NOTE — H&P (View-Only) (Signed)
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 female who was referred by Teresa Crouch, MD for evaluation of above. Change was noted on last screening mammogram. Patient does not routinely do self breast exams. Age of menarche was 58. Patient denies hormonal therapy. Patient is G2P1. Patient denies nipple discharge. Patient denies previous breast biopsy. Patient denies a personal history of breast cancer.   Past Medical History:  has a past medical history of Abnormal cytology (01/2013), Anxiety (2001), Chronic idiopathic constipation, Depression (1995), Disc disease, degenerative, lumbar or lumbosacral, Foot drop, right foot, HTN, goal below 140/90, Migraine, Seizures (CMS-HCC), and Tobacco abuse.  Past Surgical History:  has a past surgical history that includes back surgery; neck surgery; and Cholecystectomy.  Family History: family history includes Alcohol abuse in her maternal grandfather and mother; Alzheimer's disease in her maternal grandmother; Anxiety in her mother and sister; Asthma in her sister; Colon cancer in her brother; Coronary Artery Disease (Blocked arteries around heart) in her maternal uncle, maternal uncle, and mother; Depression in her mother and sister; Diabetes type II in her brother, brother, maternal aunt, and maternal grandmother; Glaucoma in her mother; Heart disease in her brother and mother; High blood pressure (Hypertension) in her brother, brother, brother, father, mother, and son; Hyperlipidemia (Elevated cholesterol) in her father and mother; Kidney cancer in her brother; Kidney failure in her brother and father; Myocardial Infarction (Heart attack) in her brother and mother; Obesity in her mother and sister; Skin cancer in her maternal uncle and mother; Stroke in her maternal grandfather.  Social History:  reports that she quit smoking about 5 years ago. Her smoking use included cigarettes. She started smoking about 10 years  ago. She has a 5.00 pack-year smoking history. She has never used smokeless tobacco. She reports that she does not currently use alcohol. She reports that she does not use drugs.  Current Medications: has a current medication list which includes the following prescription(s): benazepril-hydrochlorthiazide, dextroamphetamine-amphetamine, rizatriptan, ergocalciferol (vitamin d2), galcanezumab-gnlm, and ibuprofen.  Allergies:  Allergies as of 06/24/2021 - Reviewed 06/24/2021 Allergen Reaction Noted  Methocarbamol Dermatitis 12/12/2014   ROS:  A 15 point review of systems was performed and was negative except as noted in HPI   Objective:    BP 130/80    Pulse 102    Ht 167.6 cm (5\' 6" )    Wt 83.5 kg (184 lb)    BMI 29.70 kg/m   Constitutional :  No distress, cooperative, alert Lymphatics/Throat:  Supple with no lymphadenopathy Respiratory:  Clear to auscultation bilaterally Cardiovascular:  Regular rate and rhythm Gastrointestinal: Soft, non-tender, non-distended, no organomegaly. Musculoskeletal: Steady gait and movement Skin: Cool and moist, no surgical scars around breasts Psychiatric: Normal affect, non-agitated, not confused Breast: Chaperone present for exam.  Left breast with bruising and hematoma s/p biopsy, but no other obvious mass or abnormalities noted bilaterally and in axilla as well.     LABS:  SURGICAL PATHOLOGY  SURGICAL PATHOLOGY  CASE: [id number]  PATIENT: Teresa Rhodes  Surgical Pathology Report      Specimen Submitted:  A. Breast, left   Clinical History: Screening detected left breast mass.  Papilloma, FA,  FCC r/o malignancy. Heart clip placed.       DIAGNOSIS:  A.  BREAST, LEFT RETROAREOLAR 3:00 1 CM FN; ULTRASOUND-GUIDED BIOPSY:  - FRAGMENTS OF INTRADUCTAL PAPILLOMA WITH USUAL DUCTAL HYPERPLASIA.  - NEGATIVE FOR ATYPIA AND MALIGNANCY.   GROSS DESCRIPTION:  A. Labeled: Left  retroareolar 3:00  Received: Formalin  Time/date in fixative:  Collected and placed in formalin at 11:45 AM on  06/16/2021  Cold ischemic time: Less than 1 minute  Total fixation time: Approximately 8.8 hours  Core pieces: 5  Size: Ranges from 0.5-1.1 cm in length and 0.1 cm in diameter  Description: Tan to yellow cores of fibrofatty tissue  Ink color: Green  Entirely submitted in 2 cassettes with 3 cores in A1 and 2 cores in A2.   [facility] 06/16/2021   Final Diagnosis performed by Quay Burow, MD.   Electronically signed  06/17/2021 9:27:18AM  The electronic signature indicates that the named Attending Pathologist  has evaluated the specimen  Technical component performed at [place], [address] [place],  [place] [address] Lab: [phone] Dir: Rush Farmer, MD, MMM   Professional component performed at [facility], [facility], [place], [place], [place] [address] Lab: [phone]  Dir: Kathi Simpers, MD     RADS: Addendum by Jamie Kato, MD on 06/18/2021  1:53 PM EST  ADDENDUM REPORT: 06/18/2021 11:53   ADDENDUM:  PATHOLOGY revealed: A. BREAST, LEFT RETROAREOLAR 3:00 1 CM FN;  ULTRASOUND-GUIDED BIOPSY: - FRAGMENTS OF INTRADUCTAL PAPILLOMA WITH  USUAL DUCTAL HYPERPLASIA. - NEGATIVE FOR ATYPIA AND MALIGNANCY   Pathology results are CONCORDANT with imaging findings, per Dr.  Ammie Ferrier with surgical consultation for excision  recommended.   Pathology results and recommendations were discussed with patient  via telephone on 06/17/2021. Patient reported biopsy site doing well  with no adverse symptoms, and only slight tenderness at the site.  Post biopsy care instructions were reviewed, questions were answered  and my direct phone number was provided. Patient was instructed to  call [facility] for any additional questions or concerns  related to biopsy site.   RECOMMENDATION: Surgical consultation for consideration of excision.  Request for surgical consultation relayed to Al Pimple RN at  [facility] by Electa Sniff RN on 06/17/2021.  CLINICAL DATA:  Patient recalled from screening for possible left  breast mass.   EXAM:  DIGITAL DIAGNOSTIC UNILATERAL LEFT MAMMOGRAM WITH TOMOSYNTHESIS AND  CAD; ULTRASOUND LEFT BREAST LIMITED   TECHNIQUE:  Left digital diagnostic mammography and breast tomosynthesis was  performed. The images were evaluated with computer-aided detection.;  Targeted ultrasound examination of the left breast was performed.   COMPARISON:  Previous exam(s).   ACR Breast Density Category b: There are scattered areas of  fibroglandular density.   FINDINGS:  There is a persistent partially obscured oval mass within the  upper-outer left breast retroareolar location.   On physical exam, no discrete retroareolar mass is palpated.   Targeted ultrasound is performed, showing a 6 x 3 x 4 mm oval  circumscribed hypoechoic mass retroareolar left breast 3 o'clock  position 1 cm from the nipple. No left axillary adenopathy.   IMPRESSION:  Indeterminate left breast mass 3 o'clock position.   RECOMMENDATION:  Ultrasound-guided core needle biopsy left breast mass 3 o'clock  position.   I have discussed the findings and recommendations with the patient.  If applicable, a reminder letter will be sent to the patient  regarding the next appointment.   BI-RADS CATEGORY  4: Suspicious.    Electronically Signed    By: Lovey Newcomer M.D.    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 Papilloma of left  breast [D24.2]  Discussed the risk of surgery including recurrence, chronic pain, post-op infxn, poor/delayed wound healing, poor cosmesis, seroma, hematoma formation, and possible re-operation to address said risks. The risks of general anesthetic, if used, includes MI, CVA, sudden death or even reaction to anesthetic medications  also discussed.  Typical post-op recovery time and possbility of activity restrictions were also discussed.  Alternatives include continued observation.  Benefits include pathologic evaluation, and/or curative excision.   The patient verbalized understanding and all questions were answered to the patient's satisfaction.  2. Patient has elected to proceed with surgical treatment. Procedure will be scheduled. Left lumpectomy after RF tag placement.  No SLNB.

## 2021-06-24 NOTE — H&P (Signed)
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 female who was referred by Idelle Crouch, MD for evaluation of above. Change was noted on last screening mammogram. Patient does not routinely do self breast exams. Age of menarche was 74. Patient denies hormonal therapy. Patient is G2P1. Patient denies nipple discharge. Patient denies previous breast biopsy. Patient denies a personal history of breast cancer.   Past Medical History:  has a past medical history of Abnormal cytology (01/2013), Anxiety (2001), Chronic idiopathic constipation, Depression (1995), Disc disease, degenerative, lumbar or lumbosacral, Foot drop, right foot, HTN, goal below 140/90, Migraine, Seizures (CMS-HCC), and Tobacco abuse.  Past Surgical History:  has a past surgical history that includes back surgery; neck surgery; and Cholecystectomy.  Family History: family history includes Alcohol abuse in her maternal grandfather and mother; Alzheimer's disease in her maternal grandmother; Anxiety in her mother and sister; Asthma in her sister; Colon cancer in her brother; Coronary Artery Disease (Blocked arteries around heart) in her maternal uncle, maternal uncle, and mother; Depression in her mother and sister; Diabetes type II in her brother, brother, maternal aunt, and maternal grandmother; Glaucoma in her mother; Heart disease in her brother and mother; High blood pressure (Hypertension) in her brother, brother, brother, father, mother, and son; Hyperlipidemia (Elevated cholesterol) in her father and mother; Kidney cancer in her brother; Kidney failure in her brother and father; Myocardial Infarction (Heart attack) in her brother and mother; Obesity in her mother and sister; Skin cancer in her maternal uncle and mother; Stroke in her maternal grandfather.  Social History:  reports that she quit smoking about 5 years ago. Her smoking use included cigarettes. She started smoking about 10 years  ago. She has a 5.00 pack-year smoking history. She has never used smokeless tobacco. She reports that she does not currently use alcohol. She reports that she does not use drugs.  Current Medications: has a current medication list which includes the following prescription(s): benazepril-hydrochlorthiazide, dextroamphetamine-amphetamine, rizatriptan, ergocalciferol (vitamin d2), galcanezumab-gnlm, and ibuprofen.  Allergies:  Allergies as of 06/24/2021 - Reviewed 06/24/2021 Allergen Reaction Noted  Methocarbamol Dermatitis 12/12/2014   ROS:  A 15 point review of systems was performed and was negative except as noted in HPI   Objective:    BP 130/80   Pulse 102   Ht 167.6 cm (5\' 6" )   Wt 83.5 kg (184 lb)   BMI 29.70 kg/m   Constitutional :  No distress, cooperative, alert Lymphatics/Throat:  Supple with no lymphadenopathy Respiratory:  Clear to auscultation bilaterally Cardiovascular:  Regular rate and rhythm Gastrointestinal: Soft, non-tender, non-distended, no organomegaly. Musculoskeletal: Steady gait and movement Skin: Cool and moist, no surgical scars around breasts Psychiatric: Normal affect, non-agitated, not confused Breast: Chaperone present for exam.  Left breast with bruising and hematoma s/p biopsy, but no other obvious mass or abnormalities noted bilaterally and in axilla as well.     LABS:  SURGICAL PATHOLOGY  SURGICAL PATHOLOGY  CASE: [id number]  PATIENT: Teresa Rhodes  Surgical Pathology Report      Specimen Submitted:  A. Breast, left   Clinical History: Screening detected left breast mass.  Papilloma, FA,  FCC r/o malignancy. Heart clip placed.       DIAGNOSIS:  A.  BREAST, LEFT RETROAREOLAR 3:00 1 CM FN; ULTRASOUND-GUIDED BIOPSY:  - FRAGMENTS OF INTRADUCTAL PAPILLOMA WITH USUAL DUCTAL HYPERPLASIA.  - NEGATIVE FOR ATYPIA AND MALIGNANCY.   GROSS DESCRIPTION:  A. Labeled: Left retroareolar 3:00  Received:  Formalin  Time/date in fixative:  Collected and placed in formalin at 11:45 AM on  06/16/2021  Cold ischemic time: Less than 1 minute  Total fixation time: Approximately 8.8 hours  Core pieces: 5  Size: Ranges from 0.5-1.1 cm in length and 0.1 cm in diameter  Description: Tan to yellow cores of fibrofatty tissue  Ink color: Green  Entirely submitted in 2 cassettes with 3 cores in A1 and 2 cores in A2.   [facility] 06/16/2021   Final Diagnosis performed by Quay Burow, MD.   Electronically signed  06/17/2021 9:27:18AM  The electronic signature indicates that the named Attending Pathologist  has evaluated the specimen  Technical component performed at [facility], [address], [place],  [place] [address] Lab: [phone] Dir: Rush Farmer, MD, MMM   Professional component performed at [facility], [facility], [address], [place], [place] [address] Lab: [phone]  Dir: Kathi Simpers, MD     RADS: Addendum by Jamie Kato, MD on 06/18/2021  1:53 PM EST  ADDENDUM REPORT: 06/18/2021 11:53   ADDENDUM:  PATHOLOGY revealed: A. BREAST, LEFT RETROAREOLAR 3:00 1 CM FN;  ULTRASOUND-GUIDED BIOPSY: - FRAGMENTS OF INTRADUCTAL PAPILLOMA WITH  USUAL DUCTAL HYPERPLASIA. - NEGATIVE FOR ATYPIA AND MALIGNANCY   Pathology results are CONCORDANT with imaging findings, per Dr.  Ammie Ferrier with surgical consultation for excision  recommended.   Pathology results and recommendations were discussed with patient  via telephone on 06/17/2021. Patient reported biopsy site doing well  with no adverse symptoms, and only slight tenderness at the site.  Post biopsy care instructions were reviewed, questions were answered  and my direct phone number was provided. Patient was instructed to  call [facility] for any additional questions or concerns  related to biopsy site.   RECOMMENDATION: Surgical consultation for consideration of excision.  Request for surgical consultation relayed to Al Pimple RN at  [facility] by Electa Sniff RN on 06/17/2021.  CLINICAL DATA:  Patient recalled from screening for possible left  breast mass.   EXAM:  DIGITAL DIAGNOSTIC UNILATERAL LEFT MAMMOGRAM WITH TOMOSYNTHESIS AND  CAD; ULTRASOUND LEFT BREAST LIMITED   TECHNIQUE:  Left digital diagnostic mammography and breast tomosynthesis was  performed. The images were evaluated with computer-aided detection.;  Targeted ultrasound examination of the left breast was performed.   COMPARISON:  Previous exam(s).   ACR Breast Density Category b: There are scattered areas of  fibroglandular density.   FINDINGS:  There is a persistent partially obscured oval mass within the  upper-outer left breast retroareolar location.   On physical exam, no discrete retroareolar mass is palpated.   Targeted ultrasound is performed, showing a 6 x 3 x 4 mm oval  circumscribed hypoechoic mass retroareolar left breast 3 o'clock  position 1 cm from the nipple. No left axillary adenopathy.   IMPRESSION:  Indeterminate left breast mass 3 o'clock position.   RECOMMENDATION:  Ultrasound-guided core needle biopsy left breast mass 3 o'clock  position.   I have discussed the findings and recommendations with the patient.  If applicable, a reminder letter will be sent to the patient  regarding the next appointment.   BI-RADS CATEGORY  4: Suspicious.    Electronically Signed    By: Lovey Newcomer M.D.    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 The risks of general anesthetic, if used, includes MI, CVA, sudden death or even reaction to anesthetic medications  also discussed.  Typical post-op recovery time and possbility of activity restrictions were also discussed.  Alternatives include continued observation.  Benefits include pathologic evaluation, and/or curative excision.   The patient verbalized understanding and all questions were answered to the patient's satisfaction.  2. Patient has elected to proceed with surgical treatment. Procedure will be scheduled. Left lumpectomy after RF tag placement.  No SLNB.

## 2021-07-10 ENCOUNTER — Encounter: Payer: Self-pay | Admitting: Surgery

## 2021-07-10 ENCOUNTER — Other Ambulatory Visit: Payer: Self-pay

## 2021-07-10 ENCOUNTER — Other Ambulatory Visit
Admission: RE | Admit: 2021-07-10 | Discharge: 2021-07-10 | Disposition: A | Discharge status: Home / Self Care | Payer: BC Managed Care – PPO | Source: Ambulatory Visit | Attending: Surgery | Admitting: Surgery

## 2021-07-10 DIAGNOSIS — I10 Essential (primary) hypertension: Secondary | ICD-10-CM

## 2021-07-10 HISTORY — DX: Pneumonia, unspecified organism: J18.9

## 2021-07-10 NOTE — Patient Instructions (Addendum)
Your procedure is scheduled on: 07/17/21 - Friday Report to the Registration Desk on the 1st floor of the [facility]. To find out your arrival time, please call [phone] between 1PM - 3PM on: 07/16/21 - Thursday Report to [facility] for labs/EKG on 07/14/21 - 3:00 pm.  REMEMBER: Instructions that are not followed completely may result in serious medical risk, up to and including death; or upon the discretion of your surgeon and anesthesiologist your surgery may need to be rescheduled.  Do not eat food after midnight the night before surgery.  No gum chewing, lozengers or hard candies.  You may however, drink CLEAR liquids up to 2 hours before you are scheduled to arrive for your surgery. Do not drink anything within 2 hours of your scheduled arrival time.  Clear liquids include: - water  - apple juice without pulp - gatorade (not RED, PURPLE, OR BLUE) - black coffee or tea (Do NOT add milk or creamers to the coffee or tea) Do NOT drink anything that is not on this list.  TAKE THESE MEDICATIONS THE MORNING OF SURGERY WITH A SIP OF WATER:  - amphetamine-dextroamphetamine (ADDERALL) 30 MG tablet  One week prior to surgery: Stop Anti-inflammatories (NSAIDS) such as Advil, Aleve, Ibuprofen, Motrin, Naproxen, Naprosyn and Aspirin based products such as Excedrin, Goodys Powder, BC Powder.  Stop ANY OVER THE COUNTER supplements until after surgery: BLACK PEPPER-TURMERIC PO, Multiple Vitamins-Minerals (MULTIVITAMIN WITH MINERALS) tablet  You may however, continue to take Tylenol if needed for pain up until the day of surgery.  No Alcohol for 24 hours before or after surgery.  No Smoking including e-cigarettes for 24 hours prior to surgery.  No chewable tobacco products for at least 6 hours prior to surgery.  No nicotine patches on the day of surgery.  Do not use any "recreational" drugs for at least a week prior to your surgery.  Please be advised that the combination of  cocaine and anesthesia may have negative outcomes, up to and including death. If you test positive for cocaine, your surgery will be cancelled.  On the morning of surgery brush your teeth with toothpaste and water, you may rinse your mouth with mouthwash if you wish. Do not swallow any toothpaste or mouthwash.  Use CHG Soap or wipes as directed on instruction sheet.  Do not wear jewelry, make-up, hairpins, clips or nail polish.  Do not wear lotions, powders, or perfumes.   Do not shave body from the neck down 48 hours prior to surgery just in case you cut yourself which could leave a site for infection.  Also, freshly shaved skin may become irritated if using the CHG soap.  Contact lenses, hearing aids and dentures may not be worn into surgery.  Do not bring valuables to the hospital. [facility] is not responsible for any missing/lost belongings or valuables.   Notify your doctor if there is any change in your medical condition (cold, fever, infection).  Wear comfortable clothing (specific to your surgery type) to the hospital.  After surgery, you can help prevent lung complications by doing breathing exercises.  Take deep breaths and cough every 1-2 hours. Your doctor may order a device called an Incentive Spirometer to help you take deep breaths. When coughing or sneezing, hold a pillow firmly against your incision with both hands. This is called splinting. Doing this helps protect your incision. It also decreases belly discomfort.  If you are being admitted to the hospital overnight, leave your suitcase in  the car. After surgery it may be brought to your room.  If you are being discharged the day of surgery, you will not be allowed to drive home. You will need a responsible adult (18 years or older) to drive you home and stay with you that night.   If you are taking public transportation, you will need to have a responsible adult (18 years or older) with you. Please confirm  with your physician that it is acceptable to use public transportation.   Please call the [facility] at [phone] if you have any questions about these instructions.  Surgery Visitation Policy:  Patients undergoing a surgery or procedure may have one family member or support person with them as long as that person is not COVID-19 positive or experiencing its symptoms.  That person may remain in the waiting area during the procedure and may rotate out with other people.  Inpatient Visitation:    Visiting hours are 7 a.m. to 8 p.m. Up to two visitors ages 16+ are allowed at one time in a patient room. The visitors may rotate out with other people during the day. Visitors must check out when they leave, or other visitors will not be allowed. One designated support person may remain overnight. The visitor must pass COVID-19 screenings, use hand sanitizer when entering and exiting the patients room and wear a mask at all times, including in the patients room. Patients must also wear a mask when staff or their visitor are in the room. Masking is required regardless of vaccination status.

## 2021-07-14 ENCOUNTER — Other Ambulatory Visit: Payer: Self-pay

## 2021-07-14 ENCOUNTER — Other Ambulatory Visit
Admission: RE | Admit: 2021-07-14 | Discharge: 2021-07-14 | Disposition: A | Discharge status: Home / Self Care | Payer: BC Managed Care – PPO | Source: Ambulatory Visit | Attending: Surgery | Admitting: Surgery

## 2021-07-14 ENCOUNTER — Encounter: Payer: Self-pay | Admitting: Urgent Care

## 2021-07-14 DIAGNOSIS — Z01818 Encounter for other preprocedural examination: Secondary | ICD-10-CM | POA: Insufficient documentation

## 2021-07-14 DIAGNOSIS — N62 Hypertrophy of breast: Secondary | ICD-10-CM | POA: Diagnosis not present

## 2021-07-14 DIAGNOSIS — Z87891 Personal history of nicotine dependence: Secondary | ICD-10-CM | POA: Diagnosis not present

## 2021-07-14 DIAGNOSIS — D242 Benign neoplasm of left breast: Secondary | ICD-10-CM | POA: Diagnosis present

## 2021-07-14 DIAGNOSIS — I10 Essential (primary) hypertension: Secondary | ICD-10-CM | POA: Insufficient documentation

## 2021-07-14 DIAGNOSIS — Z01810 Encounter for preprocedural cardiovascular examination: Secondary | ICD-10-CM | POA: Diagnosis not present

## 2021-07-14 LAB — BASIC METABOLIC PANEL
Anion gap: 7 (ref 5–15)
BUN: 6 mg/dL (ref 6–20)
CO2: 28 mmol/L (ref 22–32)
Calcium: 9.1 mg/dL (ref 8.9–10.3)
Chloride: 100 mmol/L (ref 98–111)
Creatinine, Ser: 0.75 mg/dL (ref 0.44–1.00)
GFR, Estimated: >60 mL/min (ref >60)
Glucose, Bld: 126 mg/dL — ABNORMAL HIGH (ref 70–99)
Potassium: 3.3 mmol/L — ABNORMAL LOW (ref 3.5–5.1)
Sodium: 135 mmol/L (ref 135–145)

## 2021-07-14 LAB — CBC
HCT: 43.9 % (ref 36.0–46.0)
Hemoglobin: 14.6 g/dL (ref 12.0–15.0)
MCH: 28.5 pg (ref 26.0–34.0)
MCHC: 33.3 g/dL (ref 30.0–36.0)
MCV: 85.7 fL (ref 80.0–100.0)
Platelets: 226 10*3/uL (ref 150–400)
RBC: 5.12 MIL/uL — ABNORMAL HIGH (ref 3.87–5.11)
RDW: 12.6 % (ref 11.5–15.5)
WBC: 8.1 10*3/uL (ref 4.0–10.5)
nRBC: 0 % (ref 0.0–0.2)

## 2021-07-15 ENCOUNTER — Other Ambulatory Visit: Payer: Self-pay | Admitting: Surgery

## 2021-07-15 ENCOUNTER — Ambulatory Visit
Admission: RE | Admit: 2021-07-15 | Discharge: 2021-07-15 | Disposition: A | Discharge status: Home / Self Care | Payer: BC Managed Care – PPO | Source: Ambulatory Visit

## 2021-07-15 ENCOUNTER — Ambulatory Visit
Admission: RE | Admit: 2021-07-15 | Discharge: 2021-07-15 | Disposition: A | Discharge status: Home / Self Care | Payer: BC Managed Care – PPO | Source: Ambulatory Visit | Attending: Surgery | Admitting: Surgery

## 2021-07-15 DIAGNOSIS — D242 Benign neoplasm of left breast: Secondary | ICD-10-CM | POA: Diagnosis present

## 2021-07-17 ENCOUNTER — Ambulatory Visit: Payer: BC Managed Care – PPO | Admitting: Urgent Care

## 2021-07-17 ENCOUNTER — Ambulatory Visit
Admission: RE | Admit: 2021-07-17 | Discharge: 2021-07-17 | Disposition: A | Discharge status: Home / Self Care | Payer: BC Managed Care – PPO | Attending: Surgery | Admitting: Surgery

## 2021-07-17 ENCOUNTER — Ambulatory Visit: Payer: Self-pay | Admitting: Obstetrics and Gynecology

## 2021-07-17 ENCOUNTER — Ambulatory Visit
Admission: RE | Admit: 2021-07-17 | Discharge: 2021-07-17 | Disposition: A | Discharge status: Home / Self Care | Payer: BC Managed Care – PPO | Source: Ambulatory Visit | Attending: Surgery | Admitting: Surgery

## 2021-07-17 ENCOUNTER — Other Ambulatory Visit: Payer: Self-pay

## 2021-07-17 ENCOUNTER — Encounter
Admission: RE | Disposition: A | Discharge status: Home / Self Care | Payer: Self-pay | Source: Home / Self Care | Attending: Surgery

## 2021-07-17 ENCOUNTER — Encounter: Payer: Self-pay | Admitting: Surgery

## 2021-07-17 DIAGNOSIS — D242 Benign neoplasm of left breast: Secondary | ICD-10-CM

## 2021-07-17 DIAGNOSIS — Z87891 Personal history of nicotine dependence: Secondary | ICD-10-CM | POA: Insufficient documentation

## 2021-07-17 DIAGNOSIS — N62 Hypertrophy of breast: Secondary | ICD-10-CM | POA: Insufficient documentation

## 2021-07-17 HISTORY — PX: BREAST LUMPECTOMY WITH RADIO FREQUENCY LOCALIZER: SHX6897

## 2021-07-17 LAB — POCT PREGNANCY, URINE: Preg Test, Ur: NEGATIVE

## 2021-07-17 SURGERY — BREAST LUMPECTOMY WITH RADIO FREQUENCY LOCALIZER
Anesthesia: General | Site: Breast | Laterality: Left

## 2021-07-17 MED ORDER — LACTATED RINGERS IV SOLN: INTRAVENOUS | Status: DC

## 2021-07-17 MED ORDER — LIDOCAINE HCL (PF) 2 % IJ SOLN
INTRAMUSCULAR | Status: AC
  Filled 2021-07-17: qty 5

## 2021-07-17 MED ORDER — LIDOCAINE HCL 1 % IJ SOLN
INTRAMUSCULAR | Status: DC | PRN
  Administered 2021-07-17: 08:00:00 15 mL

## 2021-07-17 MED ORDER — HYDROCODONE-ACETAMINOPHEN 5-325 MG PO TABS
1.0000 | ORAL_TABLET | Freq: Four times a day (QID) | ORAL | 0 refills | Status: DC | PRN
Start: 2021-07-17 — End: 2021-08-04

## 2021-07-17 MED ORDER — MIDAZOLAM HCL 2 MG/2ML IJ SOLN
INTRAMUSCULAR | Status: DC | PRN
  Administered 2021-07-17: 2 mg via INTRAVENOUS

## 2021-07-17 MED ORDER — ONDANSETRON HCL 4 MG/2ML IJ SOLN
INTRAMUSCULAR | Status: DC | PRN
  Administered 2021-07-17: 4 mg via INTRAVENOUS

## 2021-07-17 MED ORDER — CEFAZOLIN SODIUM-DEXTROSE 2-4 GM/100ML-% IV SOLN
2.0000 g | INTRAVENOUS | Status: AC
  Administered 2021-07-17: 08:00:00 2 g via INTRAVENOUS

## 2021-07-17 MED ORDER — ACETAMINOPHEN 325 MG PO TABS: 650.0000 mg | ORAL_TABLET | Freq: Three times a day (TID) | ORAL | 0 refills | Status: AC | PRN

## 2021-07-17 MED ORDER — GABAPENTIN 300 MG PO CAPS
ORAL_CAPSULE | ORAL | Status: AC
  Administered 2021-07-17: 07:00:00 300 mg via ORAL
  Filled 2021-07-17: qty 1

## 2021-07-17 MED ORDER — KETAMINE HCL 50 MG/5ML IJ SOSY
PREFILLED_SYRINGE | INTRAMUSCULAR | Status: AC
  Filled 2021-07-17: qty 5

## 2021-07-17 MED ORDER — DOCUSATE SODIUM 100 MG PO CAPS: 100.0000 mg | ORAL_CAPSULE | Freq: Two times a day (BID) | ORAL | 0 refills | Status: AC | PRN

## 2021-07-17 MED ORDER — PROPOFOL 10 MG/ML IV BOLUS
INTRAVENOUS | Status: AC
  Filled 2021-07-17: qty 20

## 2021-07-17 MED ORDER — KETAMINE HCL 10 MG/ML IJ SOLN
INTRAMUSCULAR | Status: DC | PRN
  Administered 2021-07-17: 20 mg via INTRAVENOUS

## 2021-07-17 MED ORDER — ONDANSETRON HCL 4 MG/2ML IJ SOLN
INTRAMUSCULAR | Status: AC
  Filled 2021-07-17: qty 2

## 2021-07-17 MED ORDER — ORAL CARE MOUTH RINSE: 15.0000 mL | Freq: Once | OROMUCOSAL | Status: AC

## 2021-07-17 MED ORDER — PHENYLEPHRINE HCL-NACL 20-0.9 MG/250ML-% IV SOLN
INTRAVENOUS | Status: AC
  Filled 2021-07-17: qty 250

## 2021-07-17 MED ORDER — CHLORHEXIDINE GLUCONATE 0.12 % MT SOLN
OROMUCOSAL | Status: AC
  Administered 2021-07-17: 07:00:00 15 mL via OROMUCOSAL
  Filled 2021-07-17: qty 15

## 2021-07-17 MED ORDER — STERILE WATER FOR IRRIGATION IR SOLN
Status: DC | PRN
  Administered 2021-07-17: 1000 mL

## 2021-07-17 MED ORDER — CHLORHEXIDINE GLUCONATE 0.12 % MT SOLN: 15.0000 mL | Freq: Once | OROMUCOSAL | Status: AC

## 2021-07-17 MED ORDER — ACETAMINOPHEN 500 MG PO TABS: 1000.0000 mg | ORAL_TABLET | ORAL | Status: AC

## 2021-07-17 MED ORDER — MIDAZOLAM HCL 2 MG/2ML IJ SOLN
INTRAMUSCULAR | Status: AC
  Filled 2021-07-17: qty 2

## 2021-07-17 MED ORDER — CHLORHEXIDINE GLUCONATE CLOTH 2 % EX PADS
6.0000 | MEDICATED_PAD | Freq: Once | CUTANEOUS | Status: AC
  Administered 2021-07-17: 07:00:00 6 via TOPICAL

## 2021-07-17 MED ORDER — DEXAMETHASONE SODIUM PHOSPHATE 10 MG/ML IJ SOLN
INTRAMUSCULAR | Status: DC | PRN
  Administered 2021-07-17: 5 mg via INTRAVENOUS

## 2021-07-17 MED ORDER — FENTANYL CITRATE (PF) 100 MCG/2ML IJ SOLN
INTRAMUSCULAR | Status: DC | PRN
  Administered 2021-07-17 (×2): 25 ug via INTRAVENOUS

## 2021-07-17 MED ORDER — FENTANYL CITRATE (PF) 100 MCG/2ML IJ SOLN
INTRAMUSCULAR | Status: AC
  Filled 2021-07-17: qty 2

## 2021-07-17 MED ORDER — CEFAZOLIN SODIUM-DEXTROSE 2-4 GM/100ML-% IV SOLN
INTRAVENOUS | Status: AC
  Filled 2021-07-17: qty 100

## 2021-07-17 MED ORDER — PROPOFOL 10 MG/ML IV BOLUS
INTRAVENOUS | Status: DC | PRN
  Administered 2021-07-17: 160 mg via INTRAVENOUS

## 2021-07-17 MED ORDER — LIDOCAINE HCL (PF) 1 % IJ SOLN
INTRAMUSCULAR | Status: AC
  Filled 2021-07-17: qty 30

## 2021-07-17 MED ORDER — MEPERIDINE HCL 25 MG/ML IJ SOLN: 6.2500 mg | INTRAMUSCULAR | Status: DC | PRN

## 2021-07-17 MED ORDER — LIDOCAINE HCL (CARDIAC) PF 100 MG/5ML IV SOSY
PREFILLED_SYRINGE | INTRAVENOUS | Status: DC | PRN
  Administered 2021-07-17: 100 mg via INTRAVENOUS

## 2021-07-17 MED ORDER — ACETAMINOPHEN 500 MG PO TABS
ORAL_TABLET | ORAL | Status: AC
  Administered 2021-07-17: 07:00:00 1000 mg via ORAL
  Filled 2021-07-17: qty 2

## 2021-07-17 MED ORDER — ONDANSETRON HCL 4 MG/2ML IJ SOLN: 4.0000 mg | Freq: Once | INTRAMUSCULAR | Status: DC | PRN

## 2021-07-17 MED ORDER — GABAPENTIN 300 MG PO CAPS: 300.0000 mg | ORAL_CAPSULE | ORAL | Status: AC

## 2021-07-17 MED ORDER — FENTANYL CITRATE (PF) 100 MCG/2ML IJ SOLN
25.0000 ug | INTRAMUSCULAR | Status: DC | PRN
  Administered 2021-07-17: 09:00:00 25 ug via INTRAVENOUS

## 2021-07-17 MED ORDER — DEXAMETHASONE SODIUM PHOSPHATE 10 MG/ML IJ SOLN
INTRAMUSCULAR | Status: AC
  Filled 2021-07-17: qty 1

## 2021-07-17 MED ORDER — IBUPROFEN 800 MG PO TABS: 800.0000 mg | ORAL_TABLET | Freq: Three times a day (TID) | ORAL | 0 refills | Status: DC | PRN

## 2021-07-17 MED ORDER — BUPIVACAINE-EPINEPHRINE (PF) 0.5% -1:200000 IJ SOLN
INTRAMUSCULAR | Status: AC
  Filled 2021-07-17: qty 30

## 2021-07-17 SURGICAL SUPPLY — 47 items
APPLIER CLIP 11 MED OPEN (CLIP)
BLADE SURG 15 STRL LF DISP TIS (BLADE) ×1 IMPLANT
BLADE SURG 15 STRL SS (BLADE) ×1
CHLORAPREP W/TINT 26 (MISCELLANEOUS) ×2 IMPLANT
CLIP APPLIE 11 MED OPEN (CLIP) IMPLANT
CNTNR SPEC 2.5X3XGRAD LEK (MISCELLANEOUS) ×1
CONT SPEC 4OZ STER OR WHT (MISCELLANEOUS) ×1
CONTAINER SPEC 2.5X3XGRAD LEK (MISCELLANEOUS) ×1 IMPLANT
DERMABOND ADVANCED (GAUZE/BANDAGES/DRESSINGS) ×1
DERMABOND ADVANCED .7 DNX12 (GAUZE/BANDAGES/DRESSINGS) ×1 IMPLANT
DEVICE DSSCT PLSMBLD 3.0S LGHT (MISCELLANEOUS) ×1 IMPLANT
DEVICE DUBIN SPECIMEN MAMMOGRA (MISCELLANEOUS) ×2 IMPLANT
DRAPE LAPAROTOMY TRNSV 106X77 (MISCELLANEOUS) ×2 IMPLANT
ELECT CAUTERY BLADE TIP 2.5 (TIP) ×2
ELECT REM PT RETURN 9FT ADLT (ELECTROSURGICAL) ×2
ELECTRODE CAUTERY BLDE TIP 2.5 (TIP) ×1 IMPLANT
ELECTRODE REM PT RTRN 9FT ADLT (ELECTROSURGICAL) ×1 IMPLANT
GAUZE 4X4 16PLY ~~LOC~~+RFID DBL (SPONGE) ×2 IMPLANT
GLOVE SURG SYN 6.5 ES PF (GLOVE) ×2 IMPLANT
GLOVE SURG SYN 6.5 PF PI (GLOVE) ×1 IMPLANT
GLOVE SURG UNDER POLY LF SZ7 (GLOVE) ×2 IMPLANT
GOWN STRL REUS W/ TWL LRG LVL3 (GOWN DISPOSABLE) ×3 IMPLANT
GOWN STRL REUS W/TWL LRG LVL3 (GOWN DISPOSABLE) ×3
JACKSON PRATT 10 (INSTRUMENTS) IMPLANT
KIT MARKER MARGIN INK (KITS) ×1 IMPLANT
KIT TURNOVER KIT A (KITS) ×2 IMPLANT
LABEL OR SOLS (LABEL) ×2 IMPLANT
LIGHT WAVEGUIDE WIDE FLAT (MISCELLANEOUS) IMPLANT
MANIFOLD NEPTUNE II (INSTRUMENTS) ×2 IMPLANT
MARKER MARGIN CORRECT CLIP (MARKER) ×2 IMPLANT
NEEDLE HYPO 22GX1.5 SAFETY (NEEDLE) ×4 IMPLANT
PACK BASIN MINOR ARMC (MISCELLANEOUS) ×2 IMPLANT
PLASMABLADE 3.0S W/LIGHT (MISCELLANEOUS) ×2
SET LOCALIZER 20 PROBE US (MISCELLANEOUS) ×2 IMPLANT
SUT MNCRL 4-0 (SUTURE) ×1
SUT MNCRL 4-0 27XMFL (SUTURE) ×1
SUT SILK 2 0 (SUTURE) ×1
SUT SILK 2-0 30XBRD TIE 12 (SUTURE) IMPLANT
SUT SILK 3 0 12 30 (SUTURE) IMPLANT
SUT VIC AB 3-0 SH 27 (SUTURE) ×1
SUT VIC AB 3-0 SH 27X BRD (SUTURE) ×2 IMPLANT
SUTURE MNCRL 4-0 27XMF (SUTURE) ×2 IMPLANT
SYR 20ML LL LF (SYRINGE) ×2 IMPLANT
TRAP NEPTUNE SPECIMEN COLLECT (MISCELLANEOUS) ×2 IMPLANT
TUBING CONNECTING 10 (TUBING) ×2 IMPLANT
WATER STERILE IRR 1000ML POUR (IV SOLUTION) ×2 IMPLANT
WATER STERILE IRR 500ML POUR (IV SOLUTION) ×2 IMPLANT

## 2021-07-17 NOTE — Anesthesia Procedure Notes (Signed)
Procedure Name: LMA Insertion Date/Time: 07/17/2021 7:38 AM Performed by: Lia Foyer, CRNA Pre-anesthesia Checklist: Patient identified, Emergency Drugs available, Suction available and Patient being monitored Patient Re-evaluated:Patient Re-evaluated prior to induction Oxygen Delivery Method: Circle system utilized Preoxygenation: Pre-oxygenation with 100% oxygen Induction Type: IV induction Ventilation: Mask ventilation without difficulty LMA: LMA flexible inserted LMA Size: 3.5 Tube type: Oral Number of attempts: 1 Airway Equipment and Method: Oral airway Placement Confirmation: positive ETCO2 and breath sounds checked- equal and bilateral Tube secured with: Tape Dental Injury: Teeth and Oropharynx as per pre-operative assessment

## 2021-07-17 NOTE — Interval H&P Note (Signed)
No change. OK to proceed.

## 2021-07-17 NOTE — Discharge Instructions (Addendum)
Removal, Care After This sheet gives you information about how to care for yourself after your procedure. Your health care provider may also give you more specific instructions. If you have problems or questions, contact your health care provider. What can I expect after the procedure? After the procedure, it is common to have: Soreness. Bruising. Itching. Follow these instructions at home: site care Follow instructions from your health care provider about how to take care of your site. Make sure you: Wash your hands with soap and water before and after you change your bandage (dressing). If soap and water are not available, use hand sanitizer. Leave stitches (sutures), skin glue, or adhesive strips in place. These skin closures may need to stay in place for 2 weeks or longer. If adhesive strip edges start to loosen and curl up, you may trim the loose edges. Do not remove adhesive strips completely unless your health care provider tells you to do that. If the area bleeds or bruises, apply gentle pressure for 10 minutes. OK TO SHOWER IN 24HRS  Check your site every day for signs of infection. Check for: Redness, swelling, or pain. Fluid or blood. Warmth. Pus or a bad smell.  General instructions Rest and then return to your normal activities as told by your health care provider.  tylenol and advil as needed for discomfort.  Please alternate between the two every four hours as needed for pain.    Use narcotics, if prescribed, only when tylenol and motrin is not enough to control pain.  325-650mg every 8hrs to max of 3000mg/24hrs (including the 325mg in every norco dose) for the tylenol.    Advil up to 800mg per dose every 8hrs as needed for pain.   Keep all follow-up visits as told by your health care provider. This is important. Contact a health care provider if: You have redness, swelling, or pain around your site. You have fluid or blood coming from your site. Your site feels warm to  the touch. You have pus or a bad smell coming from your site. You have a fever. Your sutures, skin glue, or adhesive strips loosen or come off sooner than expected. Get help right away if: You have bleeding that does not stop with pressure or a dressing. Summary After the procedure, it is common to have some soreness, bruising, and itching at the site. Follow instructions from your health care provider about how to take care of your site. Check your site every day for signs of infection. Contact a health care provider if you have redness, swelling, or pain around your site, or your site feels warm to the touch. Keep all follow-up visits as told by your health care provider. This is important. This information is not intended to replace advice given to you by your health care provider. Make sure you discuss any questions you have with your health care provider. Document Released: 08/01/2015 Document Revised: 01/02/2018 Document Reviewed: 01/02/2018 Elsevier Interactive Patient Education  2019 [employer]  AMBULATORY SURGERY  DISCHARGE INSTRUCTIONS   The drugs that you were given will stay in your system until tomorrow so for the next 24 hours you should not:  Drive an automobile Make any legal decisions Drink any alcoholic beverage   You may resume regular meals tomorrow.  Today it is better to start with liquids and gradually work up to solid foods.  You may eat anything you prefer, but it is better to start with liquids, then soup and crackers, and gradually   work up to solid foods.   Please notify your doctor immediately if you have any unusual bleeding, trouble breathing, redness and pain at the surgery site, drainage, fever, or pain not relieved by medication.    Additional Instructions:        Please contact your physician with any problems or Same Day Surgery at [phone], Monday through Friday 6 am to 4 pm, or [place] [facility] Main number at  [phone]. 

## 2021-07-17 NOTE — Op Note (Signed)
Preoperative diagnosis:  left breast papilloma.  Postoperative diagnosis: same.   Procedure: RF tag-localized left breast partial mastectomy.                       Anesthesia: LMA  Surgeon: Dr. Benjamine Sprague  Wound Classification: Clean  Indications: see H&P  Specimen: lef Breast mass,   Complications: None  Estimated Blood Loss: 59mL  Findings: 1. Specimen mammography shows marker and RF localizer on specimen 2. Pathology call refers gross examination of margins was negative 3. No other palpable mass or lymph node identified.    Description of procedure: RF localization was performed by radiology prior to procedure.  The patient was taken to the operating room and placed supine on the operating table, and after general anesthesia the left breast and axilla were prepped and draped in the usual sterile fashion. A time-out was completed verifying correct patient, procedure, site, positioning, and implant(s) and/or special equipment prior to beginning this procedure.  By identifying the RF localizer, the probable trajectory and location of the mass was visualized. A skin incision was planned in such a way as to minimize the amount of dissection to reach the mass.  The skin incision was made after infusion of local. Flaps were raised and  Sharp and blunt dissection was then taken down to the mass, taking care to include the entire RF localizer and a margin of grossly normal tissue. The specimen was removed. The specimen was oriented with paint and sent to radiology with the localization studies. Confirmation was received that the entire target lesion had been resected. Wound irrigated, hemostasis was achieved and the wound closed in layers with  interrupted sutures of 3-0 Vicryl in deep dermal layer and a running subcuticular suture of Monocryl 4-0, then dressed with dermabond. The patient tolerated the procedure well and was taken to the postanesthesia care unit in stable condition. Sponge  and instrument count correct at end of procedure.

## 2021-07-17 NOTE — Anesthesia Preprocedure Evaluation (Signed)
Anesthesia Evaluation  Patient identified by MRN, date of birth, ID band Patient awake    Reviewed: Allergy & Precautions, NPO status , Patient's Chart, lab work & pertinent test results  History of Anesthesia Complications (+) history of anesthetic complications (Intraop awareness)  Airway Mallampati: II  TM Distance: >3 FB Neck ROM: Full    Dental no notable dental hx.    Pulmonary pneumonia, resolved, former smoker,    Pulmonary exam normal        Cardiovascular hypertension, Pt. on medications negative cardio ROS Normal cardiovascular exam     Neuro/Psych  Headaches, Seizures - (10 years ago, no meds now), Well Controlled,  PSYCHIATRIC DISORDERS Depression  Neuromuscular disease    GI/Hepatic negative GI ROS, Neg liver ROS,   Endo/Other  negative endocrine ROS  Renal/GU negative Renal ROS  negative genitourinary   Musculoskeletal  (+) Arthritis ,   Abdominal   Peds negative pediatric ROS (+)  Hematology negative hematology ROS (+)   Anesthesia Other Findings Complication of anesthesia  a.) early emergence during neck surgery  Depression    Gallstones    Hypertension    Migraine    Pneumonia    Seizures ([place]) 2010-2013 Brain seizures     Reproductive/Obstetrics negative OB ROS                            Anesthesia Physical Anesthesia Plan  ASA: 2  Anesthesia Plan: General   Post-op Pain Management:    Induction: Intravenous  PONV Risk Score and Plan: 2 and Ondansetron, Propofol infusion and Midazolam  Airway Management Planned: LMA  Additional Equipment:   Intra-op Plan:   Post-operative Plan:   Informed Consent: I have reviewed the patients History and Physical, chart, labs and discussed the procedure including the risks, benefits and alternatives for the proposed anesthesia with the patient or authorized representative who has indicated his/her understanding and  acceptance.       Plan Discussed with: CRNA, Anesthesiologist and Surgeon  Anesthesia Plan Comments:         Anesthesia Quick Evaluation

## 2021-07-17 NOTE — Transfer of Care (Signed)
Immediate Anesthesia Transfer of Care Note  Patient: Teresa Rhodes  Procedure(s) Performed: BREAST LUMPECTOMY WITH RADIO FREQUENCY LOCALIZER (Left: Breast)  Patient Location: PACU  Anesthesia Type:General  Level of Consciousness: drowsy  Airway & Oxygen Therapy: Patient Spontanous Breathing  Post-op Assessment: Report given to RN and Post -op Vital signs reviewed and stable  Post vital signs: Reviewed and stable  Last Vitals:  Vitals Value Taken Time  BP 151/85 07/17/21 0836  Temp    Pulse 85 07/17/21 0837  Resp 18 07/17/21 0837  SpO2 99 % 07/17/21 0837  Vitals shown include unvalidated device data.  Last Pain:  Vitals:   07/17/21 0653  TempSrc: Tympanic  PainSc: 0-No pain      Patients Stated Pain Goal: 0 (27/63/94 3200)  Complications: No notable events documented.

## 2021-07-17 NOTE — Anesthesia Postprocedure Evaluation (Signed)
Anesthesia Post Note  Patient: Teresa Rhodes  Procedure(s) Performed: BREAST LUMPECTOMY WITH RADIO FREQUENCY LOCALIZER (Left: Breast)  Patient location during evaluation: PACU Anesthesia Type: General Level of consciousness: awake and alert, awake and oriented Pain management: pain level controlled Vital Signs Assessment: post-procedure vital signs reviewed and stable Respiratory status: spontaneous breathing, nonlabored ventilation and respiratory function stable Cardiovascular status: blood pressure returned to baseline and stable Postop Assessment: no apparent nausea or vomiting Anesthetic complications: no   No notable events documented.   Last Vitals:  Vitals:   07/17/21 0900 07/17/21 0917  BP: 132/73 (!) 151/93  Pulse: 73 70  Resp: 13 18  Temp:  (!) 36.2 C  SpO2: 100% 97%    Last Pain:  Vitals:   07/17/21 0917  TempSrc: Temporal  PainSc: 3                  Phill Mutter

## 2021-07-22 LAB — SURGICAL PATHOLOGY

## 2021-07-30 NOTE — Progress Notes (Signed)
PCP:  Idelle Crouch, MD   Chief Complaint  Patient presents with   Gynecologic Exam    Pain during and after intercourse x 8 months     HPI:      Ms. Teresa Rhodes is a 51 y.o. G2P1011 who LMP was No LMP recorded (lmp unknown). Patient is perimenopausal., presents today for her annual examination.  Her menses are absent now with endometrial ablation, used to be occas, light spotting for a few days. Dysmenorrhea none. She does not have intermenstrual bleeding.   Sex activity: single partner, husband; contraception - vasectomy. No vag dryness. Having sharp, stabbing pains now during sex, for the past yr. Not every time but in certain positions. Sometimes feels like "insides are falling out" after sex for 10-20 min. No bleeding with sex.  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 There is no FH of breast cancer. There is no FH of ovarian cancer. The patient does do self-breast exams.   Tobacco use: The patient denies current or previous tobacco use. Alcohol use: none No drug use.  Exercise: moderately active  Colonoscopy: 2018 with hemorrhoids with Dr. Vicente Males; s/p EGD with bx. Unsure when colonoscopy due again due to [place] colon cancer in her brother.  She does get adequate calcium and Vitamin D in her diet. Labs with PCP.   Patient Active Problem List   Diagnosis Date Noted   Seizure-like activity ([place]) 03/12/2019   Tingling 01/15/2019   Tingling in extremities 11/11/2018   Climacteric 06/21/2018   Chronic neck pain 05/25/2018   Chronic upper extremity pain 05/25/2018   Chronic bilateral low back pain with bilateral sciatica 05/25/2018   Chronic pain of both lower extremities 05/25/2018   Right foot drop 05/25/2018   Nausea & vomiting 04/07/2017   Seizure ([place]) 12/01/2016   Deep vein thrombosis (DVT) of  left lower extremity ([place]) 11/23/2016   Leg swelling 11/23/2016   Pinched nerve 11/23/2016   Right leg pain 11/23/2016   Difficulty sleeping 10/20/2016   Headache disorder 10/20/2016   Chronic idiopathic constipation 12/04/2015   Degeneration of intervertebral disc of lumbosacral region 09/12/2015   Headache, migraine 09/12/2015   Current tobacco use 09/12/2015   Back pain 09/11/2015   Lumbosacral radiculopathy 07/22/2014   White matter abnormality on MRI of brain 06/12/2014   Foot drop 06/12/2014   Right arm weakness 06/12/2014    Past Surgical History:  Procedure Laterality Date   BACK SURGERY     BREAST LUMPECTOMY WITH RADIO FREQUENCY LOCALIZER Left 07/17/2021   Procedure: BREAST LUMPECTOMY WITH RADIO FREQUENCY LOCALIZER;  Surgeon: Benjamine Sprague, DO;  Location: [facility] ORS;  Service: General;  Laterality: Left;   CHOLECYSTECTOMY  2002   COLONOSCOPY WITH PROPOFOL N/A 04/10/2017   Procedure: COLONOSCOPY WITH PROPOFOL;  Surgeon: Jonathon Bellows, MD;  Location: [facility] END[facility]COPY;  Service: Gastroenterology;  Laterality: N/A;   ESOPHAGOGASTRODUODENOSCOPY (EGD) WITH PROPOFOL N/A 04/10/2017   Procedure: ESOPHAGOGASTRODUODENOSCOPY (EGD) WITH PROPOFOL;  Surgeon: Jonathon Bellows, MD;  Location: [facility] ENDOSCOPY;  Service: Gastroenterology;  Laterality: N/A;   NECK SURGERY  2005 & 2009   x2   WISDOM TOOTH EXTRACTION      Family History  Problem Relation Age of Onset   Ulcerative colitis Mother    Ovarian cysts Mother    Kidney disease Father 71   Kidney  disease Brother 74   Colon cancer Brother 68   Liver disease Maternal Grandfather    Colon polyps Neg Hx    Esophageal cancer Neg Hx    Kidney cancer Neg Hx    Bladder Cancer Neg Hx    Breast cancer Neg Hx     Social History   Socioeconomic History   Marital status: Married    Spouse name: Not on file   Number of children: 1   Years of education: Not on file   Highest education level: Not on file  Occupational History   Occupation:  Civil engineer, contracting  Tobacco Use   Smoking status: Former    Packs/day: 0.50    Years: 3.00    Pack years: 1.50    Types: Cigarettes    Quit date: 10/24/2014    Years since quitting: 6.7   Smokeless tobacco: Never   Tobacco comments:    pt given handout  Vaping Use   Vaping Use: Never used  Substance and Sexual Activity   Alcohol use: No    Alcohol/week: 0.0 standard drinks   Drug use: No   Sexual activity: Yes    Birth control/protection: None  Other Topics Concern   Not on file  Social History Narrative   Not on file   Social Determinants of Health   Financial Resource Strain: Not on file  Food Insecurity: Not on file  Transportation Needs: Not on file  Physical Activity: Not on file  Stress: Not on file  Social Connections: Not on file  Intimate Partner Violence: Not on file     Current Outpatient Medications:    acetaminophen (TYLENOL) 325 MG tablet, Take 2 tablets (650 mg total) by mouth every 8 (eight) hours as needed for mild pain., Disp: 40 tablet, Rfl: 0   amphetamine-dextroamphetamine (ADDERALL) 30 MG tablet, Take 60 mg by mouth daily., Disp: , Rfl:    baclofen (LIORESAL) 10 MG tablet, Take 10 mg by mouth 2 (two) times daily., Disp: , Rfl:    benazepril-hydrochlorthiazide (LOTENSIN HCT) 20-12.5 MG tablet, Take 1 tablet by mouth 2 (two) times a day., Disp: , Rfl:    BLACK PEPPER-TURMERIC PO, Take 1 drop by mouth in the morning, at noon, and at bedtime., Disp: , Rfl:    Multiple Vitamins-Minerals (MULTIVITAMIN WITH MINERALS) tablet, Take 1 tablet by mouth daily., Disp: , Rfl:    rizatriptan (MAXALT) 10 MG tablet, Take one full tab at first sign of migraine headache. May repeat once after 2 hrs. Max 2 tabs per 24 hrs., Disp: , Rfl:    Vitamin D, Ergocalciferol, (DRISDOL) 1.25 MG (50000 UT) CAPS capsule, Take 50,000 Units by mouth every Sunday., Disp: , Rfl: 11     ROS:  Review of Systems  Constitutional:  Negative for fatigue, fever and unexpected weight change.   Respiratory:  Negative for cough, shortness of breath and wheezing.   Cardiovascular:  Negative for chest pain, palpitations and leg swelling.  Gastrointestinal:  Positive for constipation. Negative for blood in stool, diarrhea, nausea and vomiting.  Endocrine: Negative for cold intolerance, heat intolerance and polyuria.  Genitourinary:  Negative for dyspareunia, dysuria, flank pain, frequency, genital sores, hematuria, menstrual problem, pelvic pain, urgency, vaginal bleeding, vaginal discharge and vaginal pain.  Musculoskeletal:  Positive for arthralgias and joint swelling. Negative for back pain and myalgias.  Skin:  Negative for rash.  Neurological:  Positive for headaches. Negative for dizziness, syncope, light-headedness and numbness.  Hematological:  Negative for adenopathy.  Psychiatric/Behavioral:  Negative for agitation, confusion, sleep disturbance and suicidal ideas. The patient is not nervous/anxious.  BREAST: tenderness   Objective: BP 130/70    Ht 5\' 8"  (1.727 m)    Wt 186 lb (84.4 kg)    LMP  (LMP Unknown)    BMI 28.28 kg/m    Physical Exam Constitutional:      Appearance: She is well-developed.  Genitourinary:     Vulva normal.     Genitourinary Comments: CERVIX TENDER TO TOUCH, CAUSING PELVIC PAIN; NOT CMT PER SE     Right Labia: No rash, tenderness or lesions.    Left Labia: No tenderness, lesions or rash.    No vaginal discharge, erythema or tenderness.      Right Adnexa: not tender and no mass present.    Left Adnexa: not tender and no mass present.    No cervical friability or polyp.     Uterus is tender.     Uterus is not enlarged.  Breasts:    Right: Tenderness present. No mass, nipple discharge or skin change.     Left: Tenderness present. No mass, nipple discharge or skin change.  Neck:     Thyroid: No thyromegaly.  Cardiovascular:     Rate and Rhythm: Normal rate and regular rhythm.     Heart sounds: Normal heart sounds. No murmur  heard. Pulmonary:     Effort: Pulmonary effort is normal.     Breath sounds: Normal breath sounds.  Abdominal:     Palpations: Abdomen is soft.     Tenderness: There is abdominal tenderness in the right lower quadrant, suprapubic area and left lower quadrant. There is no guarding or rebound.  Musculoskeletal:        General: Normal range of motion.     Cervical back: Normal range of motion.  Lymphadenopathy:     Cervical: No cervical adenopathy.  Neurological:     General: No focal deficit present.     Mental Status: She is alert and oriented to person, place, and time.     Cranial Nerves: No cranial nerve deficit.  Skin:    General: Skin is warm and dry.  Psychiatric:        Mood and Affect: Mood normal.        Behavior: Behavior normal.        Thought Content: Thought content normal.        Judgment: Judgment normal.  Vitals reviewed.    Assessment/Plan: Encounter for annual routine gynecological examination  Encounter for screening mammogram for malignant neoplasm of breast; pt current on mammo; s/p breast mass exc.   Screening for colon cancer--pt to f/u with [facility] as to when due again.   Dyspareunia in female - Plan: US PELVIS TRANSVAGINAL NON-OB (TV ONLY); tender on exam, check GYN u/s. Will f/u with results. If neg, may treat for PID given tenderness on exam.             GYN counsel breast self exam, mammography screening, adequate intake of calcium and vitamin D, diet and exercise     F/U  Return in about 1 year (around 08/04/2022) for GYN u/s 08/20/21 for dyspareunia--[facility] to call pt, annual.  Dajon Rowe B. Ranay Ketter, PA-C 08/04/2021 10:17 AM

## 2021-08-04 ENCOUNTER — Other Ambulatory Visit: Payer: Self-pay

## 2021-08-04 ENCOUNTER — Encounter: Payer: Self-pay | Admitting: Obstetrics and Gynecology

## 2021-08-04 ENCOUNTER — Ambulatory Visit (INDEPENDENT_AMBULATORY_CARE_PROVIDER_SITE_OTHER): Payer: BC Managed Care – PPO | Admitting: Obstetrics and Gynecology

## 2021-08-04 VITALS — BP 130/70 | Ht 68.0 in | Wt 186.0 lb

## 2021-08-04 DIAGNOSIS — N941 Unspecified dyspareunia: Secondary | ICD-10-CM

## 2021-08-04 DIAGNOSIS — Z1211 Encounter for screening for malignant neoplasm of colon: Secondary | ICD-10-CM | POA: Diagnosis not present

## 2021-08-04 DIAGNOSIS — Z01419 Encounter for gynecological examination (general) (routine) without abnormal findings: Secondary | ICD-10-CM

## 2021-08-04 DIAGNOSIS — Z1231 Encounter for screening mammogram for malignant neoplasm of breast: Secondary | ICD-10-CM

## 2021-08-04 NOTE — Patient Instructions (Signed)
I value your feedback and you entrusting us with your care. If you get a [place] patient survey, I would appreciate you taking the time to let us know about your experience today. Thank you! ? ? ?

## 2021-08-20 ENCOUNTER — Other Ambulatory Visit: Payer: Self-pay

## 2021-08-20 ENCOUNTER — Other Ambulatory Visit: Payer: Self-pay | Admitting: Obstetrics and Gynecology

## 2021-08-20 ENCOUNTER — Ambulatory Visit (INDEPENDENT_AMBULATORY_CARE_PROVIDER_SITE_OTHER): Payer: BC Managed Care – PPO

## 2021-08-20 DIAGNOSIS — N941 Unspecified dyspareunia: Secondary | ICD-10-CM

## 2021-08-21 ENCOUNTER — Encounter: Payer: Self-pay | Admitting: Obstetrics and Gynecology

## 2021-08-22 ENCOUNTER — Other Ambulatory Visit: Payer: Self-pay | Admitting: Obstetrics and Gynecology

## 2021-08-22 MED ORDER — NITROFURANTOIN MONOHYD MACRO 100 MG PO CAPS: 100.0000 mg | ORAL_CAPSULE | Freq: Two times a day (BID) | ORAL | 0 refills | Status: AC

## 2021-08-22 NOTE — Progress Notes (Signed)
Rx macrobid for UTI sx.

## 2022-02-17 ENCOUNTER — Encounter: Payer: Self-pay | Admitting: Emergency Medicine

## 2022-02-17 ENCOUNTER — Other Ambulatory Visit: Payer: Self-pay

## 2022-02-17 ENCOUNTER — Emergency Department: Payer: BC Managed Care – PPO

## 2022-02-17 ENCOUNTER — Emergency Department
Admission: EM | Admit: 2022-02-17 | Discharge: 2022-02-17 | Disposition: A | Discharge status: Home / Self Care | Payer: BC Managed Care – PPO | Attending: Emergency Medicine | Admitting: Emergency Medicine

## 2022-02-17 DIAGNOSIS — R109 Unspecified abdominal pain: Secondary | ICD-10-CM | POA: Insufficient documentation

## 2022-02-17 DIAGNOSIS — R519 Headache, unspecified: Secondary | ICD-10-CM | POA: Diagnosis not present

## 2022-02-17 DIAGNOSIS — I10 Essential (primary) hypertension: Secondary | ICD-10-CM | POA: Insufficient documentation

## 2022-02-17 DIAGNOSIS — R202 Paresthesia of skin: Secondary | ICD-10-CM | POA: Diagnosis not present

## 2022-02-17 DIAGNOSIS — R531 Weakness: Secondary | ICD-10-CM | POA: Diagnosis not present

## 2022-02-17 DIAGNOSIS — R61 Generalized hyperhidrosis: Secondary | ICD-10-CM | POA: Insufficient documentation

## 2022-02-17 DIAGNOSIS — R110 Nausea: Secondary | ICD-10-CM | POA: Insufficient documentation

## 2022-02-17 LAB — URINALYSIS, ROUTINE W REFLEX MICROSCOPIC
Bilirubin Urine: NEGATIVE
Glucose, UA: NEGATIVE mg/dL
Hgb urine dipstick: NEGATIVE
Ketones, ur: 5 mg/dL — AB
Leukocytes,Ua: NEGATIVE
Nitrite: NEGATIVE
Protein, ur: 30 mg/dL — AB
Specific Gravity, Urine: 1.018 (ref 1.005–1.030)
pH: 8 (ref 5.0–8.0)

## 2022-02-17 LAB — COMPREHENSIVE METABOLIC PANEL WITH GFR
ALT: 10 U/L (ref 0–44)
AST: 15 U/L (ref 15–41)
Albumin: 4.5 g/dL (ref 3.5–5.0)
Alkaline Phosphatase: 79 U/L (ref 38–126)
Anion gap: 7 (ref 5–15)
BUN: 10 mg/dL (ref 6–20)
CO2: 29 mmol/L (ref 22–32)
Calcium: 9.7 mg/dL (ref 8.9–10.3)
Chloride: 102 mmol/L (ref 98–111)
Creatinine, Ser: 0.6 mg/dL (ref 0.44–1.00)
GFR, Estimated: >60 mL/min
Glucose, Bld: 125 mg/dL — ABNORMAL HIGH (ref 70–99)
Potassium: 3.7 mmol/L (ref 3.5–5.1)
Sodium: 138 mmol/L (ref 135–145)
Total Bilirubin: 0.9 mg/dL (ref 0.3–1.2)
Total Protein: 7.8 g/dL (ref 6.5–8.1)

## 2022-02-17 LAB — CBC
HCT: 45.7 % (ref 36.0–46.0)
Hemoglobin: 15 g/dL (ref 12.0–15.0)
MCH: 28 pg (ref 26.0–34.0)
MCHC: 32.8 g/dL (ref 30.0–36.0)
MCV: 85.3 fL (ref 80.0–100.0)
Platelets: 278 10*3/uL (ref 150–400)
RBC: 5.36 MIL/uL — ABNORMAL HIGH (ref 3.87–5.11)
RDW: 12.7 % (ref 11.5–15.5)
WBC: 19.8 10*3/uL — ABNORMAL HIGH (ref 4.0–10.5)
nRBC: 0 % (ref 0.0–0.2)

## 2022-02-17 LAB — LIPASE, BLOOD: Lipase: 29 U/L (ref 11–51)

## 2022-02-17 LAB — TROPONIN I (HIGH SENSITIVITY): Troponin I (High Sensitivity): 3 ng/L

## 2022-02-17 MED ORDER — DIAZEPAM 5 MG PO TABS
5.0000 mg | ORAL_TABLET | Freq: Once | ORAL | Status: AC
  Administered 2022-02-17: 5 mg via ORAL
  Filled 2022-02-17: qty 1

## 2022-02-17 NOTE — ED Provider Notes (Signed)
[facility] Provider Note    Event Date/Time   First MD Initiated Contact with Patient 02/17/22 2128     (approximate)  History   Chief Complaint: Nausea  HPI  Teresa Rhodes is a 51 y.o. female with a past medical history of depression, anxiety, hypertension, presents to the emergency department for medical evaluation.  According to the patient earlier this afternoon states she was at home had just come back from a massage when all of a sudden she felt like she had to use the restroom to have a bowel movement.  Patient states she developed abdominal cramping began experiencing cramping in both of her hands or feet as well as tingling in her hands and feet.  Patient then states it progressed to a sensation of pain and tingling throughout her body she called her husband.  Husband states when he got home the patient had taken off her close was diaphoretic.  Patient states a history of panic attacks but states she has not had a severe panic attack for at least 15 years.  Patient states she has headache and feels a sensation of generalized weakness now.  Patient denies any focal weakness or numbness.  Patient denies any chest pain at any point.  States abdominal cramping while she felt like she had to use the bathroom but none since.  Patient states a history of chronic pain so she states that is a difficult question for her.  Physical Exam   Triage Vital Signs: ED Triage Vitals  Enc Vitals Group     BP 02/17/22 1751 115/83     Pulse Rate 02/17/22 1751 83     Resp 02/17/22 1751 18     Temp 02/17/22 1751 98.6 F (37 C)     Temp Source 02/17/22 1751 Oral     SpO2 02/17/22 1751 100 %     Weight 02/17/22 1753 186 lb (84.4 kg)     Height 02/17/22 1753 '5\' 8"'$  (1.727 m)     Head Circumference --      Peak Flow --      Pain Score 02/17/22 1752 0     Pain Loc --      Pain Edu? --      Excl. in [place]? --     Most recent vital signs: Vitals:   02/17/22 1751 02/17/22  2155  BP: 115/83 116/77  Pulse: 83 78  Resp: 18 18  Temp: 98.6 F (37 C) 98.4 F (36.9 C)  SpO2: 100% 99%    General: Awake, no distress.  Does appear quite anxious. CV:  Good peripheral perfusion.  Regular rate and rhythm  Resp:  Normal effort.  Equal breath sounds bilaterally.  Abd:  No distention.  Soft, nontender.  No rebound or guarding. Other:  Equal grip strength bilaterally 5/5 strength in all extremities.  No pronator drift.  No cranial nerve deficits.   ED Results / Procedures / Treatments   EKG  EKG viewed and interpreted by myself shows a normal sinus rhythm 85 bpm with a narrow QRS, normal axis, normal intervals, nonspecific but no concerning ST changes.  RADIOLOGY  I have reviewed and interpreted the CT head images.  I do not see any obvious large bleed on my evaluation. Radiology is read the CT scan as negative for acute abnormality.  Chronic findings.  MEDICATIONS ORDERED IN ED: Medications  diazepam (VALIUM) tablet 5 mg (5 mg Oral Given 02/17/22 2152)     IMPRESSION /  MDM / ASSESSMENT AND PLAN / ED COURSE  I reviewed the triage vital signs and the nursing notes.  Patient's presentation is most consistent with acute presentation with potential threat to life or bodily function.  Patient presents to the emergency department with acute onset of tingling and cramping throughout her body.  Diaphoresis per patient.  States she felt like something very bad was happening.  Husband states the patient.  Very diaphoretic very uneasy had stripped off her clothes.  Patient's description seems like it could be suggestive of a panic attack, however given her headache we will also obtain CT imaging of the head as a precaution to rule out intracranial abnormality.  Patient CT scan is negative.  Patient's troponin is negative.  Patient states he is feeling better still has a mild headache.  Patient appears much more calm.  Highly suspect anxiety.  Given the patient's otherwise  reassuring work-up we will discharge the patient home with PCP follow-up.  Patient will call her PCP tomorrow.  FINAL CLINICAL IMPRESSION(S) / ED DIAGNOSES   Weakness    Note:  This document was prepared using Dragon voice recognition software and may include unintentional dictation errors.   Harvest Dark, MD 02/17/22 [phone]

## 2022-02-17 NOTE — ED Notes (Signed)
Patient verbalized discharge understanding and denied any further questions for RN or provider

## 2022-02-17 NOTE — ED Triage Notes (Signed)
First Nurse Note:  Pt via EMS from home. Pt c/o sudden onset nausea and diarrhea and sweating. Pt stated a Cefidinir 2 days ago. Pt has only had 2 doses. Symptoms started around 2:30pm today. Pt is A&Ox4 and NAD  107/63  98% on R 84 HR  141 CBG

## 2022-02-17 NOTE — ED Triage Notes (Signed)
Patient states today after getting a massage and ice cream she had a sudden onset of nausea, sweating and diarrhea. States her arms locked up and felt like she was being electrocuted. Patient saw PCP yesterday and started on antibiotics for ear infection.

## 2022-02-19 ENCOUNTER — Ambulatory Visit
Admission: RE | Admit: 2022-02-19 | Discharge: 2022-02-19 | Disposition: A | Discharge status: Home / Self Care | Payer: BC Managed Care – PPO | Source: Ambulatory Visit | Attending: Family Medicine | Admitting: Family Medicine

## 2022-02-19 ENCOUNTER — Other Ambulatory Visit: Payer: Self-pay | Admitting: Family Medicine

## 2022-02-19 DIAGNOSIS — R1032 Left lower quadrant pain: Secondary | ICD-10-CM | POA: Diagnosis present

## 2022-02-19 DIAGNOSIS — R1084 Generalized abdominal pain: Secondary | ICD-10-CM | POA: Diagnosis present

## 2022-02-19 MED ORDER — IOHEXOL 300 MG/ML  SOLN
100.0000 mL | Freq: Once | INTRAMUSCULAR | Status: AC | PRN
  Administered 2022-02-19: 100 mL via INTRAVENOUS

## 2022-03-10 NOTE — Progress Notes (Unsigned)
Referring-Jeffrey Doy Hutching, MD Reason for referral-palpitations  HPI: 51 year old female for evaluation of palpitations at request of Fulton Reek, MD.  Echocardiogram October 2017 showed normal LV function, trace aortic and mitral regurgitation and mild tricuspid regurgitation.  TSH March 2023 1.788.  Patient was seen in the emergency room August 2 with abdominal cramping and possible anxiety.  Troponin was normal.  Creatinine 0.6, hemoglobin 15.  Current Outpatient Medications  Medication Sig Dispense Refill   amphetamine-dextroamphetamine (ADDERALL) 30 MG tablet Take 60 mg by mouth daily.     baclofen (LIORESAL) 10 MG tablet Take 10 mg by mouth 2 (two) times daily.     benazepril-hydrochlorthiazide (LOTENSIN HCT) 20-12.5 MG tablet Take 1 tablet by mouth 2 (two) times a day.     BLACK PEPPER-TURMERIC PO Take 1 drop by mouth in the morning, at noon, and at bedtime.     Multiple Vitamins-Minerals (MULTIVITAMIN WITH MINERALS) tablet Take 1 tablet by mouth daily.     rizatriptan (MAXALT) 10 MG tablet Take one full tab at first sign of migraine headache. May repeat once after 2 hrs. Max 2 tabs per 24 hrs.     Vitamin D, Ergocalciferol, (DRISDOL) 1.25 MG (50000 UT) CAPS capsule Take 50,000 Units by mouth every Sunday.  11   No current facility-administered medications for this visit.    Allergies  Allergen Reactions   Methocarbamol Other (See Comments) and Dermatitis    Made skin red and hot and itchy Skin became red, hot, itchy.     Past Medical History:  Diagnosis Date   Complication of anesthesia    a.) early emergence during neck surgery   Depression    Gallstones    Hypertension    Migraine    Pneumonia    Seizures ([place]) 2010-2013   Brain seizures    Past Surgical History:  Procedure Laterality Date   BACK SURGERY     BREAST LUMPECTOMY WITH RADIO FREQUENCY LOCALIZER Left 07/17/2021   Procedure: BREAST LUMPECTOMY WITH RADIO FREQUENCY LOCALIZER;  Surgeon: Benjamine Sprague,  DO;  Location: [facility] ORS;  Service: General;  Laterality: Left;   CHOLECYSTECTOMY  2002   COLONOSCOPY WITH PROPOFOL N/A 04/10/2017   Procedure: COLONOSCOPY WITH PROPOFOL;  Surgeon: Jonathon Bellows, MD;  Location: [facility] ENDOSCOPY;  Service: Gastroenterology;  Laterality: N/A;   ESOPHAGOGASTRODUODENOSCOPY (EGD) WITH PROPOFOL N/A 04/10/2017   Procedure: ESOPHAGOGASTRODUODENOSCOPY (EGD) WITH PROPOFOL;  Surgeon: Jonathon Bellows, MD;  Location: [facility] ENDOSCOPY;  Service: Gastroenterology;  Laterality: N/A;   NECK SURGERY  2005 & 2009   x2   WISDOM TOOTH EXTRACTION      Social History   Socioeconomic History   Marital status: Married    Spouse name: Not on file   Number of children: 1   Years of education: Not on file   Highest education level: Not on file  Occupational History   Occupation: Civil engineer, contracting  Tobacco Use   Smoking status: Former    Packs/day: 0.50    Years: 3.00    Total pack years: 1.50    Types: Cigarettes    Quit date: 10/24/2014    Years since quitting: 7.3   Smokeless tobacco: Never   Tobacco comments:    pt given handout  Vaping Use   Vaping Use: Never used  Substance and Sexual Activity   Alcohol use: No    Alcohol/week: 0.0 standard drinks of alcohol   Drug use: No   Sexual activity: Yes    Birth control/protection: None  Other Topics  Concern   Not on file  Social History Narrative   Not on file   Social Determinants of Health   Financial Resource Strain: Not on file  Food Insecurity: Not on file  Transportation Needs: Not on file  Physical Activity: Not on file  Stress: Not on file  Social Connections: Not on file  Intimate Partner Violence: Not on file    Family History  Problem Relation Age of Onset   Ulcerative colitis Mother    Ovarian cysts Mother    Kidney disease Father 24   Kidney disease Brother 22   Colon cancer Brother 28   Liver disease Maternal Grandfather    Colon polyps Neg Hx    Esophageal cancer Neg Hx    Kidney cancer Neg Hx     Bladder Cancer Neg Hx    Breast cancer Neg Hx     ROS: no fevers or chills, productive cough, hemoptysis, dysphasia, odynophagia, melena, hematochezia, dysuria, hematuria, rash, seizure activity, orthopnea, PND, pedal edema, claudication. Remaining systems are negative.  Physical Exam:   There were no vitals taken for this visit.  General:  Well developed/well nourished in NAD Skin warm/dry Patient not depressed No peripheral clubbing Back-normal HEENT-normal/normal eyelids Neck supple/normal carotid upstroke bilaterally; no bruits; no JVD; no thyromegaly chest - CTA/ normal expansion CV - RRR/normal S1 and S2; no murmurs, rubs or gallops;  PMI nondisplaced Abdomen -NT/ND, no HSM, no mass, + bowel sounds, no bruit 2+ femoral pulses, no bruits Ext-no edema, chords, 2+ DP Neuro-grossly nonfocal  ECG -normal sinus rhythm, left posterior fascicular block February 17, 2022.  Personally reviewed  A/P  1 palpitations-  2 hypertension-  Kirk Ruths, MD

## 2022-03-23 ENCOUNTER — Encounter: Payer: Self-pay | Admitting: Cardiology

## 2022-03-23 ENCOUNTER — Ambulatory Visit: Payer: BC Managed Care – PPO | Attending: Cardiology | Admitting: Cardiology

## 2022-03-23 VITALS — BP 128/80 | HR 99 | Ht 67.0 in | Wt 181.2 lb

## 2022-03-23 DIAGNOSIS — R072 Precordial pain: Secondary | ICD-10-CM

## 2022-03-23 DIAGNOSIS — I10 Essential (primary) hypertension: Secondary | ICD-10-CM | POA: Diagnosis not present

## 2022-03-23 MED ORDER — METOPROLOL TARTRATE 100 MG PO TABS: ORAL_TABLET | ORAL | 0 refills | Status: DC

## 2022-03-23 NOTE — Patient Instructions (Signed)
Testing/Procedures:  Your physician has requested that you have an echocardiogram. Echocardiography is a painless test that uses sound waves to create images of your heart. It provides your doctor with information about the size and shape of your heart and how well your heart's chambers and valves are working. This procedure takes approximately one hour. There are no restrictions for this procedure. [place]     Your cardiac CT will be scheduled at   [facility] [place], [place] [address] [phone]    If scheduled at [facility], please arrive at the [facility] Entrance (Entrance C2) of [facility] 30 minutes prior to test start time. You can use the FREE valet parking offered at entrance C (encouraged to control the heart rate for the test)  Proceed to the [facility] Radiology Department (first floor) to check-in and test prep.  All radiology patients and guests should use entrance C2 at [facility], accessed from [facility], even though the hospital's physical address listed is [address]      Please follow these instructions carefully (unless otherwise directed):    On the Night Before the Test: Be sure to Drink plenty of water. Do not consume any caffeinated/decaffeinated beverages or chocolate 12 hours prior to your test. Do not take any antihistamines 12 hours prior to your test.   On the Day of the Test: Drink plenty of water until 1 hour prior to the test. Do not eat any food 4 hours prior to the test. You may take your regular medications prior to the test.  Take metoprolol (Lopressor) 100 MG two hours prior to test. FEMALES- please wear underwire-free bra if available, avoid dresses & tight clothing       After the Test: Drink plenty of water. After receiving IV contrast, you may experience a mild flushed feeling. This is normal. On occasion, you may  experience a mild rash up to 24 hours after the test. This is not dangerous. If this occurs, you can take Benadryl 25 mg and increase your fluid intake. If you experience trouble breathing, this can be serious. If it is severe call 911 IMMEDIATELY. If it is mild, please call our office.   We will call to schedule your test 2-4 weeks out understanding that some insurance companies will need an authorization prior to the service being performed.   For non-scheduling related questions, please contact the cardiac imaging nurse navigator should you have any questions/concerns: Marchia Bond, Cardiac Imaging Nurse Navigator Gordy Clement, Cardiac Imaging Nurse Navigator [facility] Direct Office Dial: [phone]   For scheduling needs, including cancellations and rescheduling, please call [place], [phone].    Follow-Up: At [facility], you and your health needs are our priority.  As part of our continuing mission to provide you with exceptional heart care, we have created designated Provider Care Teams.  These Care Teams include your primary Cardiologist (physician) and Advanced Practice Providers (APPs -  Physician Assistants and Nurse Practitioners) who all work together to provide you with the care you need, when you need it.  We recommend signing up for the patient portal called "MyChart".  Sign up information is provided on this After Visit Summary.  MyChart is used to connect with patients for Virtual Visits (Telemedicine).  Patients are able to view lab/test results, encounter notes, upcoming appointments, etc.  Non-urgent messages can be sent to your provider  as well.   To learn more about what you can do with MyChart, go to [email or web address].    Your next appointment:   As needed

## 2022-03-30 ENCOUNTER — Ambulatory Visit (HOSPITAL_COMMUNITY): Payer: BC Managed Care – PPO | Attending: Cardiology

## 2022-03-30 DIAGNOSIS — I082 Rheumatic disorders of both aortic and tricuspid valves: Secondary | ICD-10-CM

## 2022-03-30 DIAGNOSIS — R072 Precordial pain: Secondary | ICD-10-CM | POA: Insufficient documentation

## 2022-03-30 DIAGNOSIS — I5030 Unspecified diastolic (congestive) heart failure: Secondary | ICD-10-CM | POA: Diagnosis not present

## 2022-03-30 LAB — ECHOCARDIOGRAM COMPLETE
Area-P 1/2: 4.57 cm2
S' Lateral: 2.7 cm

## 2022-04-08 ENCOUNTER — Telehealth (HOSPITAL_COMMUNITY): Payer: Self-pay | Admitting: *Deleted

## 2022-04-08 NOTE — Telephone Encounter (Signed)
Attempted to call patient regarding upcoming cardiac CT appointment. °Left message on voicemail with name and callback number ° °Birgit Nowling RN Navigator Cardiac Imaging °[facility] °[phone] Office °[phone] Cell ° °

## 2022-04-09 ENCOUNTER — Ambulatory Visit (HOSPITAL_COMMUNITY)
Admission: RE | Admit: 2022-04-09 | Discharge: 2022-04-09 | Disposition: A | Discharge status: Home / Self Care | Payer: BC Managed Care – PPO | Source: Ambulatory Visit | Attending: Cardiology | Admitting: Cardiology

## 2022-04-09 DIAGNOSIS — R072 Precordial pain: Secondary | ICD-10-CM

## 2022-04-09 MED ORDER — NITROGLYCERIN 0.4 MG SL SUBL
SUBLINGUAL_TABLET | SUBLINGUAL | Status: AC
  Filled 2022-04-09: qty 2

## 2022-04-09 MED ORDER — NITROGLYCERIN 0.4 MG SL SUBL
0.8000 mg | SUBLINGUAL_TABLET | Freq: Once | SUBLINGUAL | Status: AC
  Administered 2022-04-09: 0.8 mg via SUBLINGUAL

## 2022-04-09 MED ORDER — IOHEXOL 350 MG/ML SOLN
100.0000 mL | Freq: Once | INTRAVENOUS | Status: AC | PRN
  Administered 2022-04-09: 100 mL via INTRAVENOUS

## 2022-04-19 DIAGNOSIS — Q2111 Secundum atrial septal defect: Secondary | ICD-10-CM | POA: Insufficient documentation

## 2022-04-27 DIAGNOSIS — R9389 Abnormal findings on diagnostic imaging of other specified body structures: Secondary | ICD-10-CM | POA: Insufficient documentation

## 2022-05-17 ENCOUNTER — Ambulatory Visit (INDEPENDENT_AMBULATORY_CARE_PROVIDER_SITE_OTHER): Payer: BC Managed Care – PPO

## 2022-05-17 ENCOUNTER — Encounter: Payer: Self-pay | Admitting: Obstetrics and Gynecology

## 2022-05-17 VITALS — BP 119/83 | HR 93 | Resp 16 | Wt 188.8 lb

## 2022-05-17 DIAGNOSIS — R350 Frequency of micturition: Secondary | ICD-10-CM | POA: Diagnosis not present

## 2022-05-17 LAB — POCT URINALYSIS DIPSTICK
Bilirubin, UA: NEGATIVE
Glucose, UA: NEGATIVE
Ketones, UA: NEGATIVE
Nitrite, UA: NEGATIVE
Protein, UA: NEGATIVE
Spec Grav, UA: <=1.005 — AB (ref 1.010–1.025)
Urobilinogen, UA: 0.2 E.U./dL
pH, UA: 7.5 (ref 5.0–8.0)

## 2022-05-17 MED ORDER — SULFAMETHOXAZOLE-TRIMETHOPRIM 800-160 MG PO TABS: 1.0000 | ORAL_TABLET | Freq: Two times a day (BID) | ORAL | 0 refills | Status: DC

## 2022-05-17 NOTE — Patient Instructions (Signed)
Urinary Tract Infection, Adult A urinary tract infection (UTI) is an infection of any part of the urinary tract. The urinary tract includes: The kidneys. The ureters. The bladder. The urethra. These organs make, store, and get rid of pee (urine) in the body. What are the causes? This infection is caused by germs (bacteria) in your genital area. These germs grow and cause swelling (inflammation) of your urinary tract. What increases the risk? The following factors may make you more likely to develop this condition: Using a small, thin tube (catheter) to drain pee. Not being able to control when you pee or poop (incontinence). Being female. If you are female, these things can increase the risk: Using these methods to prevent pregnancy: A medicine that kills sperm (spermicide). A device that blocks sperm (diaphragm). Having low levels of a female hormone (estrogen). Being pregnant. You are more likely to develop this condition if: You have genes that add to your risk. You are sexually active. You take antibiotic medicines. You have trouble peeing because of: A prostate that is bigger than normal, if you are female. A blockage in the part of your body that drains pee from the bladder. A kidney stone. A nerve condition that affects your bladder. Not getting enough to drink. Not peeing often enough. You have other conditions, such as: Diabetes. A weak disease-fighting system (immune system). Sickle cell disease. Gout. Injury of the spine. What are the signs or symptoms? Symptoms of this condition include: Needing to pee right away. Peeing small amounts often. Pain or burning when peeing. Blood in the pee. Pee that smells bad or not like normal. Trouble peeing. Pee that is cloudy. Fluid coming from the vagina, if you are female. Pain in the belly or lower back. Other symptoms include: Vomiting. Not feeling hungry. Feeling mixed up (confused). This may be the first symptom in  older adults. Being tired and grouchy (irritable). A fever. Watery poop (diarrhea). How is this treated? Taking antibiotic medicine. Taking other medicines. Drinking enough water. In some cases, you may need to see a specialist. Follow these instructions at home:  Medicines Take over-the-counter and prescription medicines only as told by your doctor. If you were prescribed an antibiotic medicine, take it as told by your doctor. Do not stop taking it even if you start to feel better. General instructions Make sure you: Pee until your bladder is empty. Do not hold pee for a long time. Empty your bladder after sex. Wipe from front to back after peeing or pooping if you are a female. Use each tissue one time when you wipe. Drink enough fluid to keep your pee pale yellow. Keep all follow-up visits. Contact a doctor if: You do not get better after 1-2 days. Your symptoms go away and then come back. Get help right away if: You have very bad back pain. You have very bad pain in your lower belly. You have a fever. You have chills. You feeling like you will vomit or you vomit. Summary A urinary tract infection (UTI) is an infection of any part of the urinary tract. This condition is caused by germs in your genital area. There are many risk factors for a UTI. Treatment includes antibiotic medicines. Drink enough fluid to keep your pee pale yellow. This information is not intended to replace advice given to you by your health care provider. Make sure you discuss any questions you have with your health care provider. Document Revised: 02/15/2020 Document Reviewed: 02/15/2020 Elsevier Patient Education    [place].     Sulfamethoxazole; Trimethoprim Tablets  What is this medication? SULFAMETHOXAZOLE; TRIMETHOPRIM (suhl fuh meth OK suh zohl; trye METH oh prim) treats infections caused by bacteria. It belongs to a group of medications called sulfonamide antibiotics. It will not  treat colds, the flu, or infections caused by viruses. This medicine may be used for other purposes; ask your health care provider or pharmacist if you have questions. COMMON BRAND NAME(S): Bacter-Aid DS, Bactrim, Bactrim DS, Septra, Septra DS What should I tell my care team before I take this medication? They need to know if you have any of these conditions: G6PD deficiency HIV or AIDS Kidney disease Liver disease Low platelet levels Low red blood cell levels Poor nutrition Stomach or intestine problems, such as colitis Thyroid disease An unusual or allergic reaction to sulfamethoxazole, trimethoprim, other medications, foods, dyes, or preservatives Pregnant or trying to get pregnant Breast-feeding How should I use this medication? Take this medication by mouth with a glass of water. Follow the directions on the prescription label. Take your medication at regular intervals. Do not take it more often than directed. Take all of your medication as directed even if you think you are better. Do not skip doses or stop your medication early. Talk to your care team about the use of this medication in children. While this medication may be prescribed for children as young as 2 months for selected conditions, precautions do apply. Overdosage: If you think you have taken too much of this medicine contact a poison control center or emergency room at once. NOTE: This medicine is only for you. Do not share this medicine with others. What if I miss a dose? If you miss a dose, take it as soon as you can. If it is almost time for your next dose, take only that dose. Do not take double or extra doses. What may interact with this medication? Do not take this medication with any of the following: Dofetilide This medication may also interact with the following: Amantadine Certain medications for blood pressure or heart disease Certain medications for depression, such as amitriptyline Certain medications  for diabetes, such as glipizide or glyburide Certain medications that treat or prevent blood clots, such as warfarin Cyclosporine Digoxin Diuretics Estrogen and progestin hormones Indomethacin Methotrexate Phenytoin Procainamide Pyrimethamine Zidovudine This list may not describe all possible interactions. Give your health care provider a list of all the medicines, herbs, non-prescription drugs, or dietary supplements you use. Also tell them if you smoke, drink alcohol, or use illegal drugs. Some items may interact with your medicine. What should I watch for while using this medication? Tell your care team if your symptoms do not start to get better or if they get worse. Do not treat diarrhea with over the counter products. Contact your care team if you have diarrhea that lasts more than 2 days or if it is severe and watery. This medication may cause serious skin reactions. They can happen weeks to months after starting the medication. Contact your care team right away if you notice fevers or flu-like symptoms with a rash. The rash may be red or purple and then turn into blisters or peeling of the skin. Or, you might notice a red rash with swelling of the face, lips or lymph nodes in your neck or under your arms. This medication can make you more sensitive to the sun. Keep out of the sun. If you cannot avoid being in the sun, wear protective clothing  and sunscreen. Do not use sun lamps or tanning beds/booths. Be careful brushing or flossing your teeth or using a toothpick because you may get an infection or bleed more easily. If you have any dental work done, tell your dentist you are receiving this medication. What side effects may I notice from receiving this medication? Side effects that you should report to your care team as soon as possible: Allergic reactions--skin rash, itching, hives, swelling of the face, lips, tongue, or throat Aplastic anemia--unusual weakness or fatigue, dizziness,  headache, trouble breathing, increased bleeding or bruising, fever, chills, cough, or sore throat Dry cough, shortness of breath or trouble breathing High potassium level--muscle weakness, fast or irregular heartbeat Liver injury-- right upper belly pain, loss of appetite, nausea, light-colored stool, dark yellow or brown urine, yellowing skin or eyes, unusual weakness or fatigue Low blood sugar (hypoglycemia)--tremors or shaking, anxiety, sweating, cold or clammy skin, confusion, dizziness, rapid heartbeat Low sodium level--muscle weakness, fatigue, dizziness, headache, confusion Low thyroid levels (hypothyroidism)--unusual weakness or fatigue, increased sensitivity to cold, constipation, hair loss, dry skin, weight gain, feelings of depression Rash, fever, and swollen lymph nodes Redness, blistering, peeling, or loosening of the skin, including inside the mouth Severe diarrhea, fever Small, pus-filled bumps on skin Unusual vaginal discharge, itching, or odor Side effects that usually do not require medical attention (report to your care team if they continue or are bothersome): Loss of appetite Nausea Vomiting This list may not describe all possible side effects. Call your doctor for medical advice about side effects. You may report side effects to FDA at 1-800-FDA-1088. Where should I keep my medication? Keep out of the reach of children. Store between 15 and 25 degrees C (59 to 77 degrees F). Protect from light. Keep the container tightly closed. Throw away any unused medication after the expiration date. NOTE: This sheet is a summary. It may not cover all possible information. If you have questions about this medicine, talk to your doctor, pharmacist, or health care provider.  2023 Elsevier/Gold Standard (2020-10-06 00:00:00)

## 2022-05-17 NOTE — Progress Notes (Signed)
Subjective:    Teresa Rhodes is a 51 y.o. female who complains of frequency, hesitancy, incomplete bladder emptying, nocturnal enuresis, and suprapubic pressure for 1 day.  Patient also complains of back pain, headache, and vaginal discharge. Patient denies congestion, cough, fever, rhinitis, sorethroat, and stomach ache.  Patient does have a history of recurrent UTI.  Patient does not have a history of pyelonephritis. The following portions of the patient's history were reviewed and updated as appropriate: allergies, current medications, past medical history, and problem list. Review of Systems Pertinent items are noted in HPI.    Objective:    BP 119/83   Pulse 93   Resp 16   Wt 188 lb 12.8 oz (85.6 kg)   BMI 29.57 kg/m  General: alert and mild distress            Laboratory:  Urine dipstick shows 3+ for leukocyte esterase.      Assessment:    Acute cystitis    Plan: Plan:    1. Medications: TMP/SMX 2. Maintain adequate hydration 3. Follow up if symptoms not improving, and prn.

## 2022-05-21 LAB — URINE CULTURE

## 2022-05-23 ENCOUNTER — Encounter: Payer: Self-pay | Admitting: Certified Nurse Midwife

## 2022-05-27 ENCOUNTER — Other Ambulatory Visit: Payer: Self-pay | Admitting: Internal Medicine

## 2022-05-27 DIAGNOSIS — Z1231 Encounter for screening mammogram for malignant neoplasm of breast: Secondary | ICD-10-CM

## 2022-06-01 NOTE — Progress Notes (Deleted)
HPI: FU CP. Echocardiogram September 2023 showed normal LV function, grade 1 diastolic dysfunction. Cardiac CTA September 2023 showed calcium score 0 and minimal plaque in the LAD, proximal right coronary artery and second diagonal; secundum ASD noted as well.  Patient subsequently seen at [facility] and had a cardiac MRI which showed normal LV function, patent foramen ovale with Qp/Qs 1.02.  Transesophageal echocardiogram October 2023 at [facility] showed small secundum ASD, normal LV and RV function.  Brain MRI November 2023 showed no acute infarct.  She was seen by Dr. Jenne Pane and conservative measures/follow-up recommended.  Since last seen  Current Outpatient Medications  Medication Sig Dispense Refill   amphetamine-dextroamphetamine (ADDERALL) 30 MG tablet Take 60 mg by mouth daily.     benazepril-hydrochlorthiazide (LOTENSIN HCT) 20-12.5 MG tablet Take 1 tablet by mouth 2 (two) times a day.     BLACK PEPPER-TURMERIC PO Take 1 drop by mouth in the morning, at noon, and at bedtime.     metoprolol tartrate (LOPRESSOR) 100 MG tablet TAKE 2 HOURS PRIOR TO CT SCAN 1 tablet 0   Multiple Vitamins-Minerals (MULTIVITAMIN WITH MINERALS) tablet Take 1 tablet by mouth daily.     rizatriptan (MAXALT) 10 MG tablet Take one full tab at first sign of migraine headache. May repeat once after 2 hrs. Max 2 tabs per 24 hrs.     sulfamethoxazole-trimethoprim (BACTRIM DS) 800-160 MG tablet Take 1 tablet by mouth 2 (two) times daily. 14 tablet 0   Vitamin D, Ergocalciferol, (DRISDOL) 1.25 MG (50000 UT) CAPS capsule Take 50,000 Units by mouth every Sunday.  11   No current facility-administered medications for this visit.     Past Medical History:  Diagnosis Date   Complication of anesthesia    a.) early emergence during neck surgery   Depression    Gallstones    Hypertension    Migraine    Pneumonia    Seizures ([place]) 2010-2013   Brain seizures    Past Surgical History:   Procedure Laterality Date   BACK SURGERY     BREAST LUMPECTOMY WITH RADIO FREQUENCY LOCALIZER Left 07/17/2021   Procedure: BREAST LUMPECTOMY WITH RADIO FREQUENCY LOCALIZER;  Surgeon: Benjamine Sprague, DO;  Location: [facility] ORS;  Service: General;  Laterality: Left;   CHOLECYSTECTOMY  2002   COLONOSCOPY WITH PROPOFOL N/A 04/10/2017   Procedure: COLONOSCOPY WITH PROPOFOL;  Surgeon: Jonathon Bellows, MD;  Location[facility] ENDOSCOPY;  Service: Gastroenterology;  Laterality: N/A;   ESOPHAGOGASTRODUODENOSCOPY (EGD) WITH PROPOFOL N/A 04/10/2017   Procedure: ESOPHAGOGASTRODUODENOSCOPY (EGD) WITH PROPOFOL;  Surgeon: Jonathon Bellows, MD;  Location: [facility] ENDOSCOPY;  Service: Gastroenterology;  Laterality: N/A;   NECK SURGERY  2005 & 2009   x2   WISDOM TOOTH EXTRACTION      Social History   Socioeconomic History   Marital status: Married    Spouse name: Not on file   Number of children: 3   Years of education: Not on file   Highest education level: Not on file  Occupational History   Occupation: Civil engineer, contracting  Tobacco Use   Smoking status: Former    Packs/day: 0.50    Years: 3.00    Total pack years: 1.50    Types: Cigarettes    Quit date: 10/24/2014    Years since quitting: 7.6   Smokeless tobacco: Never   Tobacco comments:    pt given handout  Vaping Use   Vaping Use: Never used  Substance and Sexual Activity   Alcohol use:  Yes    Comment: Occasional   Drug use: No   Sexual activity: Yes    Birth control/protection: None  Other Topics Concern   Not on file  Social History Narrative   Not on file   Social Determinants of Health   Financial Resource Strain: Not on file  Food Insecurity: Not on file  Transportation Needs: Not on file  Physical Activity: Not on file  Stress: Not on file  Social Connections: Not on file  Intimate Partner Violence: Not on file    Family History  Problem Relation Age of Onset   Ulcerative colitis Mother    Ovarian cysts Mother    Kidney disease Father  80   Kidney disease Brother 93   Colon cancer Brother 55   Liver disease Maternal Grandfather    Colon polyps Neg Hx    Esophageal cancer Neg Hx    Kidney cancer Neg Hx    Bladder Cancer Neg Hx    Breast cancer Neg Hx     ROS: no fevers or chills, productive cough, hemoptysis, dysphasia, odynophagia, melena, hematochezia, dysuria, hematuria, rash, seizure activity, orthopnea, PND, pedal edema, claudication. Remaining systems are negative.  Physical Exam: Well-developed well-nourished in no acute distress.  Skin is warm and dry.  HEENT is normal.  Neck is supple.  Chest is clear to auscultation with normal expansion.  Cardiovascular exam is regular rate and rhythm.  Abdominal exam nontender or distended. No masses palpated. Extremities show no edema. neuro grossly intact  ECG- personally reviewed  A/P  1 small secundum ASD-right side not enlarged on recent echocardiogram.  Plan will be to follow-up echoes in the future.  2 history of chest pain-minimal disease noted on previous CTA.  3 hypertension-blood pressure controlled.  Continue present medications.  Kirk Ruths, MD

## 2022-06-15 ENCOUNTER — Ambulatory Visit: Payer: BC Managed Care – PPO | Admitting: Cardiology

## 2022-07-19 HISTORY — PX: COLONOSCOPY: SHX174

## 2022-09-30 ENCOUNTER — Other Ambulatory Visit: Payer: Self-pay | Admitting: Internal Medicine

## 2022-09-30 DIAGNOSIS — Z1231 Encounter for screening mammogram for malignant neoplasm of breast: Secondary | ICD-10-CM

## 2022-11-18 ENCOUNTER — Ambulatory Visit
Admission: RE | Admit: 2022-11-18 | Discharge: 2022-11-18 | Disposition: A | Discharge status: Home / Self Care | Payer: BC Managed Care – PPO | Source: Ambulatory Visit | Attending: Internal Medicine | Admitting: Internal Medicine

## 2022-11-18 DIAGNOSIS — Z1231 Encounter for screening mammogram for malignant neoplasm of breast: Secondary | ICD-10-CM | POA: Diagnosis present

## 2022-12-09 ENCOUNTER — Ambulatory Visit
Admission: RE | Admit: 2022-12-09 | Discharge: 2022-12-09 | Disposition: A | Discharge status: Home / Self Care | Payer: BC Managed Care – PPO | Source: Ambulatory Visit | Attending: Neurosurgery | Admitting: Neurosurgery

## 2022-12-09 ENCOUNTER — Ambulatory Visit
Admission: RE | Admit: 2022-12-09 | Discharge: 2022-12-09 | Disposition: A | Discharge status: Home / Self Care | Payer: BC Managed Care – PPO | Attending: Neurosurgery | Admitting: Neurosurgery

## 2022-12-09 ENCOUNTER — Other Ambulatory Visit: Payer: Self-pay | Admitting: Neurosurgery

## 2022-12-09 DIAGNOSIS — M5010 Cervical disc disorder with radiculopathy, unspecified cervical region: Secondary | ICD-10-CM

## 2023-01-19 ENCOUNTER — Other Ambulatory Visit: Payer: Self-pay | Admitting: Internal Medicine

## 2023-01-19 DIAGNOSIS — R911 Solitary pulmonary nodule: Secondary | ICD-10-CM

## 2023-04-27 ENCOUNTER — Ambulatory Visit
Admission: RE | Admit: 2023-04-27 | Discharge: 2023-04-27 | Disposition: A | Discharge status: Home / Self Care | Payer: BC Managed Care – PPO | Source: Ambulatory Visit | Attending: Internal Medicine | Admitting: Internal Medicine

## 2023-04-27 DIAGNOSIS — R911 Solitary pulmonary nodule: Secondary | ICD-10-CM

## 2023-06-14 ENCOUNTER — Other Ambulatory Visit
Admission: RE | Admit: 2023-06-14 | Discharge: 2023-06-14 | Disposition: A | Discharge status: Home / Self Care | Payer: BC Managed Care – PPO | Source: Ambulatory Visit | Attending: Physician Assistant | Admitting: Physician Assistant

## 2023-06-14 DIAGNOSIS — R0602 Shortness of breath: Secondary | ICD-10-CM | POA: Insufficient documentation

## 2023-06-14 LAB — D-DIMER, QUANTITATIVE: D-Dimer, Quant: <0.27 ug{FEU}/mL (ref 0.00–0.50)

## 2023-09-23 ENCOUNTER — Other Ambulatory Visit: Payer: Self-pay | Admitting: Internal Medicine

## 2023-09-23 DIAGNOSIS — R1010 Upper abdominal pain, unspecified: Secondary | ICD-10-CM

## 2023-09-23 DIAGNOSIS — I10 Essential (primary) hypertension: Secondary | ICD-10-CM

## 2023-09-26 ENCOUNTER — Other Ambulatory Visit: Payer: Self-pay | Admitting: Internal Medicine

## 2023-09-26 DIAGNOSIS — R0781 Pleurodynia: Secondary | ICD-10-CM

## 2023-09-29 ENCOUNTER — Ambulatory Visit
Admission: RE | Admit: 2023-09-29 | Discharge: 2023-09-29 | Disposition: A | Discharge status: Home / Self Care | Source: Ambulatory Visit | Attending: Internal Medicine | Admitting: Internal Medicine

## 2023-09-29 ENCOUNTER — Ambulatory Visit
Admission: RE | Admit: 2023-09-29 | Discharge: 2023-09-29 | Discharge status: Home / Self Care | Source: Ambulatory Visit | Attending: Internal Medicine | Admitting: Internal Medicine

## 2023-09-29 DIAGNOSIS — R1010 Upper abdominal pain, unspecified: Secondary | ICD-10-CM

## 2023-09-29 DIAGNOSIS — R0781 Pleurodynia: Secondary | ICD-10-CM

## 2023-09-29 DIAGNOSIS — I10 Essential (primary) hypertension: Secondary | ICD-10-CM

## 2023-09-29 MED ORDER — IOPAMIDOL (ISOVUE-370) INJECTION 76%
75.0000 mL | Freq: Once | INTRAVENOUS | Status: AC | PRN
  Administered 2023-09-29: 75 mL via INTRAVENOUS

## 2023-11-01 NOTE — Progress Notes (Unsigned)
 PCP:  Marguarite Arbour, MD   No chief complaint on file.    HPI:      Ms. Teresa Rhodes is a 53 y.o. G2P1011 who LMP was No LMP recorded. Patient is perimenopausal., presents today for her annual examination.  Her menses are absent now with endometrial ablation, used to be occas, light spotting for a few days. Dysmenorrhea none. She does not have intermenstrual bleeding.   Sex activity: single partner, husband; contraception - vasectomy. No vag dryness. Having sharp, stabbing pains now during sex, for the past yr. Not every time but in certain positions. Sometimes feels like "insides are falling out" after sex for 10-20 min. No bleeding with sex.  Last Pap: June 21, 2018  Results were: no abnormalities /neg HPV DNA   Last mammogram: 11/18/22 Results were normal, repeat in 12 months. 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 There is no FH of breast cancer. There is no FH of ovarian cancer. The patient does do self-breast exams.   Tobacco use: The patient denies current or previous tobacco use. Alcohol use: none No drug use.  Exercise: moderately active  Colonoscopy: 2018 with hemorrhoids with Dr. Tobi Bastos; s/p EGD with bx. Unsure when colonoscopy due again due to FH colon cancer in her brother.  She does get adequate calcium and Vitamin D in her diet. Labs with PCP.   Patient Active Problem List   Diagnosis Date Noted   Seizure-like activity (HCC) 03/12/2019   Tingling 01/15/2019   Tingling in extremities 11/11/2018   Climacteric 06/21/2018   Chronic neck pain 05/25/2018   Chronic upper extremity pain 05/25/2018   Chronic bilateral low back pain with bilateral sciatica 05/25/2018   Chronic pain of both lower extremities 05/25/2018   Right foot drop 05/25/2018   Nausea & vomiting 04/07/2017   Seizure (HCC) 12/01/2016   Deep vein thrombosis (DVT) of left lower extremity (HCC) 11/23/2016   Leg swelling 11/23/2016   Pinched  nerve 11/23/2016   Right leg pain 11/23/2016   Difficulty sleeping 10/20/2016   Headache disorder 10/20/2016   Chronic idiopathic constipation 12/04/2015   Degeneration of intervertebral disc of lumbosacral region 09/12/2015   Headache, migraine 09/12/2015   Current tobacco use 09/12/2015   Back pain 09/11/2015   Lumbosacral radiculopathy 07/22/2014   White matter abnormality on MRI of brain 06/12/2014   Foot drop 06/12/2014   Right arm weakness 06/12/2014    Past Surgical History:  Procedure Laterality Date   BACK SURGERY     BREAST BIOPSY Left 06/16/2021   papilloma-surgical excised on 07/17/2021   BREAST LUMPECTOMY WITH RADIO FREQUENCY LOCALIZER Left 07/17/2021   Procedure: BREAST LUMPECTOMY WITH RADIO FREQUENCY LOCALIZER;  Surgeon: Sung Amabile, DO;  Location: [facility] ORS;  Service: General;  Laterality: Left;   CHOLECYSTECTOMY  2002   COLONOSCOPY WITH PROPOFOL N/A 04/10/2017   Procedure: COLONOSCOPY WITH PROPOFOL;  Surgeon: Wyline Mood, MD;  Location: [facility] ENDOSCOPY;  Service: Gastroenterology;  Laterality: N/A;   ESOPHAGOGASTRODUODENOSCOPY (EGD) WITH PROPOFOL N/A 04/10/2017   Procedure: ESOPHAGOGASTRODUODENOSCOPY (EGD) WITH PROPOFOL;  Surgeon: Wyline Mood, MD;  Location: [facility] ENDOSCOPY;  Service: Gastroenterology;  Laterality: N/A;   NECK SURGERY  2005 & 2009   x2   WISDOM TOOTH EXTRACTION      Family History  Problem Relation Age of Onset   Ulcerative colitis Mother    Ovarian cysts Mother    Kidney disease Father 51   Kidney disease Brother 81  Colon cancer Brother 51   Liver disease Maternal Grandfather    Colon polyps Neg Hx    Esophageal cancer Neg Hx    Kidney cancer Neg Hx    Bladder Cancer Neg Hx    Breast cancer Neg Hx     Social History   Socioeconomic History   Marital status: Married    Spouse name: Not on file   Number of children: 3   Years of education: Not on file   Highest education level: Not on file  Occupational History   Occupation:  Immunologist  Tobacco Use   Smoking status: Former    Current packs/day: 0.00    Average packs/day: 0.5 packs/day for 3.0 years (1.5 ttl pk-yrs)    Types: Cigarettes    Start date: 10/24/2011    Quit date: 10/24/2014    Years since quitting: 9.0   Smokeless tobacco: Never   Tobacco comments:    pt given handout  Vaping Use   Vaping status: Never Used  Substance and Sexual Activity   Alcohol use: Yes    Comment: Occasional   Drug use: No   Sexual activity: Yes    Birth control/protection: None  Other Topics Concern   Not on file  Social History Narrative   Not on file   Social Drivers of Health   Financial Resource Strain: Low Risk  (07/22/2023)   Received from [facility]   Overall Financial Resource Strain (CARDIA)    Difficulty of Paying Living Expenses: Not hard at all  Food Insecurity: No Food Insecurity (07/22/2023)   Received from [facility]   Hunger Vital Sign    Worried About Running Out of Food in the Last Year: Never true    Ran Out of Food in the Last Year: Never true  Transportation Needs: No Transportation Needs (07/22/2023)   Received from [facility] - Transportation    In the past 12 months, has lack of transportation kept you from medical appointments or from getting medications?: No    Lack of Transportation (Non-Medical): No  Physical Activity: Not on file  Stress: Not on file  Social Connections: Not on file  Intimate Partner Violence: Not on file     Current Outpatient Medications:    amphetamine-dextroamphetamine (ADDERALL) 30 MG tablet, Take 60 mg by mouth daily., Disp: , Rfl:    benazepril-hydrochlorthiazide (LOTENSIN HCT) 20-12.5 MG tablet, Take 1 tablet by mouth 2 (two) times a day., Disp: , Rfl:    BLACK PEPPER-TURMERIC PO, Take 1 drop by mouth in the morning, at noon, and at bedtime., Disp: , Rfl:    metoprolol tartrate (LOPRESSOR) 100 MG tablet, TAKE 2 HOURS PRIOR TO CT SCAN,  Disp: 1 tablet, Rfl: 0   Multiple Vitamins-Minerals (MULTIVITAMIN WITH MINERALS) tablet, Take 1 tablet by mouth daily., Disp: , Rfl:    rizatriptan (MAXALT) 10 MG tablet, Take one full tab at first sign of migraine headache. May repeat once after 2 hrs. Max 2 tabs per 24 hrs., Disp: , Rfl:    sulfamethoxazole-trimethoprim (BACTRIM DS) 800-160 MG tablet, Take 1 tablet by mouth 2 (two) times daily., Disp: 14 tablet, Rfl: 0   Vitamin D, Ergocalciferol, (DRISDOL) 1.25 MG (50000 UT) CAPS capsule, Take 50,000 Units by mouth every Sunday., Disp: , Rfl: 11     ROS:  Review of Systems  Constitutional:  Negative for fatigue, fever and unexpected weight change.  Respiratory:  Negative for cough, shortness  of breath and wheezing.   Cardiovascular:  Negative for chest pain, palpitations and leg swelling.  Gastrointestinal:  Positive for constipation. Negative for blood in stool, diarrhea, nausea and vomiting.  Endocrine: Negative for cold intolerance, heat intolerance and polyuria.  Genitourinary:  Negative for dyspareunia, dysuria, flank pain, frequency, genital sores, hematuria, menstrual problem, pelvic pain, urgency, vaginal bleeding, vaginal discharge and vaginal pain.  Musculoskeletal:  Positive for arthralgias and joint swelling. Negative for back pain and myalgias.  Skin:  Negative for rash.  Neurological:  Positive for headaches. Negative for dizziness, syncope, light-headedness and numbness.  Hematological:  Negative for adenopathy.  Psychiatric/Behavioral:  Negative for agitation, confusion, sleep disturbance and suicidal ideas. The patient is not nervous/anxious.   BREAST: tenderness   Objective: There were no vitals taken for this visit.   Physical Exam Constitutional:      Appearance: She is well-developed.  Genitourinary:     Vulva normal.     Genitourinary Comments: CERVIX TENDER TO TOUCH, CAUSING PELVIC PAIN; NOT CMT PER SE     Right Labia: No rash, tenderness or lesions.     Left Labia: No tenderness, lesions or rash.    No vaginal discharge, erythema or tenderness.      Right Adnexa: not tender and no mass present.    Left Adnexa: not tender and no mass present.    No cervical friability or polyp.     Uterus is tender.     Uterus is not enlarged.  Breasts:    Right: Tenderness present. No mass, nipple discharge or skin change.     Left: Tenderness present. No mass, nipple discharge or skin change.  Neck:     Thyroid: No thyromegaly.  Cardiovascular:     Rate and Rhythm: Normal rate and regular rhythm.     Heart sounds: Normal heart sounds. No murmur heard. Pulmonary:     Effort: Pulmonary effort is normal.     Breath sounds: Normal breath sounds.  Abdominal:     Palpations: Abdomen is soft.     Tenderness: There is abdominal tenderness in the right lower quadrant, suprapubic area and left lower quadrant. There is no guarding or rebound.  Musculoskeletal:        General: Normal range of motion.     Cervical back: Normal range of motion.  Lymphadenopathy:     Cervical: No cervical adenopathy.  Neurological:     General: No focal deficit present.     Mental Status: She is alert and oriented to person, place, and time.     Cranial Nerves: No cranial nerve deficit.  Skin:    General: Skin is warm and dry.  Psychiatric:        Mood and Affect: Mood normal.        Behavior: Behavior normal.        Thought Content: Thought content normal.        Judgment: Judgment normal.  Vitals reviewed.     Assessment/Plan: Encounter for annual routine gynecological examination  Encounter for screening mammogram for malignant neoplasm of breast; pt current on mammo; s/p breast mass exc.   Screening for colon cancer--pt to f/u with [facility] as to when due again.   Dyspareunia in female - Plan: US PELVIS TRANSVAGINAL NON-OB (TV ONLY); tender on exam, check GYN u/s. Will f/u with results. If neg, may treat for PID given tenderness on exam.             GYN  counsel breast self  exam, mammography screening, adequate intake of calcium and vitamin D, diet and exercise     F/U  No follow-ups on file.  Aloha Bartok B. Analiya Porco, PA-C 11/01/2023 12:53 PM

## 2023-11-03 ENCOUNTER — Other Ambulatory Visit (HOSPITAL_COMMUNITY)
Admission: RE | Admit: 2023-11-03 | Discharge: 2023-11-03 | Disposition: A | Discharge status: Home / Self Care | Source: Ambulatory Visit | Attending: Obstetrics and Gynecology | Admitting: Obstetrics and Gynecology

## 2023-11-03 ENCOUNTER — Encounter: Payer: Self-pay | Admitting: Obstetrics and Gynecology

## 2023-11-03 ENCOUNTER — Ambulatory Visit (INDEPENDENT_AMBULATORY_CARE_PROVIDER_SITE_OTHER): Admitting: Obstetrics and Gynecology

## 2023-11-03 VITALS — BP 108/75 | HR 102 | Ht 69.0 in | Wt 208.0 lb

## 2023-11-03 DIAGNOSIS — N949 Unspecified condition associated with female genital organs and menstrual cycle: Secondary | ICD-10-CM

## 2023-11-03 DIAGNOSIS — Z124 Encounter for screening for malignant neoplasm of cervix: Secondary | ICD-10-CM | POA: Insufficient documentation

## 2023-11-03 DIAGNOSIS — Z1151 Encounter for screening for human papillomavirus (HPV): Secondary | ICD-10-CM | POA: Insufficient documentation

## 2023-11-03 DIAGNOSIS — Z1231 Encounter for screening mammogram for malignant neoplasm of breast: Secondary | ICD-10-CM

## 2023-11-03 DIAGNOSIS — L900 Lichen sclerosus et atrophicus: Secondary | ICD-10-CM

## 2023-11-03 DIAGNOSIS — Z1211 Encounter for screening for malignant neoplasm of colon: Secondary | ICD-10-CM

## 2023-11-03 DIAGNOSIS — D071 Carcinoma in situ of vulva: Secondary | ICD-10-CM | POA: Diagnosis not present

## 2023-11-03 DIAGNOSIS — Z01419 Encounter for gynecological examination (general) (routine) without abnormal findings: Secondary | ICD-10-CM | POA: Diagnosis not present

## 2023-11-03 DIAGNOSIS — R102 Pelvic and perineal pain unspecified side: Secondary | ICD-10-CM

## 2023-11-03 DIAGNOSIS — Z01411 Encounter for gynecological examination (general) (routine) with abnormal findings: Secondary | ICD-10-CM

## 2023-11-03 DIAGNOSIS — C4499 Other specified malignant neoplasm of skin, unspecified: Secondary | ICD-10-CM

## 2023-11-03 DIAGNOSIS — N941 Unspecified dyspareunia: Secondary | ICD-10-CM

## 2023-11-03 DIAGNOSIS — K5904 Chronic idiopathic constipation: Secondary | ICD-10-CM

## 2023-11-03 NOTE — Patient Instructions (Signed)
 I value your feedback and you entrusting [place] with your care. If you get a [facility] patient survey, I would appreciate you taking the time to let us know about your experience today. Thank you!  [facility] ([place])--[phone]

## 2023-11-10 ENCOUNTER — Encounter: Payer: Self-pay | Admitting: Obstetrics and Gynecology

## 2023-11-10 DIAGNOSIS — C439 Malignant melanoma of skin, unspecified: Secondary | ICD-10-CM | POA: Insufficient documentation

## 2023-11-10 LAB — SURGICAL PATHOLOGY

## 2023-11-10 NOTE — Addendum Note (Signed)
 Addended by: Alyn Judge B on: 11/10/2023 12:32 PM   Modules accepted: Orders

## 2023-11-11 ENCOUNTER — Encounter: Payer: Self-pay | Admitting: Obstetrics and Gynecology

## 2023-11-11 LAB — CYTOLOGY - PAP
Comment: NEGATIVE
Diagnosis: NEGATIVE
High risk HPV: NEGATIVE

## 2023-11-21 ENCOUNTER — Ambulatory Visit
Admission: RE | Admit: 2023-11-21 | Discharge: 2023-11-21 | Disposition: A | Discharge status: Home / Self Care | Source: Ambulatory Visit | Attending: Obstetrics and Gynecology | Admitting: Obstetrics and Gynecology

## 2023-11-21 DIAGNOSIS — Z1231 Encounter for screening mammogram for malignant neoplasm of breast: Secondary | ICD-10-CM

## 2023-11-23 ENCOUNTER — Encounter: Payer: Self-pay | Admitting: Obstetrics and Gynecology

## 2023-11-23 ENCOUNTER — Inpatient Hospital Stay: Attending: Obstetrics and Gynecology | Admitting: Obstetrics and Gynecology

## 2023-11-23 ENCOUNTER — Inpatient Hospital Stay

## 2023-11-23 VITALS — BP 135/85 | HR 92 | Temp 97.8°F | Resp 19 | Wt 209.8 lb

## 2023-11-23 DIAGNOSIS — Z85820 Personal history of malignant melanoma of skin: Secondary | ICD-10-CM | POA: Insufficient documentation

## 2023-11-23 DIAGNOSIS — C519 Malignant neoplasm of vulva, unspecified: Secondary | ICD-10-CM | POA: Diagnosis not present

## 2023-11-23 DIAGNOSIS — C4499 Other specified malignant neoplasm of skin, unspecified: Secondary | ICD-10-CM | POA: Insufficient documentation

## 2023-11-23 DIAGNOSIS — Z800 Family history of malignant neoplasm of digestive organs: Secondary | ICD-10-CM | POA: Insufficient documentation

## 2023-11-23 DIAGNOSIS — Z79899 Other long term (current) drug therapy: Secondary | ICD-10-CM | POA: Insufficient documentation

## 2023-11-23 NOTE — Progress Notes (Signed)
 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 G2P1 female who is seen in consultation from Dr. Geralyn Knee for Vulvar Paget's disease  Seen by Gyn APP 11/03/23.  See notes below.  Patient states she has had itching for some time.  Uses astringent about once a month.  Saw derm who noticed white area R labia majora and started on clobetasol oint about a month ago. Pt using daily. Derm was leaving practice so needs f/u with Gyn.  Colposcopy and biopsy 11/03/23 by Gyn PA.      Biopsy of right labia showed extramammary Paget's disease. Immunohistochemical stains show that the lesional cells within the squamous mucosa are positive for CK7 while they are negative for HMB45, SOX10 immunostains and mucicarmine special stain. This immunoprofile is consistent with above interpretation.   [id number] who LMP was No LMP recorded. Patient is perimenopausal., presents today for her annual examination, last annual 1/23.  Her menses are absent for a few yrs with endometrial ablation, used to be occas, light spotting for a few days. Dysmenorrhea none. She does not have intermenstrual bleeding.    Sex activity: single partner, Teresa Rhodes; contraception - vasectomy. No vag dryness. C/o "sharp, stabbing pains now during sex, for the past yr. Not every time but in certain positions. Sometimes feels like "insides are falling out" after sex for 10-20 min" at 1/23 annual with neg GYN u/s for pain 2/23. No bleeding with sex. Still having same sx. Also with diffuse pelvic/abd pain in general with nausea/no appetite. Hx of chronic constipation but better recently. Had neg colonoscopy 9/24.   09/29/23 CT ABD/PELVIS WITH PCP: IMPRESSION: *No acute abnormality of the abdomen or pelvis. *Abundant residual fecal material right side colon and proximal transverse colon without obstruction or impaction. *Right  adnexal cyst 3.2 x 2.3 cm. Recommend follow-up pelvic ultrasound in 6-12 months   Last Pap: June 21, 2018  Results were: no abnormalities /neg HPV DNA    Last mammogram: 11/18/22 Results were normal, repeat in 12 months. 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 There is no FH of breast cancer. There is no FH of ovarian cancer. The patient does do self-breast exams.    Tobacco use: The patient denies current or previous tobacco use. Alcohol use: none No drug use.  Exercise: moderately active   Colonoscopy: 9/24  with Dr. Elsa Halls, neg per pt, unsure when due again per pt; 2018 with hemorrhoids and neg bx with Dr. Antony Baumgartner; s/p EGD with bx. Unsure when colonoscopy due again due to possible FH colon cancer in her brother.   She does get adequate calcium and Vitamin D in her diet. Pt getting 4-6 hrs sleep nightly, very exhausted. Could sleep longer but Teresa Rhodes wakes her up.   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 Skin, left medial lower leg, shave - Malignant melanoma Type: Superficial spreading , nevoid Clark Level: IV Breslow thickness: 0.9 mm Growth phase: Vertical, nonpigmented epithelioid cells Mitoses: 0 per square millimeter Nuclear grade: 2/3 Tumor infiltrating lymphocytes: Non-brisk Regression:  Not identified Ulceration:  None Microscopic satellite: Not identified Vascular invasion:  Not identified Perineural invasion: Not identified Associated nevus: None Margins: Focally present in base of biopsy Pathologic (pT, AJCC 8th edition) staging: pT1b  Re-excision 5/24 with no residual and negative left groin node.   Patient says she has pain all over body,  longstanding  Problem List: Patient Active Problem List   Diagnosis Date Noted   Extramammary Paget's disease 11/23/2023   Melanoma (HCC) 11/10/2023   Adnexal cyst 11/03/2023   Seizure-like activity (HCC) 03/12/2019   Tingling 01/15/2019   Tingling in extremities  11/11/2018   Climacteric 06/21/2018   Chronic neck pain 05/25/2018   Chronic upper extremity pain 05/25/2018   Chronic bilateral low back pain with bilateral sciatica 05/25/2018   Chronic pain of both lower extremities 05/25/2018   Right foot drop 05/25/2018   Nausea & vomiting 04/07/2017   Seizure (HCC) 12/01/2016   Deep vein thrombosis (DVT) of left lower extremity (HCC) 11/23/2016   Leg swelling 11/23/2016   Pinched nerve 11/23/2016   Right leg pain 11/23/2016   Difficulty sleeping 10/20/2016   Headache disorder 10/20/2016   Chronic idiopathic constipation 12/04/2015   Degeneration of intervertebral disc of lumbosacral region 09/12/2015   Headache, migraine 09/12/2015   Current tobacco use 09/12/2015   Back pain 09/11/2015   Lumbosacral radiculopathy 07/22/2014   White matter abnormality on MRI of brain 06/12/2014   Foot drop 06/12/2014   Right arm weakness 06/12/2014    Past Medical History: Past Medical History:  Diagnosis Date   Complication of anesthesia    a.) early emergence during neck surgery   Depression    Gallstones    Hypertension    Migraine    Pneumonia    Seizures (HCC) 2010-2013   Brain seizures    Past Surgical History: Past Surgical History:  Procedure Laterality Date   BACK SURGERY     BREAST BIOPSY Left 06/16/2021   papilloma-surgical excised on 07/17/2021   BREAST LUMPECTOMY WITH RADIO FREQUENCY LOCALIZER Left 07/17/2021   Procedure: BREAST LUMPECTOMY WITH RADIO FREQUENCY LOCALIZER;  Surgeon: Conrado Delay, DO;  Location: [facility] ORS;  Service: General;  Laterality: Left;   CHOLECYSTECTOMY  2002   COLONOSCOPY WITH PROPOFOL  N/A 04/10/2017   Procedure: COLONOSCOPY WITH PROPOFOL ;  Surgeon: Luke Salaam, MD;  Location: [facility] END[facility]COPY;  Service: Gastroenterology;  Laterality: N/A;   ESOPHAGOGASTRODUODENOSCOPY (EGD) WITH PROPOFOL  N/A 04/10/2017   Procedure: ESOPHAGOGASTRODUODENOSCOPY (EGD) WITH PROPOFOL ;  Surgeon: Luke Salaam, MD;  Location: [facility]  ENDOSCOPY;  Service: Gastroenterology;  Laterality: N/A;   NECK SURGERY  2005 & 2009   x2   OTHER SURGICAL HISTORY     Melanoma left leg 12/17/2022   WISDOM TOOTH EXTRACTION        OB History:  OB History  Gravida Para Term Preterm AB Living  2 1 1  1 1   SAB IAB Ectopic Multiple Live Births  1    1    # Outcome Date GA Lbr Len/2nd Weight Sex Type Anes PTL Lv  2 SAB           1 Term             Family History: Family History  Problem Relation Age of Onset   Ulcerative colitis Mother    Ovarian cysts Mother    Kidney disease Father 64   Kidney disease Brother 36   Colon cancer Brother 64   Liver disease Maternal Grandfather    Colon polyps Neg Hx    Esophageal cancer Neg Hx    Kidney cancer Neg Hx    Bladder Cancer Neg Hx    Breast cancer Neg Hx     Social History: Social History   Socioeconomic History   Marital status: Married    Spouse name: Not on file   Number of  children: 3   Years of education: Not on file   Highest education level: Not on file  Occupational History   Occupation: Immunologist  Tobacco Use   Smoking status: Former    Current packs/day: 0.00    Average packs/day: 0.5 packs/day for 3.0 years (1.5 ttl pk-yrs)    Types: Cigarettes    Start date: 10/24/2011    Quit date: 10/24/2014    Years since quitting: 9.0   Smokeless tobacco: Never   Tobacco comments:    pt given handout  Vaping Use   Vaping status: Never Used  Substance and Sexual Activity   Alcohol use: Yes    Comment: Occasional   Drug use: No   Sexual activity: Yes    Birth control/protection: Post-menopausal  Other Topics Concern   Not on file  Social History Narrative   Not on file   Social Drivers of Health   Financial Resource Strain: Low Risk  (07/22/2023)   Received from [facility]   Overall Financial Resource Strain (CARDIA)    Difficulty of Paying Living Expenses: Not hard at all  Food Insecurity: No Food Insecurity (07/22/2023)   Received  from [facility]   Hunger Vital Sign    Worried About Running Out of Food in the Last Year: Never true    Ran Out of Food in the Last Year: Never true  Transportation Needs: No Transportation Needs (07/22/2023)   Received from [facility] - Transportation    In the past 12 months, has lack of transportation kept you from medical appointments or from getting medications?: No    Lack of Transportation (Non-Medical): No  Physical Activity: Not on file  Stress: Not on file  Social Connections: Not on file  Intimate Partner Violence: Not on file    Allergies: Allergies  Allergen Reactions   Methocarbamol Other (See Comments) and Dermatitis    Made skin red and hot and itchy Skin became red, hot, itchy.    Current Medications: Current Outpatient Medications  Medication Sig Dispense Refill   aspirin EC 81 MG tablet Take 1 tablet by mouth daily.     benazepril-hydrochlorthiazide (LOTENSIN HCT) 20-12.5 MG tablet Take 1 tablet by mouth 2 (two) times a day.     BLACK PEPPER-TURMERIC PO Take 1 drop by mouth in the morning, at noon, and at bedtime.     clobetasol ointment (TEMOVATE) 0.05 % SMARTSIG:1 Topical Daily     clonazePAM (KLONOPIN) 0.5 MG tablet Take by mouth.     cloNIDine (CATAPRES) 0.1 MG tablet Take 0.1 mg by mouth 2 (two) times daily.     cyclobenzaprine  (FLEXERIL ) 10 MG tablet Take by mouth.     dicyclomine (BENTYL) 10 MG capsule Take 10 mg by mouth 3 (three) times daily.     methylphenidate 54 MG PO CR tablet Take 54 mg by mouth every morning.     Multiple Vitamins-Minerals (MULTIVITAMIN WITH MINERALS) tablet Take 1 tablet by mouth daily.     ondansetron  (ZOFRAN ) 4 MG tablet Take 4 mg by mouth every 8 (eight) hours as needed.     rizatriptan (MAXALT) 10 MG tablet Take one full tab at first sign of migraine headache. May repeat once after 2 hrs. Max 2 tabs per 24 hrs.     spironolactone (ALDACTONE) 25 MG tablet Take 1 tablet by mouth  daily.     Vitamin D, Ergocalciferol, (DRISDOL) 1.25 MG (50000 UT) CAPS capsule Take 50,000  Units by mouth every Sunday.  11   No current facility-administered medications for this visit.    Review of Systems General: weight gain, HA, weakness, numbness, visual trouble, hearing loss, bruising; leg pain Skin: negative for changes in color, texture, moles or lesions Eyes: negative for, changes in vision, pain, diplopia HEENT: negative for, change in hearing, pain, discharge, tinnitus, vertigo, voice changes, sore throat, neck masses Breasts: negative for breast lumps Pulmonary: negative for, dyspnea, orthopnea, productive cough Cardiac: negative for, palpitations, syncope, pain, discomfort, pressure Gastrointestinal: nausea GU: frequency Musculoskeletal: negative for, pain, stiffness, swelling, range of motion limitation Hematology: negative for, easy bruising, bleeding Neurologic/Psych: negative for, headaches, seizures, paralysis, weakness, tremor, change in gait, change in sensation, mood swings, depression, anxiety, change in memory  Objective:  Physical Examination:  BP 135/85   Pulse 92   Temp 97.8 F (36.6 C)   Resp 19   Wt 209 lb 12.8 oz (95.2 kg)   SpO2 100%   BMI 30.98 kg/m    ECOG Performance Status: 1 - Symptomatic but completely ambulatory  General appearance: alert, cooperative, and appears stated age HEENT:PERRLA, thyroid  without masses, and trachea midline Lymph node survey: non-palpable, axillary, inguinal, supraclavicular Cardiovascular: regular rate and rhythm, no murmurs or gallops Respiratory: normal air entry, lungs clear to auscultation and no rales, rhonchi or wheezing Abdomen: soft, non-tender, without masses or organomegaly, no hernias, and well healed incision Back: inspection of back is normal Extremities: extremities normal, atraumatic, no cyanosis or edema Skin exam - normal coloration and turgor, no rashes, no suspicious skin lesions  noted. Neurological exam reveals alert, oriented, normal speech, no focal findings or movement disorder noted.  Pelvic: exam chaperoned by nurse. Vulva: white areas on right anterior labia. no masses, tenderness; Vagina: normal vagina; Adnexa: normal adnexa in size, nontender and no masses; Uterus: uterus is normal size, shape, consistency and nontender; Cervix: anteverted; Rectal: not indicated  Lab Review Lab Results  Component Value Date   WBC 19.8 (H) 02/17/2022   HGB 15.0 02/17/2022   HCT 45.7 02/17/2022   MCV 85.3 02/17/2022   PLT 278 02/17/2022     Chemistry      Component Value Date/Time   NA 138 02/17/2022 1756   NA 141 06/12/2014 1401   NA 141 11/17/2013 2059   K 3.7 02/17/2022 1756   K 3.6 11/17/2013 2059   CL 102 02/17/2022 1756   CL 107 11/17/2013 2059   CO2 29 02/17/2022 1756   CO2 23 11/17/2013 2059   BUN 10 02/17/2022 1756   BUN 5 (L) 06/12/2014 1401   BUN 10 11/17/2013 2059   CREATININE 0.60 02/17/2022 1756   CREATININE 0.54 (L) 11/17/2013 2059      Component Value Date/Time   CALCIUM 9.7 02/17/2022 1756   CALCIUM 9.3 11/17/2013 2059   ALKPHOS 79 02/17/2022 1756   ALKPHOS 99 11/17/2013 2059   AST 15 02/17/2022 1756   AST 25 11/17/2013 2059   ALT 10 02/17/2022 1756   ALT 24 11/17/2013 2059   BILITOT 0.9 02/17/2022 1756   BILITOT 0.3 11/17/2013 2059     Radiologic Imaging: 09/29/23 CT scan  FINDINGS: Lower chest: No acute abnormality.   Hepatobiliary: No focal liver abnormality is seen. No gallstones, prior cholecystectomy. No biliary dilatation.   Pancreas: Unremarkable. No pancreatic ductal dilatation or surrounding inflammatory changes.   Spleen: Normal in size without focal abnormality.   Adrenals/Urinary Tract: Adrenal glands are unremarkable. Kidneys are normal, without renal calculi, focal lesion, or hydronephrosis.  Bladder is unremarkable.   Stomach/Bowel: Stomach is within normal limits. Appendix appears normal. No evidence of  bowel wall thickening, distention, or inflammatory changes. Abundant residual fecal material right side: And proximal transverse colon without obstruction or impaction. No diverticulitis. No appendicitis.   Vascular/Lymphatic: No significant vascular findings are present. No enlarged abdominal or pelvic lymph nodes.   Reproductive: Uterus and bilateral adnexa are unremarkable. Right adnexal cyst 3.2 x 2.3 cm. Recommend follow-up pelvic ultrasound in 6-12 months.   Other: No abdominal wall hernia or abnormality. No abdominopelvic ascites.   Musculoskeletal: No acute or significant osseous findings. Postsurgical changes L4-L5.   IMPRESSION: *No acute abnormality of the abdomen or pelvis. *Abundant residual fecal material right side colon and proximal transverse colon without obstruction or impaction. *Right adnexal cyst 3.2 x 2.3 cm. Recommend follow-up pelvic ultrasound in 6-12 months.         Assessment:  KODEE BLACKWOOD is a 53 y.o. P39 female with vulvar irritation and biopsy of right labia 4/25 shows extramammary Paget's disease.  No underlying mass concerning for invasive vulvar cancer.  The appearance is not typical for Paget's disease and she is a bit young to have this disease. She has discontinued Clobetasol.   Melanoma of left leg with negative groin node 5/24.  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 Superficial spreading , nevoid, Clark Level: IV, Breslow thickness: 0.9 mm. No adjuvant therapy.  Will continue follow up with Dermatology.   History of endometrial ablation, perimenopausal. BMI 31 Plan:   Problem List Items Addressed This Visit       Musculoskeletal and Integument   Extramammary Paget's disease - Primary   Discussed with the patient and her Teresa Rhodes and we reviewed various Paget's treatments including surgical excision, Aldera and laser ablation.  I would like to have the pathology reviewed by Dr Lahoma Pigg at [facility].  If there is concern that the  biopsy is small and may not be diagnostic of Paget's or representative of the whole lesion we can do additional biopsies.  If there is a high level of confidence that this is Paget's would recommend Aldera in view of her young age and the multifocal diffuse nature of this disease.  Will decide on next steps after [facility] review done.   CT scan 3/25 with 3.2 cm cyst.  Pelvic US  follow up scheduled in 6 months.    The patient's diagnosis, an outline of the further diagnostic and laboratory studies which will be required, the recommendation, and alternatives were discussed.  All questions were answered to the patient's satisfaction.  A total of 45 minutes were spent with the patient/family today; 50% was spent in education, counseling and coordination of care for Paget's disease.    Hermine Loots, MD   CC:  [facility] [address] [place],  [place] [address] [phone]

## 2023-11-24 ENCOUNTER — Telehealth: Payer: Self-pay

## 2023-11-24 NOTE — Telephone Encounter (Signed)
 [facility] pathology consultation request sent to [facility] pathology for specimen [phone].

## 2023-12-01 ENCOUNTER — Ambulatory Visit: Admitting: Obstetrics and Gynecology

## 2023-12-09 ENCOUNTER — Telehealth: Payer: Self-pay | Admitting: *Deleted

## 2023-12-09 NOTE — Telephone Encounter (Signed)
 Patient was calling saying that she got a call from [facility] about her pathology and she says okay will can you give me the results.  Then that was giving her the information says that you have to get it from [facility].  The patient was just wondering why in the world if pathology could not give her the answers why did they call her and tell her that she has pathology.  I will send the message to Lauren because she is one of the ones that works with the GYN and see what she can do.  Because that everybody is going home now they usually leave about 4 PM

## 2023-12-13 ENCOUNTER — Telehealth: Payer: Self-pay

## 2023-12-13 NOTE — Telephone Encounter (Signed)
 Contacted Ms. Teresa Rhodes with treatment plan for Aldara per Dr. Marella Shams. Appointment arranged for education and instructions with NP.

## 2023-12-13 NOTE — Telephone Encounter (Signed)
 Contacted Ms. Teresa Rhodes with her pathology review report. Pending feedback from Dr. Marella Shams for final treatment plan.

## 2023-12-15 ENCOUNTER — Inpatient Hospital Stay: Admitting: Nurse Practitioner

## 2023-12-15 ENCOUNTER — Encounter: Payer: Self-pay | Admitting: Nurse Practitioner

## 2023-12-15 VITALS — BP 131/90 | HR 87 | Temp 97.6°F | Resp 18 | Ht 69.0 in | Wt 209.0 lb

## 2023-12-15 DIAGNOSIS — C4499 Other specified malignant neoplasm of skin, unspecified: Secondary | ICD-10-CM | POA: Diagnosis not present

## 2023-12-15 DIAGNOSIS — Z800 Family history of malignant neoplasm of digestive organs: Secondary | ICD-10-CM | POA: Diagnosis not present

## 2023-12-15 DIAGNOSIS — C519 Malignant neoplasm of vulva, unspecified: Secondary | ICD-10-CM | POA: Diagnosis present

## 2023-12-15 DIAGNOSIS — Z85820 Personal history of malignant melanoma of skin: Secondary | ICD-10-CM | POA: Diagnosis not present

## 2023-12-15 DIAGNOSIS — Z79899 Other long term (current) drug therapy: Secondary | ICD-10-CM | POA: Diagnosis not present

## 2023-12-15 MED ORDER — IMIQUIMOD 5 % EX CREA: TOPICAL_CREAM | CUTANEOUS | 0 refills | Status: AC

## 2023-12-15 NOTE — Progress Notes (Signed)
 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 G2P1 female who is seen in consultation from Karma Oz, [place] for Vulvar Paget's disease.   In interim, biopsy was reviewed at [facility].   Surgical Pathology: Outside case Review A. Labia, biopsy: extramammary paget disease Comment: Immunohistochemical stains were performed at the outside institution and are received for review and shoe the tumor cells are reactive with ck7, and non-reactive with HMB45. In addition, immunohistochemical stains are performed at [facility] and show that the cells of interest are predominantly positive for GATA3, negative for CDX2, and p63. Scattered atypical cells are positive for BRST-2. Very rare CK20 positive cells are identified. Overall the findings are most consistent with a primary extramammary Paget disease. Clinical correlation is recommended to completely exclude secondary Paget disease. This case was seen in consultation with Dr. Daryle Eon, who agrees with the final interpretation.  - Dr Annett Killer, MD  Dr Marella Shams reviewed pathology and recommended aldara .   She complains of vulvar pain radiating into her hip.     Gynecologic History:  Seen by Gyn APP 11/03/23.  See notes below.  Patient states she has had itching for some time.  Uses astringent about once a month.  Saw derm who noticed white area R labia majora and started on clobetasol oint about a month ago. Pt using daily. Derm was leaving practice so needs f/u with Gyn.  Colposcopy and biopsy 11/03/23 by Gyn PA.      Biopsy of right labia showed extramammary Paget's disease. Immunohistochemical stains show that the lesional cells within the squamous mucosa are positive for CK7 while they are negative for HMB45, SOX10 immunostains and mucicarmine special stain. This immunoprofile is consistent with above interpretation.    [id number] who LMP was No LMP recorded. Patient is perimenopausal., presents today for her annual examination, last annual 1/23.  Her menses are absent for a few yrs with endometrial ablation, used to be occas, light spotting for a few days. Dysmenorrhea none. She does not have intermenstrual bleeding.    Sex activity: single partner, husband; contraception - vasectomy. No vag dryness. C/o "sharp, stabbing pains now during sex, for the past yr. Not every time but in certain positions. Sometimes feels like "insides are falling out" after sex for 10-20 min" at 1/23 annual with neg GYN u/s for pain 2/23. No bleeding with sex. Still having same sx. Also with diffuse pelvic/abd pain in general with nausea/no appetite. Hx of chronic constipation but better recently. Had neg colonoscopy 9/24.   09/29/23 CT ABD/PELVIS WITH PCP: IMPRESSION: *No acute abnormality of the abdomen or pelvis. *Abundant residual fecal material right side colon and proximal transverse colon without obstruction or impaction. *Right adnexal cyst 3.2 x 2.3 cm. Recommend follow-up pelvic ultrasound in 6-12 months   Last Pap: June 21, 2018  Results were: no abnormalities /neg HPV DNA    Last mammogram: 11/18/22 Results were normal, repeat in 12 months. 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 There is no FH of breast cancer. There is no FH of ovarian cancer. The patient does do self-breast exams.    Tobacco use: The patient denies current or previous tobacco use. Alcohol use: none No drug use.  Exercise: moderately active   Colonoscopy: 9/24  with Dr. Elsa Halls, neg per pt, unsure when due again per pt; 2018 with hemorrhoids and neg bx  with Dr. Antony Baumgartner; s/p EGD with bx. Unsure when colonoscopy due again due to possible FH colon cancer in her brother.   She does get adequate calcium and Vitamin D in her diet. Pt getting 4-6 hrs sleep nightly, very exhausted. Could sleep longer but  husband wakes her up.   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 Skin, left medial lower leg, shave - Malignant melanoma Type: Superficial spreading , nevoid Clark Level: IV Breslow thickness: 0.9 mm Growth phase: Vertical, nonpigmented epithelioid cells Mitoses: 0 per square millimeter Nuclear grade: 2/3 Tumor infiltrating lymphocytes: Non-brisk Regression:  Not identified Ulceration:  None Microscopic satellite: Not identified Vascular invasion:  Not identified Perineural invasion: Not identified Associated nevus: None Margins: Focally present in base of biopsy Pathologic (pT, AJCC 8th edition) staging: pT1b  Re-excision 5/24 with no residual and negative left groin node.   Patient says she has pain all over body, longstanding  Problem List: Patient Active Problem List   Diagnosis Date Noted   Extramammary Paget's disease 11/23/2023   Melanoma (HCC) 11/10/2023   Adnexal cyst 11/03/2023   Seizure-like activity (HCC) 03/12/2019   Tingling 01/15/2019   Tingling in extremities 11/11/2018   Climacteric 06/21/2018   Chronic neck pain 05/25/2018   Chronic upper extremity pain 05/25/2018   Chronic bilateral low back pain with bilateral sciatica 05/25/2018   Chronic pain of both lower extremities 05/25/2018   Right foot drop 05/25/2018   Nausea & vomiting 04/07/2017   Seizure (HCC) 12/01/2016   Deep vein thrombosis (DVT) of left lower extremity (HCC) 11/23/2016   Leg swelling 11/23/2016   Pinched nerve 11/23/2016   Right leg pain 11/23/2016   Difficulty sleeping 10/20/2016   Headache disorder 10/20/2016   Chronic idiopathic constipation 12/04/2015   Degeneration of intervertebral disc of lumbosacral region 09/12/2015   Headache, migraine 09/12/2015   Current tobacco use 09/12/2015   Back pain 09/11/2015   Lumbosacral radiculopathy 07/22/2014   White matter abnormality on MRI of brain 06/12/2014   Foot drop 06/12/2014   Right arm weakness 06/12/2014   Past Medical  History: Past Medical History:  Diagnosis Date   Complication of anesthesia    a.) early emergence during neck surgery   Depression    Gallstones    Hypertension    Migraine    Pneumonia    Seizures (HCC) 2010-2013   Brain seizures   Past Surgical History: Past Surgical History:  Procedure Laterality Date   BACK SURGERY     BREAST BIOPSY Left 06/16/2021   papilloma-surgical excised on 07/17/2021   BREAST LUMPECTOMY WITH RADIO FREQUENCY LOCALIZER Left 07/17/2021   Procedure: BREAST LUMPECTOMY WITH RADIO FREQUENCY LOCALIZER;  Surgeon: Conrado Delay, DO;  Location: [facility] ORS;  Service: General;  Laterality: Left;   CHOLECYSTECTOMY  2002   COLONOSCOPY WITH PROPOFOL  N/A 04/10/2017   Procedure: COLONOSCOPY WITH PROPOFOL ;  Surgeon: Luke Salaam, MD;  Location: [facility] ENDOSCOPY;  Service: Gastroenterology;  Laterality: N/A;   ESOPHAGOGASTRODUODENOSCOPY (EGD) WITH PROPOFOL  N/A 04/10/2017   Procedure: ESOPHAGOGASTRODUODENOSCOPY (EGD) WITH PROPOFOL ;  Surgeon: Luke Salaam, MD;  Location: [facility] ENDOSCOPY;  Service: Gastroenterology;  Laterality: N/A;   NECK SURGERY  2005 & 2009   x2   OTHER SURGICAL HISTORY     Melanoma left leg 12/17/2022   WISDOM TOOTH EXTRACTION     OB History:  OB History  Gravida Para Term Preterm AB Living  2 1 1  1 1   SAB IAB Ectopic Multiple Live Births  1    1    #  Outcome Date GA Lbr Len/2nd Weight Sex Type Anes PTL Lv  2 SAB           1 Term            Family History: Family History  Problem Relation Age of Onset   Ulcerative colitis Mother    Ovarian cysts Mother    Kidney disease Father 71   Kidney disease Brother 35   Colon cancer Brother 29   Liver disease Maternal Grandfather    Colon polyps Neg Hx    Esophageal cancer Neg Hx    Kidney cancer Neg Hx    Bladder Cancer Neg Hx    Breast cancer Neg Hx    Social History: Social History   Socioeconomic History   Marital status: Married    Spouse name: Not on file   Number of children: 3   Years  of education: Not on file   Highest education level: Not on file  Occupational History   Occupation: Immunologist  Tobacco Use   Smoking status: Former    Current packs/day: 0.00    Average packs/day: 0.5 packs/day for 3.0 years (1.5 ttl pk-yrs)    Types: Cigarettes    Start date: 10/24/2011    Quit date: 10/24/2014    Years since quitting: 9.1   Smokeless tobacco: Never   Tobacco comments:    pt given handout  Vaping Use   Vaping status: Never Used  Substance and Sexual Activity   Alcohol use: Yes    Comment: Occasional   Drug use: No   Sexual activity: Yes    Birth control/protection: Post-menopausal, Surgical  Other Topics Concern   Not on file  Social History Narrative   Not on file   Social Drivers of Health   Financial Resource Strain: Low Risk  (07/22/2023)   Received from [facility]   Overall Financial Resource Strain (CARDIA)    Difficulty of Paying Living Expenses: Not hard at all  Food Insecurity: No Food Insecurity (07/22/2023)   Received from [facility]   Hunger Vital Sign    Worried About Running Out of Food in the Last Year: Never true    Ran Out of Food in the Last Year: Never true  Transportation Needs: No Transportation Needs (07/22/2023)   Received from [facility] - Transportation    In the past 12 months, has lack of transportation kept you from medical appointments or from getting medications?: No    Lack of Transportation (Non-Medical): No  Physical Activity: Not on file  Stress: Not on file  Social Connections: Not on file  Intimate Partner Violence: Not on file   Allergies: Allergies  Allergen Reactions   Methocarbamol Other (See Comments) and Dermatitis    Made skin red and hot and itchy Skin became red, hot, itchy.   Current Medications: Current Outpatient Medications  Medication Sig Dispense Refill   aspirin EC 81 MG tablet Take 1 tablet by mouth daily.      benazepril-hydrochlorthiazide (LOTENSIN HCT) 20-12.5 MG tablet Take 1 tablet by mouth 2 (two) times a day.     BLACK PEPPER-TURMERIC PO Take 1 drop by mouth in the morning, at noon, and at bedtime. (Patient not taking: Reported on 11/23/2023)     clobetasol ointment (TEMOVATE) 0.05 % SMARTSIG:1 Topical Daily     clonazePAM (KLONOPIN) 0.5 MG tablet Take by mouth.     cloNIDine (CATAPRES) 0.1 MG tablet Take 0.1 mg  by mouth 2 (two) times daily.     cyclobenzaprine  (FLEXERIL ) 10 MG tablet Take by mouth.     dicyclomine (BENTYL) 10 MG capsule Take 10 mg by mouth 3 (three) times daily.     methylphenidate 54 MG PO CR tablet Take 54 mg by mouth every morning.     Multiple Vitamins-Minerals (MULTIVITAMIN WITH MINERALS) tablet Take 1 tablet by mouth daily.     ondansetron  (ZOFRAN ) 4 MG tablet Take 4 mg by mouth every 8 (eight) hours as needed.     rizatriptan (MAXALT) 10 MG tablet Take one full tab at first sign of migraine headache. May repeat once after 2 hrs. Max 2 tabs per 24 hrs.     spironolactone (ALDACTONE) 25 MG tablet Take 1 tablet by mouth daily.     Vitamin D, Ergocalciferol, (DRISDOL) 1.25 MG (50000 UT) CAPS capsule Take 50,000 Units by mouth every Sunday.  11   No current facility-administered medications for this visit.   Review of Systems General:  no complaints Skin: no complaints Eyes: no complaints HEENT: no complaints Breasts: no complaints Pulmonary: no complaints Cardiac: no complaints Gastrointestinal: no complaints Genitourinary/Sexual: no complaints Ob/Gyn: no complaints Musculoskeletal: no complaints Hematology: no complaints Neurologic/Psych: no complaints   Objective:  Physical Examination:  BP (!) 131/90 (BP Location: Left Wrist, Patient Position: Sitting, Cuff Size: Normal)   Pulse 87   Temp 97.6 F (36.4 C) (Tympanic)   Resp 18   Ht 5\' 9"  (1.753 m)   Wt 209 lb (94.8 kg)   SpO2 98%   BMI 30.86 kg/m    ECOG Performance Status: 1 - Symptomatic but  completely ambulatory  GENERAL: Patient is a well appearing female in no acute distress NEURO:  Nonfocal. Well oriented.  Appropriate affect. Anxious appearing  Pelvic: exam chaperoned by cma  Vulva: white areas on right anterior labia. Small ulceration at lower right labia.  Remainder of exam deferred    Assessment:  Teresa Rhodes is a 53 y.o. P15 female with vulvar irritation and biopsy of right labia 4/25 shows extramammary Paget's disease.  No underlying mass concerning for invasive vulvar cancer. Biopsy re-reviewed at [facility] confirming diagnosis of paget's.   Melanoma of left leg with negative groin node 5/24.  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 Superficial spreading , nevoid, Clark Level: IV, Breslow thickness: 0.9 mm. No adjuvant therapy.  Will continue follow up with Dermatology.   History of endometrial ablation, perimenopausal. BMI 31 Plan:   Problem List Items Addressed This Visit   None  Today we discussed paget disease of the vulvar or extramammary paget disease in detail. Reviewed that it accounts for less than 1% of vulvar malignancies however, is more common in older women. Pruritus is most common symptom. Patients with pagets disease of vulvar should also be evaluated for possiblity of synchronous neoplasms including mammogram, colonoscopy, urine cytology, and transvaginal ultrasound, as approximately 20-30% of patients will have a noncontiguous carcinoma (breast, rectal, bladder, urethra, cervix, or ovary).   We discussed options including surgical excision, aldara, vs laser ablation. Aldara was preferred given her age and multifocal diffuse nature of her disease. Advised to apply aldara nightly to affected area for 3 weeks then every other day for following 3 weeks. Leave on skin for 6-10 hours then wash with soap and water . We reviewed skin reactions are common and we may need to take rest periods if intolerable side effects. She will use desitin/vaseline to  non-treatment surfaces. Avoid sexual contact when  cream is applied. Wash hands before and after. Consider wearing cotton underwear after application. Avoid excess cream- use approximately 1/3 to 1/2 packet.   CT scan 3/25 with 3.2 cm cyst.  Pelvic US  follow up scheduled in 6 months.  I'll see her back in 2-3 weeks to assess tolerance or sooner if needed. Plan to follow up with gyn onc in roughly 12 weeks for repeat colposcopy, possible biopsy.   The patient's diagnosis, an outline of the further diagnostic and laboratory studies which will be required, the recommendation, and alternatives were discussed.  All questions were answered to the patient's satisfaction.  A total of 30 minutes were spent with the patient/family today; 50% was spent in education, counseling and coordination of care for Paget's disease.    Nelda Balsam, NP  CC:  Yehuda Helms, MD [address] [facility] [place],  [place] [address] [phone]  CC: Dr Marella Shams

## 2023-12-15 NOTE — Progress Notes (Signed)
 Patient has been feeling like her mucles are tearing away from her bones, ongoing for a while now. She has been using estrogel, with witch hazel. She said that it is more uncomfortable on the inside of her vagina feels like something is tearing her open. Dr. Marella Shams 11/23/2023, and he took pictures. Nausea and lack of appetite. Her shortness of breaht with a little pain right in the middle of the tow shoulder blades of her back.

## 2023-12-15 NOTE — Patient Instructions (Addendum)
 Aldara Cream Application for Paget's  INSTRUCTIONS Aldara cream is to be applied daily for 3 weeks, then apply every other day for the next 3 weeks. Apply prior to normal sleeping hours, and leave on the skin for 6-10 hours. Following the treatment period cream should be removed by washing the treated area with mild soap and water .   Local skin reactions (erythema) at the treatment site are common. A rest period of several days may be taken if required by the patient's discomfort or severity of the local skin reaction. Treatment may resume once the reaction subsides. Non-occlusive dressings such as cotton gauze or cotton underwear may be used in the management of skin reactions.The technique for proper dose administration should be demonstrated by the prescriber to maximize the benefit of Aldara therapy. Handwashing before and after cream application is recommended. Aldara 5% cream is packaged in single-use packets which contain sufficient cream to cover the area of up to 20 cm2; use of excessive amounts of cream should be avoided. Apply Aldara cream to external area. A thin layer is applied to the affected area and rubbed in until the cream is no longer visible. The application site should not be covered.

## 2023-12-26 ENCOUNTER — Encounter: Payer: Self-pay | Admitting: Nurse Practitioner

## 2023-12-30 ENCOUNTER — Inpatient Hospital Stay: Attending: Obstetrics and Gynecology | Admitting: Nurse Practitioner

## 2023-12-30 ENCOUNTER — Encounter: Payer: Self-pay | Admitting: Nurse Practitioner

## 2023-12-30 VITALS — BP 119/81 | HR 77 | Temp 98.7°F | Resp 20 | Wt 212.6 lb

## 2023-12-30 DIAGNOSIS — R110 Nausea: Secondary | ICD-10-CM | POA: Diagnosis not present

## 2023-12-30 DIAGNOSIS — G8929 Other chronic pain: Secondary | ICD-10-CM | POA: Diagnosis not present

## 2023-12-30 DIAGNOSIS — I10 Essential (primary) hypertension: Secondary | ICD-10-CM | POA: Diagnosis not present

## 2023-12-30 DIAGNOSIS — Z800 Family history of malignant neoplasm of digestive organs: Secondary | ICD-10-CM | POA: Insufficient documentation

## 2023-12-30 DIAGNOSIS — Z86718 Personal history of other venous thrombosis and embolism: Secondary | ICD-10-CM | POA: Diagnosis not present

## 2023-12-30 DIAGNOSIS — Z85820 Personal history of malignant melanoma of skin: Secondary | ICD-10-CM | POA: Insufficient documentation

## 2023-12-30 DIAGNOSIS — C519 Malignant neoplasm of vulva, unspecified: Secondary | ICD-10-CM | POA: Insufficient documentation

## 2023-12-30 DIAGNOSIS — C4499 Other specified malignant neoplasm of skin, unspecified: Secondary | ICD-10-CM

## 2023-12-30 DIAGNOSIS — Z87891 Personal history of nicotine dependence: Secondary | ICD-10-CM | POA: Diagnosis not present

## 2023-12-30 DIAGNOSIS — Z7982 Long term (current) use of aspirin: Secondary | ICD-10-CM | POA: Diagnosis not present

## 2023-12-30 DIAGNOSIS — Z79899 Other long term (current) drug therapy: Secondary | ICD-10-CM | POA: Insufficient documentation

## 2023-12-30 DIAGNOSIS — N941 Unspecified dyspareunia: Secondary | ICD-10-CM | POA: Insufficient documentation

## 2023-12-30 NOTE — Progress Notes (Unsigned)
 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 G2P1 female who is seen in consultation from Karma Oz, [place] for Vulvar Paget's disease.   We previously discussed options including surgical excision, aldara , vs laser ablation. Aldara  was preferred given her age and multifocal diffuse nature of her disease.   She has been applying vulvar aldara  nightly x 2 weeks and says that she feels terrible. Endorses using vulvar vaseline on non-treatment surfaces but ongoing vulvar irritation. Is having all over body aches and pains. Her energy is decreased. She continues to experience a pulling sensation of her vulva. Complains of nausea and decreased appetite.   She has had worsening low back pain that radiates to her right leg. She was seen by Dr Claudius Cumins who prescribed oral steroids which she has not yet started. Previously has history of neck surgery. Her husband accompanies her and contributes to history.     Gynecologic History:  Seen by Gyn APP 11/03/23.  See notes below.  Patient states she has had itching for some time.  Uses astringent about once a month.  Saw derm who noticed white area R labia majora and started on clobetasol oint about a month ago. Pt using daily. Derm was leaving practice so needs f/u with Gyn.  Colposcopy and biopsy 11/03/23 by Gyn PA.      Biopsy of right labia showed extramammary Paget's disease. Immunohistochemical stains show that the lesional cells within the squamous mucosa are positive for CK7 while they are negative for HMB45, SOX10 immunostains and mucicarmine special stain. This immunoprofile is consistent with above interpretation.   [id number] who LMP was No LMP recorded. Patient is perimenopausal., presents today for her annual examination, last annual 1/23.  Her menses are absent for a few yrs with endometrial ablation, used to be  occas, light spotting for a few days. Dysmenorrhea none. She does not have intermenstrual bleeding.    Sex activity: single partner, husband; contraception - vasectomy. No vag dryness. C/o sharp, stabbing pains now during sex, for the past yr. Not every time but in certain positions. Sometimes feels like insides are falling out after sex for 10-20 min at 1/23 annual with neg GYN u/s for pain 2/23. No bleeding with sex. Still having same sx. Also with diffuse pelvic/abd pain in general with nausea/no appetite. Hx of chronic constipation but better recently. Had neg colonoscopy 9/24.   09/29/23 CT ABD/PELVIS WITH PCP: IMPRESSION: *No acute abnormality of the abdomen or pelvis. *Abundant residual fecal material right side colon and proximal transverse colon without obstruction or impaction. *Right adnexal cyst 3.2 x 2.3 cm. Recommend follow-up pelvic ultrasound in 6-12 months   Last Pap: June 21, 2018  Results were: no abnormalities /neg HPV DNA    Last mammogram: 11/21/23- Bi-Rads category 1: negative. Repeat in 12 months. 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 There is no FH of breast cancer. There is no FH of ovarian cancer. The patient does do self-breast exams.    Tobacco use: The patient denies current or previous tobacco use. Alcohol use: none No drug use.  Exercise: moderately active   Colonoscopy: 9/24  with Dr. Elsa Halls, neg per pt, unsure when due again per pt; 2018 with hemorrhoids and neg bx with Dr. Antony Baumgartner; s/p EGD with bx. Unsure when colonoscopy due again due to possible FH colon cancer in her  brother.   She does get adequate calcium and Vitamin D in her diet. Pt getting 4-6 hrs sleep nightly, very exhausted. Could sleep longer but husband wakes her up.   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 Skin, left medial lower leg, shave - Malignant melanoma Type: Superficial spreading , nevoid Clark Level: IV Breslow thickness:  0.9 mm Growth phase: Vertical, nonpigmented epithelioid cells Mitoses: 0 per square millimeter Nuclear grade: 2/3 Tumor infiltrating lymphocytes: Non-brisk Regression:  Not identified Ulceration:  None Microscopic satellite: Not identified Vascular invasion:  Not identified Perineural invasion: Not identified Associated nevus: None Margins: Focally present in base of biopsy Pathologic (pT, AJCC 8th edition) staging: pT1b  Re-excision 5/24 with no residual and negative left groin node.   Patient says she has pain all over body, longstanding  Chronic dyspareunia   Problem List: Patient Active Problem List   Diagnosis Date Noted   Extramammary Paget's disease 11/23/2023   Melanoma (HCC) 11/10/2023   Adnexal cyst 11/03/2023   Seizure-like activity (HCC) 03/12/2019   Tingling 01/15/2019   Tingling in extremities 11/11/2018   Climacteric 06/21/2018   Chronic neck pain 05/25/2018   Chronic upper extremity pain 05/25/2018   Chronic bilateral low back pain with bilateral sciatica 05/25/2018   Chronic pain of both lower extremities 05/25/2018   Right foot drop 05/25/2018   Nausea & vomiting 04/07/2017   Seizure (HCC) 12/01/2016   Deep vein thrombosis (DVT) of left lower extremity (HCC) 11/23/2016   Leg swelling 11/23/2016   Pinched nerve 11/23/2016   Right leg pain 11/23/2016   Difficulty sleeping 10/20/2016   Headache disorder 10/20/2016   Chronic idiopathic constipation 12/04/2015   Degeneration of intervertebral disc of lumbosacral region 09/12/2015   Headache, migraine 09/12/2015   Current tobacco use 09/12/2015   Back pain 09/11/2015   Lumbosacral radiculopathy 07/22/2014   White matter abnormality on MRI of brain 06/12/2014   Foot drop 06/12/2014   Right arm weakness 06/12/2014   Past Medical History: Past Medical History:  Diagnosis Date   Complication of anesthesia    a.) early emergence during neck surgery   Depression    Gallstones    Hypertension     Migraine    Pneumonia    Seizures (HCC) 2010-2013   Brain seizures   Past Surgical History: Past Surgical History:  Procedure Laterality Date   BACK SURGERY     BREAST BIOPSY Left 06/16/2021   papilloma-surgical excised on 07/17/2021   BREAST LUMPECTOMY WITH RADIO FREQUENCY LOCALIZER Left 07/17/2021   Procedure: BREAST LUMPECTOMY WITH RADIO FREQUENCY LOCALIZER;  Surgeon: Conrado Delay, DO;  Location: [facility] ORS;  Service: General;  Laterality: Left;   CHOLECYSTECTOMY  2002   COLONOSCOPY WITH PROPOFOL  N/A 04/10/2017   Procedure: COLONOSCOPY WITH PROPOFOL ;  Surgeon: Luke Salaam, MD;  Location: [facility] ENDOSCOPY;  Service: Gastroenterology;  Laterality: N/A;   ESOPHAGOGASTRODUODENOSCOPY (EGD) WITH PROPOFOL  N/A 04/10/2017   Procedure: ESOPHAGOGASTRODUODENOSCOPY (EGD) WITH PROPOFOL ;  Surgeon: Luke Salaam, MD;  Location: [facility] ENDOSCOPY;  Service: Gastroenterology;  Laterality: N/A;   NECK SURGERY  2005 & 2009   x2   OTHER SURGICAL HISTORY     Melanoma left leg 12/17/2022   WISDOM TOOTH EXTRACTION     OB History:  OB History  Gravida Para Term Preterm AB Living  2 1 1  1 1   SAB IAB Ectopic Multiple Live Births  1    1    # Outcome Date GA Lbr Len/2nd Weight Sex Type Anes PTL Lv  2  SAB           1 Term            Family History: Family History  Problem Relation Age of Onset   Ulcerative colitis Mother    Ovarian cysts Mother    Kidney disease Father 33   Kidney disease Brother 62   Colon cancer Brother 7   Liver disease Maternal Grandfather    Colon polyps Neg Hx    Esophageal cancer Neg Hx    Kidney cancer Neg Hx    Bladder Cancer Neg Hx    Breast cancer Neg Hx    Social History: Social History   Socioeconomic History   Marital status: Married    Spouse name: Not on file   Number of children: 3   Years of education: Not on file   Highest education level: Not on file  Occupational History   Occupation: Immunologist  Tobacco Use   Smoking status: Former    Current  packs/day: 0.00    Average packs/day: 0.5 packs/day for 3.0 years (1.5 ttl pk-yrs)    Types: Cigarettes    Start date: 10/24/2011    Quit date: 10/24/2014    Years since quitting: 9.1   Smokeless tobacco: Never   Tobacco comments:    pt given handout  Vaping Use   Vaping status: Never Used  Substance and Sexual Activity   Alcohol use: Yes    Comment: Occasional   Drug use: No   Sexual activity: Yes    Birth control/protection: Post-menopausal, Surgical  Other Topics Concern   Not on file  Social History Narrative   Not on file   Social Drivers of Health   Financial Resource Strain: Low Risk  (07/22/2023)   Received from [facility]   Overall Financial Resource Strain (CARDIA)    Difficulty of Paying Living Expenses: Not hard at all  Food Insecurity: No Food Insecurity (07/22/2023)   Received from [facility]   Hunger Vital Sign    Within the past 12 months, you worried that your food would run out before you got the money to buy more.: Never true    Within the past 12 months, the food you bought just didn't last and you didn't have money to get more.: Never true  Transportation Needs: No Transportation Needs (07/22/2023)   Received from [facility] - Transportation    In the past 12 months, has lack of transportation kept you from medical appointments or from getting medications?: No    Lack of Transportation (Non-Medical): No  Physical Activity: Not on file  Stress: Not on file  Social Connections: Not on file  Intimate Partner Violence: Not on file   Allergies: Allergies  Allergen Reactions   Methocarbamol Other (See Comments) and Dermatitis    Made skin red and hot and itchy Skin became red, hot, itchy.   Current Medications: Current Outpatient Medications  Medication Sig Dispense Refill   aspirin EC 81 MG tablet Take 1 tablet by mouth daily.     benazepril-hydrochlorthiazide (LOTENSIN HCT) 20-12.5 MG  tablet Take 1 tablet by mouth 2 (two) times a day.     BLACK PEPPER-TURMERIC PO Take 1 drop by mouth in the morning, at noon, and at bedtime.     clobetasol ointment (TEMOVATE) 0.05 % SMARTSIG:1 Topical Daily     clonazePAM (KLONOPIN) 0.5 MG tablet Take by mouth.     cloNIDine (CATAPRES) 0.1 MG  tablet Take 0.1 mg by mouth 2 (two) times daily.     cyclobenzaprine  (FLEXERIL ) 10 MG tablet Take by mouth.     dicyclomine (BENTYL) 10 MG capsule Take 10 mg by mouth 3 (three) times daily.     imiquimod  (ALDARA ) 5 % cream Apply to right vulva nightly for three weeks then reduce to three times a week. For pagets. 30 each 0   methylphenidate 54 MG PO CR tablet Take 54 mg by mouth every morning.     Multiple Vitamins-Minerals (MULTIVITAMIN WITH MINERALS) tablet Take 1 tablet by mouth daily.     ondansetron  (ZOFRAN ) 4 MG tablet Take 4 mg by mouth every 8 (eight) hours as needed.     rizatriptan (MAXALT) 10 MG tablet Take one full tab at first sign of migraine headache. May repeat once after 2 hrs. Max 2 tabs per 24 hrs.     spironolactone (ALDACTONE) 25 MG tablet Take 1 tablet by mouth daily.     Vitamin D, Ergocalciferol, (DRISDOL) 1.25 MG (50000 UT) CAPS capsule Take 50,000 Units by mouth every Sunday.  11   No current facility-administered medications for this visit.    Review of Systems General:  fatigue, increased sleepiness, generalized pain Skin: no complaints Eyes: no complaints HEENT: no complaints Breasts: no complaints Pulmonary: no complaints Cardiac: no complaints Gastrointestinal: nausea Genitourinary/Sexual: pulling, ripping pain of vulva Ob/Gyn: no complaints Musculoskeletal: hip pain Hematology: no complaints Neurologic/Psych: dizziness  Objective:  Physical Examination:  BP 119/81   Pulse 77   Temp 98.7 F (37.1 C)   Resp 20   Wt 212 lb 9.6 oz (96.4 kg)   SpO2 100%   BMI 31.40 kg/m    ECOG Performance Status: 1 - Symptomatic but completely ambulatory  GENERAL:  fatigued appearing. Accompanied by spouse.  NEURO:  Nonfocal. Well oriented.  Appropriate affect.   Pelvic: exam chaperoned by cma  Vulva: white areas on right anterior labia. Scattered ulcerations and erythema of right and left labia. Painful to touch.  Internal exam deferred    Assessment:  Teresa Rhodes is a 53 y.o. P80 female with vulvar irritation and biopsy of right labia 4/25 shows extramammary Paget's disease.  No underlying mass concerning for invasive vulvar cancer. Biopsy re-reviewed at [facility] confirming diagnosis of paget's. Started topical imiquimod . Tolerating poorly.   Melanoma of left leg with negative groin node 5/24.  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 Superficial spreading , nevoid, Clark Level: IV, Breslow thickness: 0.9 mm. No adjuvant therapy.  Will continue follow up with Dermatology.   History of endometrial ablation, perimenopausal. BMI 31 Plan:   Problem List Items Addressed This Visit       Musculoskeletal and Integument   Extramammary Paget's disease - Primary   Again discussed extramammary paget disease in detail and risk of malignancy, however < 1% of vulvar malignancies. Pruritus most common symptom.   She is tolerating topical imiquimod  poorly. Low incidence of flu like symptoms reported with imiquimod  and generally improve with acetaminophen . She is having local skin irritation as well. Recommend holding aldara  until ulceration resolves. She will use vaseline/desitin topically to help keep skin moisturized to facilitate healing. Once resolved, plan to restart 3 nights a week. Apply small volume of cream. Reviewed treatment location. Use desitin for nontreatment surfaces. Avoid excess cream, using approximately 1/3 a packet.   CT scan 3/25 with 3.2 cm cyst.  Pelvic US  follow up scheduled in 6 months.  She will reach out to clinic when  she restarts cream and I will plan to see her back 2-3 weeks after restart to assess tolerance. Will plan for her to  see gyn onc approximately 6 weeks after completion of treatment for repeat colposcopy and possible biopsy.   Chronic dyspareunia.   The patient's diagnosis, an outline of the further diagnostic and laboratory studies which will be required, the recommendation, and alternatives were discussed.  All questions were answered to the patient's satisfaction.  A total of 30 minutes were spent with the patient/family today; 50% was spent in education, counseling and coordination of care for Paget's disease.    Nelda Balsam, NP  CC:  Dr Claudius Cumins & Dr Marella Shams

## 2024-01-06 ENCOUNTER — Encounter: Payer: Self-pay | Admitting: Nurse Practitioner

## 2024-01-10 ENCOUNTER — Encounter: Payer: Self-pay | Admitting: Nurse Practitioner

## 2024-01-12 ENCOUNTER — Encounter: Payer: Self-pay | Admitting: Obstetrics and Gynecology

## 2024-01-26 ENCOUNTER — Encounter: Payer: Self-pay | Admitting: Nurse Practitioner

## 2024-01-26 ENCOUNTER — Inpatient Hospital Stay: Attending: Obstetrics and Gynecology | Admitting: Nurse Practitioner

## 2024-01-26 VITALS — BP 138/80 | HR 84 | Temp 97.7°F | Resp 18 | Ht 69.0 in | Wt 219.0 lb

## 2024-01-26 DIAGNOSIS — Z86718 Personal history of other venous thrombosis and embolism: Secondary | ICD-10-CM | POA: Diagnosis not present

## 2024-01-26 DIAGNOSIS — Z7952 Long term (current) use of systemic steroids: Secondary | ICD-10-CM | POA: Insufficient documentation

## 2024-01-26 DIAGNOSIS — I10 Essential (primary) hypertension: Secondary | ICD-10-CM | POA: Diagnosis not present

## 2024-01-26 DIAGNOSIS — R4589 Other symptoms and signs involving emotional state: Secondary | ICD-10-CM

## 2024-01-26 DIAGNOSIS — Z85820 Personal history of malignant melanoma of skin: Secondary | ICD-10-CM | POA: Insufficient documentation

## 2024-01-26 DIAGNOSIS — Z800 Family history of malignant neoplasm of digestive organs: Secondary | ICD-10-CM | POA: Insufficient documentation

## 2024-01-26 DIAGNOSIS — N941 Unspecified dyspareunia: Secondary | ICD-10-CM | POA: Insufficient documentation

## 2024-01-26 DIAGNOSIS — C519 Malignant neoplasm of vulva, unspecified: Secondary | ICD-10-CM | POA: Diagnosis present

## 2024-01-26 DIAGNOSIS — E559 Vitamin D deficiency, unspecified: Secondary | ICD-10-CM | POA: Insufficient documentation

## 2024-01-26 DIAGNOSIS — F17210 Nicotine dependence, cigarettes, uncomplicated: Secondary | ICD-10-CM | POA: Insufficient documentation

## 2024-01-26 DIAGNOSIS — Z79899 Other long term (current) drug therapy: Secondary | ICD-10-CM | POA: Insufficient documentation

## 2024-01-26 DIAGNOSIS — R630 Anorexia: Secondary | ICD-10-CM | POA: Insufficient documentation

## 2024-01-26 DIAGNOSIS — R519 Headache, unspecified: Secondary | ICD-10-CM | POA: Insufficient documentation

## 2024-01-26 NOTE — Progress Notes (Signed)
 Patient has been using the Aldara  cream three times a week, since she has been taking the cream she has had flu like symptoms, nausea, feeling bloated all over, chills no fever. She hasn't had an appetite in a little while now.

## 2024-01-27 NOTE — Progress Notes (Signed)
 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 G2P1 female who is seen in consultation from Bernarda Perfect, Teresa Rhodes for Vulvar Paget's disease.   We previously discussed options including surgical excision, aldara , vs laser ablation. Aldara  was preferred given her age and multifocal diffuse nature of her disease.   Initially applied aldara  nightly x 2 weeks then held x 2 weeks d/t intolerance. She has now completed 3 weeks of 3 time a week treatment. Tolerating better but overall poorly. Ongoing body pain. Increased headaches. Describes loss of appetite but 10 lb weight gain.    Gynecologic History:  Seen by Gyn APP 11/03/23.  See notes below.  Patient states she has had itching for some time.  Uses astringent about once a month.  Saw derm who noticed white area R labia majora and started on clobetasol oint about a month ago. Pt using daily. Derm was leaving practice so needs f/u with Gyn.  Colposcopy and biopsy 11/03/23 by Gyn PA.      Biopsy of right labia showed extramammary Paget's disease. Immunohistochemical stains show that the lesional cells within the squamous mucosa are positive for CK7 while they are negative for HMB45, SOX10 immunostains and mucicarmine special stain. This immunoprofile is consistent with above interpretation.   [name] who LMP was No LMP recorded. Patient is perimenopausal., presents today for her annual examination, last annual 1/23.  Her menses are absent for a few yrs with endometrial ablation, used to be occas, light spotting for a few days. Dysmenorrhea none. She does not have intermenstrual bleeding.    Sex activity: single partner, husband; contraception - vasectomy. No vag dryness. C/o sharp, stabbing pains now during sex, for the past yr. Not every time but in certain positions. Sometimes feels like insides are falling out after  sex for 10-20 min at 1/23 annual with neg GYN u/s for pain 2/23. No bleeding with sex. Still having same sx. Also with diffuse pelvic/abd pain in general with nausea/no appetite. Hx of chronic constipation but better recently. Had neg colonoscopy 9/24.   09/29/23 CT ABD/PELVIS WITH PCP: IMPRESSION: *No acute abnormality of the abdomen or pelvis. *Abundant residual fecal material right side colon and proximal transverse colon without obstruction or impaction. *Right adnexal cyst 3.2 x 2.3 cm. Recommend follow-up pelvic ultrasound in 6-12 months   Last Pap: June 21, 2018  Results were: no abnormalities /neg HPV DNA    Last mammogram: 11/21/23- Bi-Rads category 1: negative. Repeat in 12 months. 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 There is no FH of breast cancer. There is no FH of ovarian cancer. The patient does do self-breast exams.    Tobacco use: The patient denies current or previous tobacco use. Alcohol use: none No drug use.  Exercise: moderately active   Colonoscopy: 9/24  with Dr. Celestina, neg per pt, unsure when due again per pt; 2018 with hemorrhoids and neg bx with Dr. Therisa; s/p EGD with bx. Unsure when colonoscopy due again due to possible FH colon cancer in her brother.   She does get adequate calcium and Vitamin D in her diet. Pt getting 4-6 hrs sleep nightly, very exhausted. Could sleep longer but husband wakes her up.   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 Skin, left medial lower leg, shave - Malignant melanoma Type: Superficial spreading , nevoid Clark Level: IV  Breslow thickness: 0.9 mm Growth phase: Vertical, nonpigmented epithelioid cells Mitoses: 0 per square millimeter Nuclear grade: 2/3 Tumor infiltrating lymphocytes: Non-brisk Regression:  Not identified Ulceration:  None Microscopic satellite: Not identified Vascular invasion:  Not identified Perineural invasion: Not identified Associated nevus:  None Margins: Focally present in base of biopsy Pathologic (pT, AJCC 8th edition) staging: pT1b  Re-excision 5/24 with no residual and negative left groin node.   Patient says she has pain all over body, longstanding  Chronic dyspareunia   Problem List: Patient Active Problem List   Diagnosis Date Noted   Extramammary Paget's disease 11/23/2023   Melanoma (HCC) 11/10/2023   Adnexal cyst 11/03/2023   ASD (atrial septal defect), ostium secundum 04/19/2022   Vitamin D deficiency 11/19/2020   GAD (generalized anxiety disorder) 06/08/2019   Seizure-like activity (HCC) 03/12/2019   Tingling 01/15/2019   Tingling in extremities 11/11/2018   Climacteric 06/21/2018   Chronic neck pain 05/25/2018   Chronic upper extremity pain 05/25/2018   Chronic bilateral low back pain with bilateral sciatica 05/25/2018   Chronic pain of both lower extremities 05/25/2018   Right foot drop 05/25/2018   Nausea & vomiting 04/07/2017   Seizure (HCC) 12/01/2016   Deep vein thrombosis (DVT) of left lower extremity (HCC) 11/23/2016   Leg swelling 11/23/2016   Pinched nerve 11/23/2016   Right leg pain 11/23/2016   Difficulty sleeping 10/20/2016   Headache disorder 10/20/2016   Chronic idiopathic constipation 12/04/2015   Degeneration of intervertebral disc of lumbosacral region 09/12/2015   Headache, migraine 09/12/2015   Back pain 09/11/2015   Lumbosacral radiculopathy 07/22/2014   White matter abnormality on MRI of brain 06/12/2014   Foot drop 06/12/2014   Right arm weakness 06/12/2014   Past Medical History: Past Medical History:  Diagnosis Date   Complication of anesthesia    a.) early emergence during neck surgery   Current tobacco use 09/12/2015   Depression    Gallstones    Hypertension    Migraine    Pneumonia    Seizures (HCC) 2010-2013   Brain seizures   Sprain of right ankle 04/21/2021   Past Surgical History: Past Surgical History:  Procedure Laterality Date   BACK SURGERY      BREAST BIOPSY Left 06/16/2021   papilloma-surgical excised on 07/17/2021   BREAST LUMPECTOMY WITH RADIO FREQUENCY LOCALIZER Left 07/17/2021   Procedure: BREAST LUMPECTOMY WITH RADIO FREQUENCY LOCALIZER;  Surgeon: Tye Millet, DO;  Location: [facility] ORS;  Service: General;  Laterality: Left;   CHOLECYSTECTOMY  2002   COLONOSCOPY WITH PROPOFOL  N/A 04/10/2017   Procedure: COLONOSCOPY WITH PROPOFOL ;  Surgeon: Therisa Bi, MD;  Location: [facility] ENDOSCOPY;  Service: Gastroenterology;  Laterality: N/A;   ESOPHAGOGASTRODUODENOSCOPY (EGD) WITH PROPOFOL  N/A 04/10/2017   Procedure: ESOPHAGOGASTRODUODENOSCOPY (EGD) WITH PROPOFOL ;  Surgeon: Therisa Bi, MD;  Location: [facility] ENDOSCOPY;  Service: Gastroenterology;  Laterality: N/A;   NECK SURGERY  2005 & 2009   x2   OTHER SURGICAL HISTORY     Melanoma left leg 12/17/2022   WISDOM TOOTH EXTRACTION     OB History:  OB History  Gravida Para Term Preterm AB Living  2 1 1  1 1   SAB IAB Ectopic Multiple Live Births  1    1    # Outcome Date GA Lbr Len/2nd Weight Sex Type Anes PTL Lv  2 SAB           1 Term            Family History: Family  History  Problem Relation Age of Onset   Ulcerative colitis Mother    Ovarian cysts Mother    Kidney disease Father 79   Kidney disease Brother 43   Colon cancer Brother 59   Liver disease Maternal Grandfather    Colon polyps Neg Hx    Esophageal cancer Neg Hx    Kidney cancer Neg Hx    Bladder Cancer Neg Hx    Breast cancer Neg Hx    Social History: Social History   Socioeconomic History   Marital status: Married    Spouse name: Not on file   Number of children: 3   Years of education: Not on file   Highest education level: Not on file  Occupational History   Occupation: Immunologist  Tobacco Use   Smoking status: Former    Current packs/day: 0.00    Average packs/day: 0.5 packs/day for 3.0 years (1.5 ttl pk-yrs)    Types: Cigarettes    Start date: 10/24/2011    Quit date: 10/24/2014    Years  since quitting: 9.2   Smokeless tobacco: Never   Tobacco comments:    pt given handout  Vaping Use   Vaping status: Never Used  Substance and Sexual Activity   Alcohol use: Yes    Comment: Occasional   Drug use: No   Sexual activity: Yes    Birth control/protection: Post-menopausal, Surgical  Other Topics Concern   Not on file  Social History Narrative   Not on file   Social Drivers of Health   Financial Resource Strain: Low Risk  (07/22/2023)   Received from [facility]   Overall Financial Resource Strain (CARDIA)    Difficulty of Paying Living Expenses: Not hard at all  Food Insecurity: No Food Insecurity (07/22/2023)   Received from [facility]   Hunger Vital Sign    Within the past 12 months, you worried that your food would run out before you got the money to buy more.: Never true    Within the past 12 months, the food you bought just didn't last and you didn't have money to get more.: Never true  Transportation Needs: No Transportation Needs (07/22/2023)   Received from [facility] - Transportation    In the past 12 months, has lack of transportation kept you from medical appointments or from getting medications?: No    Lack of Transportation (Non-Medical): No  Physical Activity: Not on file  Stress: Not on file  Social Connections: Not on file  Intimate Partner Violence: Not on file   Allergies: Allergies  Allergen Reactions   Methocarbamol Other (See Comments) and Dermatitis    Made skin red and hot and itchy Skin became red, hot, itchy.   Current Medications: Current Outpatient Medications  Medication Sig Dispense Refill   aspirin EC 81 MG tablet Take 1 tablet by mouth daily.     benazepril-hydrochlorthiazide (LOTENSIN HCT) 20-12.5 MG tablet Take 1 tablet by mouth 2 (two) times a day.     BLACK PEPPER-TURMERIC PO Take 1 drop by mouth in the morning, at noon, and at bedtime.     clobetasol ointment  (TEMOVATE) 0.05 % SMARTSIG:1 Topical Daily     clonazePAM (KLONOPIN) 0.5 MG tablet Take by mouth.     cloNIDine (CATAPRES) 0.1 MG tablet Take 0.1 mg by mouth 2 (two) times daily.     cyclobenzaprine  (FLEXERIL ) 10 MG tablet Take by mouth.     dicyclomine (  BENTYL) 10 MG capsule Take 10 mg by mouth 3 (three) times daily.     imiquimod  (ALDARA ) 5 % cream Apply to right vulva nightly for three weeks then reduce to three times a week. For pagets. 30 each 0   methylphenidate 54 MG PO CR tablet Take 54 mg by mouth every morning.     Multiple Vitamins-Minerals (MULTIVITAMIN WITH MINERALS) tablet Take 1 tablet by mouth daily.     ondansetron  (ZOFRAN ) 4 MG tablet Take 4 mg by mouth every 8 (eight) hours as needed.     predniSONE  (DELTASONE ) 20 MG tablet Take 20 mg by mouth daily.     rizatriptan (MAXALT) 10 MG tablet Take one full tab at first sign of migraine headache. May repeat once after 2 hrs. Max 2 tabs per 24 hrs.     spironolactone (ALDACTONE) 25 MG tablet Take 1 tablet by mouth daily.     Vitamin D, Ergocalciferol, (DRISDOL) 1.25 MG (50000 UT) CAPS capsule Take 50,000 Units by mouth every Sunday.  11   No current facility-administered medications for this visit.    Review of Systems General:  fatigue, increased sleepiness, generalized pain, weight gain (10 lbs in past 1.5 months) Skin: no complaints Eyes: no complaints HEENT: no complaints Breasts: no complaints Pulmonary: no complaints Cardiac: no complaints Gastrointestinal: nausea Genitourinary/Sexual: pulling, ripping pain of vulva Ob/Gyn: no complaints Musculoskeletal: hip pain, neck pain Hematology: no complaints Neurologic/Psych: dizziness, mood swings  Objective:  Physical Examination:  BP 138/80 (BP Location: Left Arm, Patient Position: Sitting, Cuff Size: Normal)   Pulse 84   Temp 97.7 F (36.5 C) (Tympanic)   Resp 18   Ht 5' 9 (1.753 m)   Wt 219 lb (99.3 kg)   SpO2 100%   BMI 32.34 kg/m    ECOG Performance  Status: 1 - Symptomatic but completely ambulatory  GENERAL: fatigued appearing. Tearful NEURO:  Nonfocal. Well oriented.  Appropriate affect.   Pelvic: exam chaperoned by cma  Vulva: white areas on right anterior labia. Ulcerations have now healed. Mildly tender. Internal exam deferred    Assessment:  AYN DOMANGUE is a 53 y.o. P59 female with vulvar irritation and biopsy of right labia 4/25 shows extramammary Paget's disease.  No underlying mass concerning for invasive vulvar cancer. Biopsy re-reviewed at [facility] confirming diagnosis of paget's. Started topical imiquimod . Tolerating poorly.   Melanoma of left leg with negative groin node 5/24.  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 Superficial spreading , nevoid, Clark Level: IV, Breslow thickness: 0.9 mm. No adjuvant therapy.  Will continue follow up with Dermatology.   History of endometrial ablation, perimenopausal. BMI 31 Plan:   Problem List Items Addressed This Visit   None  Completed approximately 5 weeks of planned 16 weeks of treatment. Tolerating poorly. Unclear if flu like symptoms are related to imiquimod - improved but persistent. Ongoing pain. Discussion of alternatives today including continuing aldara  vs laser vs surgery. Plan for patient to meet with surgery in 2 weeks for reassessment.   CT scan 3/25 with 3.2 cm cyst.  Pelvic US  follow up scheduled in 6 months.  Chronic dyspareunia.   Discussed cerula care and cbt to help her navigate diagnosis and options moving forward. She is in agreement.   The patient's diagnosis, an outline of the further diagnostic and laboratory studies which will be required, the recommendation, and alternatives were discussed.  All questions were answered to the patient's satisfaction.  A total of 20 minutes were spent with the patient/family  today; 50% was spent in education, counseling and coordination of care for Paget's disease.    Tinnie KANDICE Dawn, NP

## 2024-02-15 ENCOUNTER — Inpatient Hospital Stay: Admitting: Obstetrics and Gynecology

## 2024-02-15 VITALS — BP 153/88 | HR 93 | Temp 98.6°F | Resp 20 | Wt 221.9 lb

## 2024-02-15 DIAGNOSIS — C519 Malignant neoplasm of vulva, unspecified: Secondary | ICD-10-CM | POA: Diagnosis not present

## 2024-02-15 DIAGNOSIS — N83201 Unspecified ovarian cyst, right side: Secondary | ICD-10-CM

## 2024-02-15 DIAGNOSIS — C4499 Other specified malignant neoplasm of skin, unspecified: Secondary | ICD-10-CM

## 2024-02-15 DIAGNOSIS — Z7189 Other specified counseling: Secondary | ICD-10-CM | POA: Diagnosis not present

## 2024-02-15 NOTE — H&P (Signed)
 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 G2P1 female who is seen in consultation from Bernarda Perfect, [place] for Vulvar Paget's disease.   We previously discussed options including surgical excision, aldara , vs laser ablation. Aldara  was preferred given her age and multifocal diffuse nature of her disease. She has completed 5 of planned 16 weeks of aldara  but tolerated poorly. Now held and returns to clinic for discussion of other options.    Gynecologic History:  Seen by Gyn APP 11/03/23.  See notes below.  Patient states she has had itching for some time.  Uses astringent about once a month.  Saw derm who noticed white area R labia majora and started on clobetasol oint about a month ago. Pt using daily. Derm was leaving practice so needs f/u with Gyn.  Colposcopy and biopsy 11/03/23 by Gyn PA.      Biopsy of right labia showed extramammary Paget's disease. Immunohistochemical stains show that the lesional cells within the squamous mucosa are positive for CK7 while they are negative for HMB45, SOX10 immunostains and mucicarmine special stain. This immunoprofile is consistent with above interpretation.   [name] who LMP was No LMP recorded. Patient is perimenopausal., presents today for her annual examination, last annual 1/23.  Her menses are absent for a few yrs with endometrial ablation, used to be occas, light spotting for a few days. Dysmenorrhea none. She does not have intermenstrual bleeding.    Sex activity: single partner, husband; contraception - vasectomy. No vag dryness. C/o sharp, stabbing pains now during sex, for the past yr. Not every time but in certain positions. Sometimes feels like insides are falling out after sex for 10-20 min at 1/23 annual with neg GYN u/s for pain 2/23. No bleeding with sex. Still having same sx. Also with diffuse  pelvic/abd pain in general with nausea/no appetite. Hx of chronic constipation but better recently. Had neg colonoscopy 9/24.   09/29/23 CT ABD/PELVIS WITH PCP: IMPRESSION: *No acute abnormality of the abdomen or pelvis. *Abundant residual fecal material right side colon and proximal transverse colon without obstruction or impaction. *Right adnexal cyst 3.2 x 2.3 cm. Recommend follow-up pelvic ultrasound in 6-12 months   Last Pap: June 21, 2018  Results were: no abnormalities /neg HPV DNA    Last mammogram: 11/21/23- Bi-Rads category 1: negative. Repeat in 12 months. 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 There is no FH of breast cancer. There is no FH of ovarian cancer. The patient does do self-breast exams.    Tobacco use: The patient denies current or previous tobacco use. Alcohol use: none No drug use.  Exercise: moderately active   Colonoscopy: 9/24  with Dr. Celestina, neg per pt, unsure when due again per pt; 2018 with hemorrhoids and neg bx with Dr. Therisa; s/p EGD with bx. Unsure when colonoscopy due again due to possible FH colon cancer in her brother.   She does get adequate calcium and Vitamin D in her diet. Pt getting 4-6 hrs sleep nightly, very exhausted. Could sleep longer but husband wakes her up.   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 Skin, left medial lower leg, shave - Malignant melanoma Type: Superficial spreading , nevoid Clark Level: IV Breslow thickness: 0.9 mm Growth phase: Vertical, nonpigmented epithelioid cells Mitoses: 0 per square millimeter Nuclear grade: 2/3 Tumor infiltrating lymphocytes: Non-brisk Regression:  Not identified Ulceration:  None Microscopic satellite: Not identified Vascular invasion:  Not identified Perineural invasion: Not identified Associated nevus: None Margins: Focally present in base of biopsy Pathologic (pT, AJCC 8th edition) staging: pT1b  Re-excision 5/24 with no  residual and negative left groin node.   Patient says she has pain all over body, longstanding  Chronic dyspareunia   Problem List: Patient Active Problem List   Diagnosis Date Noted   Extramammary Paget's disease 11/23/2023   Melanoma (HCC) 11/10/2023   Adnexal cyst 11/03/2023   ASD (atrial septal defect), ostium secundum 04/19/2022   Vitamin D deficiency 11/19/2020   GAD (generalized anxiety disorder) 06/08/2019   Seizure-like activity (HCC) 03/12/2019   Tingling 01/15/2019   Tingling in extremities 11/11/2018   Climacteric 06/21/2018   Chronic neck pain 05/25/2018   Chronic upper extremity pain 05/25/2018   Chronic bilateral low back pain with bilateral sciatica 05/25/2018   Chronic pain of both lower extremities 05/25/2018   Right foot drop 05/25/2018   Nausea & vomiting 04/07/2017   Seizure (HCC) 12/01/2016   Leg swelling 11/23/2016   Pinched nerve 11/23/2016   Right leg pain 11/23/2016   Difficulty sleeping 10/20/2016   Headache disorder 10/20/2016   Chronic idiopathic constipation 12/04/2015   Degeneration of intervertebral disc of lumbosacral region 09/12/2015   Headache, migraine 09/12/2015   Back pain 09/11/2015   Lumbosacral radiculopathy 07/22/2014   White matter abnormality on MRI of brain 06/12/2014   Foot drop 06/12/2014   Right arm weakness 06/12/2014   Past Medical History: Past Medical History:  Diagnosis Date   Complication of anesthesia    a.) early emergence during neck surgery   Current tobacco use 09/12/2015   Depression    Gallstones    Hypertension    Migraine    Pneumonia    Seizures (HCC) 2010-2013   Brain seizures   Sprain of right ankle 04/21/2021   Past Surgical History: Past Surgical History:  Procedure Laterality Date   BACK SURGERY     BREAST BIOPSY Left 06/16/2021   papilloma-surgical excised on 07/17/2021   BREAST LUMPECTOMY WITH RADIO FREQUENCY LOCALIZER Left 07/17/2021   Procedure: BREAST LUMPECTOMY WITH RADIO  FREQUENCY LOCALIZER;  Surgeon: Tye Millet, DO;  Location: [facility] ORS;  Service: General;  Laterality: Left;   CHOLECYSTECTOMY  2002   COLONOSCOPY WITH PROPOFOL  N/A 04/10/2017   Procedure: COLONOSCOPY WITH PROPOFOL ;  Surgeon: Therisa Bi, MD;  Location: [facility] ENDOSCOPY;  Service: Gastroenterology;  Laterality: N/A;   ESOPHAGOGASTRODUODENOSCOPY (EGD) WITH PROPOFOL  N/A 04/10/2017   Procedure: ESOPHAGOGASTRODUODENOSCOPY (EGD) WITH PROPOFOL ;  Surgeon: Therisa Bi, MD;  Location[facility] ENDOSCOPY;  Service: Gastroenterology;  Laterality: N/A;   NECK SURGERY  2005 & 2009   x2   OTHER SURGICAL HISTORY     Melanoma left leg 12/17/2022   WISDOM TOOTH EXTRACTION     OB History:  OB History  Gravida Para Term Preterm AB Living  2 1 1  1 1   SAB IAB Ectopic Multiple Live Births  1    1    # Outcome Date GA Lbr Len/2nd Weight Sex Type Anes PTL Lv  2 SAB           1 Term            Family History: Family History  Problem Relation Age of Onset   Ulcerative colitis Mother    Ovarian cysts Mother    Kidney disease Father 28   Kidney disease Brother 24   Colon cancer Brother  40   Liver disease Maternal Grandfather    Colon polyps Neg Hx    Esophageal cancer Neg Hx    Kidney cancer Neg Hx    Bladder Cancer Neg Hx    Breast cancer Neg Hx    Social History: Social History   Socioeconomic History   Marital status: Married    Spouse name: Not on file   Number of children: 3   Years of education: Not on file   Highest education level: Not on file  Occupational History   Occupation: Immunologist  Tobacco Use   Smoking status: Former    Current packs/day: 0.00    Average packs/day: 0.5 packs/day for 3.0 years (1.5 ttl pk-yrs)    Types: Cigarettes    Start date: 10/24/2011    Quit date: 10/24/2014    Years since quitting: 9.3   Smokeless tobacco: Never   Tobacco comments:    pt given handout  Vaping Use   Vaping status: Never Used  Substance and Sexual Activity   Alcohol use: Yes     Comment: Occasional   Drug use: No   Sexual activity: Yes    Birth control/protection: Post-menopausal, Surgical  Other Topics Concern   Not on file  Social History Narrative   Not on file   Social Drivers of Health   Financial Resource Strain: Low Risk  (07/22/2023)   Received from [facility]   Overall Financial Resource Strain (CARDIA)    Difficulty of Paying Living Expenses: Not hard at all  Food Insecurity: No Food Insecurity (07/22/2023)   Received from [facility]   Hunger Vital Sign    Within the past 12 months, you worried that your food would run out before you got the money to buy more.: Never true    Within the past 12 months, the food you bought just didn't last and you didn't have money to get more.: Never true  Transportation Needs: No Transportation Needs (07/22/2023)   Received from [facility] - Transportation    In the past 12 months, has lack of transportation kept you from medical appointments or from getting medications?: No    Lack of Transportation (Non-Medical): No  Physical Activity: Not on file  Stress: Not on file  Social Connections: Not on file  Intimate Partner Violence: Not on file   Allergies: Allergies  Allergen Reactions   Methocarbamol Other (See Comments) and Dermatitis    Made skin red and hot and itchy Skin became red, hot, itchy.   Current Medications: Current Outpatient Medications  Medication Sig Dispense Refill   aspirin EC 81 MG tablet Take 1 tablet by mouth daily.     benazepril-hydrochlorthiazide (LOTENSIN HCT) 20-12.5 MG tablet Take 1 tablet by mouth 2 (two) times a day.     BLACK PEPPER-TURMERIC PO Take 1 drop by mouth in the morning, at noon, and at bedtime.     clobetasol ointment (TEMOVATE) 0.05 % SMARTSIG:1 Topical Daily     clonazePAM (KLONOPIN) 0.5 MG tablet Take by mouth.     cloNIDine (CATAPRES) 0.1 MG tablet Take 0.1 mg by mouth 2 (two) times daily.      cyclobenzaprine  (FLEXERIL ) 10 MG tablet Take by mouth.     dicyclomine (BENTYL) 10 MG capsule Take 10 mg by mouth 3 (three) times daily.     imiquimod  (ALDARA ) 5 % cream Apply to right vulva nightly for three weeks then reduce to three times a  week. For pagets. 30 each 0   methylphenidate 54 MG PO CR tablet Take 54 mg by mouth every morning.     Multiple Vitamins-Minerals (MULTIVITAMIN WITH MINERALS) tablet Take 1 tablet by mouth daily.     ondansetron  (ZOFRAN ) 4 MG tablet Take 4 mg by mouth every 8 (eight) hours as needed.     predniSONE  (DELTASONE ) 20 MG tablet Take 20 mg by mouth daily.     rizatriptan (MAXALT) 10 MG tablet Take one full tab at first sign of migraine headache. May repeat once after 2 hrs. Max 2 tabs per 24 hrs.     spironolactone (ALDACTONE) 25 MG tablet Take 1 tablet by mouth daily.     Vitamin D, Ergocalciferol, (DRISDOL) 1.25 MG (50000 UT) CAPS capsule Take 50,000 Units by mouth every Sunday.  11   No current facility-administered medications for this visit.    Review of Systems General:  fatigue,generalized weakness, weight gain  Skin: no complaints Eyes: no complaints HEENT: no complaints Breasts: no complaints Pulmonary: no complaints Cardiac: no complaints Gastrointestinal: abdominal distention, nausea, vomiting, diarrhea Genitourinary/Sexual: pulling, ripping pain of vulva (rates 6/10) Ob/Gyn: no complaints Musculoskeletal: hip pain, neck pain Hematology: no complaints Neurologic/Psych: numbness/tingling  Objective:  Physical Examination:  BP (!) 153/88   Pulse 93   Temp 98.6 F (37 C)   Resp 20   Wt 221 lb 14.4 oz (100.7 kg)   SpO2 100%   BMI 32.77 kg/m    ECOG Performance Status: 1 - Symptomatic but completely ambulatory  GENERAL: Patient is a well appearing female in no acute distress HEENT:  Sclera clear. Anicteric NODES:  Negative axillary, supraclavicular, inguinal lymph node survery LUNGS:  Clear to auscultation bilaterally.   HEART:   Regular rate and rhythm.  ABDOMEN:  Soft, nontender. No masses or ascites or hernia EXTREMITIES:  No peripheral edema. Atraumatic. No cyanosis SKIN:  Clear with no obvious rashes or skin changes.  NEURO:  Nonfocal. Well oriented.  Appropriate affect.  Pelvic: exam chaperoned by RN Vulva: Diffuse erythema bilateral vulva. White areas on right anterior labia. Ulcerations 8 mm right mid vulvar. Mildly tender. I Vagina: normal no masses lesions or discharge Cervix: Located posteriorly and no lesions or cervical motion tenderness Uterus: Not enlarged and nontender RV: Not performed  Labs:  12/27/2023 Component Ref Range & Units 1 mo ago  WBC (White Blood Cell Count) 4.1 - 10.2 10^3/uL 6.6  RBC (Red Blood Cell Count) 4.04 - 5.48 10^6/uL 4.93  Hemoglobin 12.0 - 15.0 gm/dL 85.6  Hematocrit 64.9 - 47.0 % 42.4  MCV (Mean Corpuscular Volume) 80.0 - 100.0 fl 86.0  MCH (Mean Corpuscular Hemoglobin) 27.0 - 31.2 pg 29.0  MCHC (Mean Corpuscular Hemoglobin Concentration) 32.0 - 36.0 gm/dL 66.2  Platelet Count 849 - 450 10^3/uL 223  RDW-CV (Red Cell Distribution Width) 11.6 - 14.8 % 12.7  MPV (Mean Platelet Volume) 9.4 - 12.4 fl 11.6  Neutrophils 1.50 - 7.80 10^3/uL 4.26  Lymphocytes 1.00 - 3.60 10^3/uL 2.00  Monocytes 0.00 - 1.50 10^3/uL 0.29  Eosinophils 0.00 - 0.55 10^3/uL 0.01  Basophils 0.00 - 0.09 10^3/uL 0.02  Neutrophil % 32.0 - 70.0 % 64.5  Lymphocyte % 10.0 - 50.0 % 30.3  Monocyte % 4.0 - 13.0 % 4.4  Eosinophil % 1.0 - 5.0 % 0.2 Low   Basophil% 0.0 - 2.0 % 0.3  Immature Granulocyte % <=0.7 % 0.3  Immature Granulocyte Count <=0.06 10^3/L 0.02    Component Ref Range & Units 1 mo  ago  Glucose 70 - 110 mg/dL 90  Sodium 863 - 854 mmol/L 139  Potassium 3.6 - 5.1 mmol/L 4.2  Chloride 97 - 109 mmol/L 102  Carbon Dioxide (CO2) 22.0 - 32.0 mmol/L 31.7  Urea Nitrogen (BUN) 7 - 25 mg/dL 8  Creatinine 0.6 - 1.1 mg/dL 0.6  Glomerular Filtration Rate  (eGFR) >60 mL/min/1.73sq m 109  Comment: CKD-EPI (2021) does not include patient's race in the calculation of eGFR.  Monitoring changes of plasma creatinine and eGFR over time is useful for monitoring kidney function.  Interpretive Ranges for eGFR (CKD-EPI 2021):  eGFR:       >60 mL/min/1.73 sq. m - Normal eGFR:       30-59 mL/min/1.73 sq. m - Moderately Decreased eGFR:       15-29 mL/min/1.73 sq. m  - Severely Decreased eGFR:       < 15 mL/min/1.73 sq. m  - Kidney Failure   Note: These eGFR calculations do not apply in acute situations when eGFR is changing rapidly or patients on dialysis.  Calcium 8.7 - 10.3 mg/dL 9.6  AST 8 - 39 U/L 14  ALT 5 - 38 U/L 16  Alk Phos (alkaline Phosphatase) 34 - 104 U/L 96  Albumin 3.5 - 4.8 g/dL 4.4  Bilirubin, Total 0.3 - 1.2 mg/dL 0.3  Protein, Total 6.1 - 7.9 g/dL 7.1  A/G Ratio 1.0 - 5.0 gm/dL 1.6   Radiology:  6/86/7974 CLINICAL DATA:  Abdominal pain located under breast region and within the belly button distribution. Some nausea. No fever.   EXAM: CT ABDOMEN AND PELVIS WITH CONTRAST   TECHNIQUE: Multidetector CT imaging of the abdomen and pelvis was performed using the standard protocol following bolus administration of intravenous contrast.   RADIATION DOSE REDUCTION: This exam was performed according to the departmental dose-optimization program which includes automated exposure control, adjustment of the mA and/or kV according to patient size and/or use of iterative reconstruction technique.   CONTRAST:  75mL ISOVUE -370 IOPAMIDOL  (ISOVUE -370) INJECTION 76%   COMPARISON:  CT abdomen and pelvis February 19, 2022   FINDINGS: Lower chest: No acute abnormality.   Hepatobiliary: No focal liver abnormality is seen. No gallstones, prior cholecystectomy. No biliary dilatation.   Pancreas: Unremarkable. No pancreatic ductal dilatation or surrounding inflammatory changes.   Spleen: Normal in size without focal abnormality.    Adrenals/Urinary Tract: Adrenal glands are unremarkable. Kidneys are normal, without renal calculi, focal lesion, or hydronephrosis. Bladder is unremarkable.   Stomach/Bowel: Stomach is within normal limits. Appendix appears normal. No evidence of bowel wall thickening, distention, or inflammatory changes. Abundant residual fecal material right side: And proximal transverse colon without obstruction or impaction. No diverticulitis. No appendicitis.   Vascular/Lymphatic: No significant vascular findings are present. No enlarged abdominal or pelvic lymph nodes.   Reproductive: Uterus and bilateral adnexa are unremarkable. Right adnexal cyst 3.2 x 2.3 cm. Recommend follow-up pelvic ultrasound in 6-12 months.   Other: No abdominal wall hernia or abnormality. No abdominopelvic ascites.   Musculoskeletal: No acute or significant osseous findings. Postsurgical changes L4-L5.   IMPRESSION: *No acute abnormality of the abdomen or pelvis. *Abundant residual fecal material right side colon and proximal transverse colon without obstruction or impaction. *Right adnexal cyst 3.2 x 2.3 cm. Recommend follow-up pelvic ultrasound in 6-12 months.   Assessment:  Teresa Rhodes is a 53 y.o. P59 female with vulvar irritation and biopsy of right labia 4/25 shows extramammary Paget's disease.  No underlying mass concerning for invasive vulvar cancer.  Biopsy re-reviewed at [facility] confirming diagnosis of paget's. Started topical imiquimod . Tolerating poorly and persistent disease.   Right adnexal cyst 3.2 x 2.3 cm, possibly symptomatic.  Melanoma of left leg with negative groin node 5/24.  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 Superficial spreading , nevoid, Clark Level: IV, Breslow thickness: 0.9 mm. No adjuvant therapy.  Will continue follow up with Dermatology.   History of endometrial ablation, perimenopausal. BMI 31 Plan:   Problem List Items Addressed This Visit       Musculoskeletal and  Integument   Extramammary Paget's disease - Primary   Other Visit Diagnoses       Cyst of right ovary         Counseling and coordination of care          Completed approximately 5 weeks of planned 16 weeks of treatment. Tolerating poorly. Unclear if flu like symptoms are related to imiquimod - improved but persistent. Ongoing pain. Discussion of alternatives today including continuing aldara  vs laser vs surgery. Plan for patient to meet with surgery in 2 weeks for reassessment.   CT scan 3/25 with 3.2 cm cyst.  Pelvic US  follow up scheduled in 6 months which is scheduled for September of this year.  Chronic dyspareunia.   Posted for exam under anesthesia, mapping vulvar biopsies, with Dr Elby on 03/07/2024.   Risks were discussed in detail. These include infection, anesthesia, bleeding, transfusion, medical issues (blood clots, stroke, heart attack, fluid in the lungs, pneumonia, abnormal heart rhythm, death), possible exploratory surgery with larger incision, allergic reaction, injury to adjacent organs (bowel, bladder, blood vessels, nerves), pain and discomfort.     Plan for follow up 4-6 weeks after clinic.      Body mass index is 32.77 kg/m.     Risk Points:  Age 43-60 yrs old   BMI 30+   No invasive cancer diagnosis.  Confirmed no personal diagnosis Family history of blood clot in her brother but it sounds like it was a provoked event he was on dialysis and had a clot of his fistula for dialysis.      RECOMMENDED PROPHYLAXIS:  One preoperative dose unfractionated heparin AND sequential compression devices    The patient's diagnosis, an outline of the further diagnostic and laboratory studies which will be required, the recommendation, and alternatives were discussed.  All questions were answered to the patient's satisfaction.  A total of 80 minutes were spent with the patient/family today; 50% was spent in education, counseling and coordination of care for Paget's  disease.    Torian Quintero Isidor Elby, MD

## 2024-02-15 NOTE — Progress Notes (Signed)
 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 G2P1 female who is seen in consultation from Bernarda Perfect, [place] for Vulvar Paget's disease.   We previously discussed options including surgical excision, aldara , vs laser ablation. Aldara  was preferred given her age and multifocal diffuse nature of her disease. She has completed 5 of planned 16 weeks of aldara  but tolerated poorly. Now held and returns to clinic for discussion of other options.    Gynecologic History:  Seen by Gyn APP 11/03/23.  See notes below.  Patient states she has had itching for some time.  Uses astringent about once a month.  Saw derm who noticed white area R labia majora and started on clobetasol oint about a month ago. Pt using daily. Derm was leaving practice so needs f/u with Gyn.  Colposcopy and biopsy 11/03/23 by Gyn PA.      Biopsy of right labia showed extramammary Paget's disease. Immunohistochemical stains show that the lesional cells within the squamous mucosa are positive for CK7 while they are negative for HMB45, SOX10 immunostains and mucicarmine special stain. This immunoprofile is consistent with above interpretation.   [name] who LMP was No LMP recorded. Patient is perimenopausal., presents today for her annual examination, last annual 1/23.  Her menses are absent for a few yrs with endometrial ablation, used to be occas, light spotting for a few days. Dysmenorrhea none. She does not have intermenstrual bleeding.    Sex activity: single partner, husband; contraception - vasectomy. No vag dryness. C/o sharp, stabbing pains now during sex, for the past yr. Not every time but in certain positions. Sometimes feels like insides are falling out after sex for 10-20 min at 1/23 annual with neg GYN u/s for pain 2/23. No bleeding with sex. Still having same sx. Also with diffuse  pelvic/abd pain in general with nausea/no appetite. Hx of chronic constipation but better recently. Had neg colonoscopy 9/24.   09/29/23 CT ABD/PELVIS WITH PCP: IMPRESSION: *No acute abnormality of the abdomen or pelvis. *Abundant residual fecal material right side colon and proximal transverse colon without obstruction or impaction. *Right adnexal cyst 3.2 x 2.3 cm. Recommend follow-up pelvic ultrasound in 6-12 months   Last Pap: June 21, 2018  Results were: no abnormalities /neg HPV DNA    Last mammogram: 11/21/23- Bi-Rads category 1: negative. Repeat in 12 months. 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 There is no FH of breast cancer. There is no FH of ovarian cancer. The patient does do self-breast exams.    Tobacco use: The patient denies current or previous tobacco use. Alcohol use: none No drug use.  Exercise: moderately active   Colonoscopy: 9/24  with Dr. Celestina, neg per pt, unsure when due again per pt; 2018 with hemorrhoids and neg bx with Dr. Therisa; s/p EGD with bx. Unsure when colonoscopy due again due to possible FH colon cancer in her brother.   She does get adequate calcium and Vitamin D in her diet. Pt getting 4-6 hrs sleep nightly, very exhausted. Could sleep longer but husband wakes her up.   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 Skin, left medial lower leg, shave - Malignant melanoma Type: Superficial spreading , nevoid Clark Level: IV Breslow thickness: 0.9 mm Growth phase: Vertical, nonpigmented epithelioid cells Mitoses: 0 per square millimeter Nuclear grade: 2/3 Tumor infiltrating lymphocytes: Non-brisk Regression:  Not identified Ulceration:  None Microscopic satellite: Not identified Vascular invasion:  Not identified Perineural invasion: Not identified Associated nevus: None Margins: Focally present in base of biopsy Pathologic (pT, AJCC 8th edition) staging: pT1b  Re-excision 5/24 with no  residual and negative left groin node.   Patient says she has pain all over body, longstanding  Chronic dyspareunia   Problem List: Patient Active Problem List   Diagnosis Date Noted   Extramammary Paget's disease 11/23/2023   Melanoma (HCC) 11/10/2023   Adnexal cyst 11/03/2023   ASD (atrial septal defect), ostium secundum 04/19/2022   Vitamin D deficiency 11/19/2020   GAD (generalized anxiety disorder) 06/08/2019   Seizure-like activity (HCC) 03/12/2019   Tingling 01/15/2019   Tingling in extremities 11/11/2018   Climacteric 06/21/2018   Chronic neck pain 05/25/2018   Chronic upper extremity pain 05/25/2018   Chronic bilateral low back pain with bilateral sciatica 05/25/2018   Chronic pain of both lower extremities 05/25/2018   Right foot drop 05/25/2018   Nausea & vomiting 04/07/2017   Seizure (HCC) 12/01/2016   Leg swelling 11/23/2016   Pinched nerve 11/23/2016   Right leg pain 11/23/2016   Difficulty sleeping 10/20/2016   Headache disorder 10/20/2016   Chronic idiopathic constipation 12/04/2015   Degeneration of intervertebral disc of lumbosacral region 09/12/2015   Headache, migraine 09/12/2015   Back pain 09/11/2015   Lumbosacral radiculopathy 07/22/2014   White matter abnormality on MRI of brain 06/12/2014   Foot drop 06/12/2014   Right arm weakness 06/12/2014   Past Medical History: Past Medical History:  Diagnosis Date   Complication of anesthesia    a.) early emergence during neck surgery   Current tobacco use 09/12/2015   Depression    Gallstones    Hypertension    Migraine    Pneumonia    Seizures (HCC) 2010-2013   Brain seizures   Sprain of right ankle 04/21/2021   Past Surgical History: Past Surgical History:  Procedure Laterality Date   BACK SURGERY     BREAST BIOPSY Left 06/16/2021   papilloma-surgical excised on 07/17/2021   BREAST LUMPECTOMY WITH RADIO FREQUENCY LOCALIZER Left 07/17/2021   Procedure: BREAST LUMPECTOMY WITH RADIO  FREQUENCY LOCALIZER;  Surgeon: Tye Millet, DO;  Location: [facility] ORS;  Service: General;  Laterality: Left;   CHOLECYSTECTOMY  2002   COLONOSCOPY WITH PROPOFOL  N/A 04/10/2017   Procedure: COLONOSCOPY WITH PROPOFOL ;  Surgeon: Therisa Bi, MD;  Location: [facility] ENDOSCOPY;  Service: Gastroenterology;  Laterality: N/A;   ESOPHAGOGASTRODUODENOSCOPY (EGD) WITH PROPOFOL  N/A 04/10/2017   Procedure: ESOPHAGOGASTRODUODENOSCOPY (EGD) WITH PROPOFOL ;  Surgeon: Therisa Bi, MD;  Location[facility] ENDOSCOPY;  Service: Gastroenterology;  Laterality: N/A;   NECK SURGERY  2005 & 2009   x2   OTHER SURGICAL HISTORY     Melanoma left leg 12/17/2022   WISDOM TOOTH EXTRACTION     OB History:  OB History  Gravida Para Term Preterm AB Living  2 1 1  1 1   SAB IAB Ectopic Multiple Live Births  1    1    # Outcome Date GA Lbr Len/2nd Weight Sex Type Anes PTL Lv  2 SAB           1 Term            Family History: Family History  Problem Relation Age of Onset   Ulcerative colitis Mother    Ovarian cysts Mother    Kidney disease Father 28   Kidney disease Brother 24   Colon cancer Brother  40   Liver disease Maternal Grandfather    Colon polyps Neg Hx    Esophageal cancer Neg Hx    Kidney cancer Neg Hx    Bladder Cancer Neg Hx    Breast cancer Neg Hx    Social History: Social History   Socioeconomic History   Marital status: Married    Spouse name: Not on file   Number of children: 3   Years of education: Not on file   Highest education level: Not on file  Occupational History   Occupation: Immunologist  Tobacco Use   Smoking status: Former    Current packs/day: 0.00    Average packs/day: 0.5 packs/day for 3.0 years (1.5 ttl pk-yrs)    Types: Cigarettes    Start date: 10/24/2011    Quit date: 10/24/2014    Years since quitting: 9.3   Smokeless tobacco: Never   Tobacco comments:    pt given handout  Vaping Use   Vaping status: Never Used  Substance and Sexual Activity   Alcohol use: Yes     Comment: Occasional   Drug use: No   Sexual activity: Yes    Birth control/protection: Post-menopausal, Surgical  Other Topics Concern   Not on file  Social History Narrative   Not on file   Social Drivers of Health   Financial Resource Strain: Low Risk  (07/22/2023)   Received from [facility]   Overall Financial Resource Strain (CARDIA)    Difficulty of Paying Living Expenses: Not hard at all  Food Insecurity: No Food Insecurity (07/22/2023)   Received from [facility]   Hunger Vital Sign    Within the past 12 months, you worried that your food would run out before you got the money to buy more.: Never true    Within the past 12 months, the food you bought just didn't last and you didn't have money to get more.: Never true  Transportation Needs: No Transportation Needs (07/22/2023)   Received from [facility] - Transportation    In the past 12 months, has lack of transportation kept you from medical appointments or from getting medications?: No    Lack of Transportation (Non-Medical): No  Physical Activity: Not on file  Stress: Not on file  Social Connections: Not on file  Intimate Partner Violence: Not on file   Allergies: Allergies  Allergen Reactions   Methocarbamol Other (See Comments) and Dermatitis    Made skin red and hot and itchy Skin became red, hot, itchy.   Current Medications: Current Outpatient Medications  Medication Sig Dispense Refill   aspirin EC 81 MG tablet Take 1 tablet by mouth daily.     benazepril-hydrochlorthiazide (LOTENSIN HCT) 20-12.5 MG tablet Take 1 tablet by mouth 2 (two) times a day.     BLACK PEPPER-TURMERIC PO Take 1 drop by mouth in the morning, at noon, and at bedtime.     clobetasol ointment (TEMOVATE) 0.05 % SMARTSIG:1 Topical Daily     clonazePAM (KLONOPIN) 0.5 MG tablet Take by mouth.     cloNIDine (CATAPRES) 0.1 MG tablet Take 0.1 mg by mouth 2 (two) times daily.      cyclobenzaprine  (FLEXERIL ) 10 MG tablet Take by mouth.     dicyclomine (BENTYL) 10 MG capsule Take 10 mg by mouth 3 (three) times daily.     imiquimod  (ALDARA ) 5 % cream Apply to right vulva nightly for three weeks then reduce to three times a  week. For pagets. 30 each 0   methylphenidate 54 MG PO CR tablet Take 54 mg by mouth every morning.     Multiple Vitamins-Minerals (MULTIVITAMIN WITH MINERALS) tablet Take 1 tablet by mouth daily.     ondansetron  (ZOFRAN ) 4 MG tablet Take 4 mg by mouth every 8 (eight) hours as needed.     predniSONE  (DELTASONE ) 20 MG tablet Take 20 mg by mouth daily.     rizatriptan (MAXALT) 10 MG tablet Take one full tab at first sign of migraine headache. May repeat once after 2 hrs. Max 2 tabs per 24 hrs.     spironolactone (ALDACTONE) 25 MG tablet Take 1 tablet by mouth daily.     Vitamin D, Ergocalciferol, (DRISDOL) 1.25 MG (50000 UT) CAPS capsule Take 50,000 Units by mouth every Sunday.  11   No current facility-administered medications for this visit.    Review of Systems General:  fatigue,generalized weakness, weight gain  Skin: no complaints Eyes: no complaints HEENT: no complaints Breasts: no complaints Pulmonary: no complaints Cardiac: no complaints Gastrointestinal: abdominal distention, nausea, vomiting, diarrhea Genitourinary/Sexual: pulling, ripping pain of vulva (rates 6/10) Ob/Gyn: no complaints Musculoskeletal: hip pain, neck pain Hematology: no complaints Neurologic/Psych: numbness/tingling  Objective:  Physical Examination:  BP (!) 153/88   Pulse 93   Temp 98.6 F (37 C)   Resp 20   Wt 221 lb 14.4 oz (100.7 kg)   SpO2 100%   BMI 32.77 kg/m    ECOG Performance Status: 1 - Symptomatic but completely ambulatory  GENERAL: Patient is a well appearing female in no acute distress HEENT:  Sclera clear. Anicteric NODES:  Negative axillary, supraclavicular, inguinal lymph node survery LUNGS:  Clear to auscultation bilaterally.   HEART:   Regular rate and rhythm.  ABDOMEN:  Soft, nontender. No masses or ascites or hernia EXTREMITIES:  No peripheral edema. Atraumatic. No cyanosis SKIN:  Clear with no obvious rashes or skin changes.  NEURO:  Nonfocal. Well oriented.  Appropriate affect.  Pelvic: exam chaperoned by RN Vulva: Diffuse erythema bilateral vulva. White areas on right anterior labia. Ulcerations 8 mm right mid vulvar. Mildly tender. I Vagina: normal no masses lesions or discharge Cervix: Located posteriorly and no lesions or cervical motion tenderness Uterus: Not enlarged and nontender RV: Not performed  Labs:  12/27/2023 Component Ref Range & Units 1 mo ago  WBC (White Blood Cell Count) 4.1 - 10.2 10^3/uL 6.6  RBC (Red Blood Cell Count) 4.04 - 5.48 10^6/uL 4.93  Hemoglobin 12.0 - 15.0 gm/dL 85.6  Hematocrit 64.9 - 47.0 % 42.4  MCV (Mean Corpuscular Volume) 80.0 - 100.0 fl 86.0  MCH (Mean Corpuscular Hemoglobin) 27.0 - 31.2 pg 29.0  MCHC (Mean Corpuscular Hemoglobin Concentration) 32.0 - 36.0 gm/dL 66.2  Platelet Count 849 - 450 10^3/uL 223  RDW-CV (Red Cell Distribution Width) 11.6 - 14.8 % 12.7  MPV (Mean Platelet Volume) 9.4 - 12.4 fl 11.6  Neutrophils 1.50 - 7.80 10^3/uL 4.26  Lymphocytes 1.00 - 3.60 10^3/uL 2.00  Monocytes 0.00 - 1.50 10^3/uL 0.29  Eosinophils 0.00 - 0.55 10^3/uL 0.01  Basophils 0.00 - 0.09 10^3/uL 0.02  Neutrophil % 32.0 - 70.0 % 64.5  Lymphocyte % 10.0 - 50.0 % 30.3  Monocyte % 4.0 - 13.0 % 4.4  Eosinophil % 1.0 - 5.0 % 0.2 Low   Basophil% 0.0 - 2.0 % 0.3  Immature Granulocyte % <=0.7 % 0.3  Immature Granulocyte Count <=0.06 10^3/L 0.02    Component Ref Range & Units 1 mo  ago  Glucose 70 - 110 mg/dL 90  Sodium 863 - 854 mmol/L 139  Potassium 3.6 - 5.1 mmol/L 4.2  Chloride 97 - 109 mmol/L 102  Carbon Dioxide (CO2) 22.0 - 32.0 mmol/L 31.7  Urea Nitrogen (BUN) 7 - 25 mg/dL 8  Creatinine 0.6 - 1.1 mg/dL 0.6  Glomerular Filtration Rate  (eGFR) >60 mL/min/1.73sq m 109  Comment: CKD-EPI (2021) does not include patient's race in the calculation of eGFR.  Monitoring changes of plasma creatinine and eGFR over time is useful for monitoring kidney function.  Interpretive Ranges for eGFR (CKD-EPI 2021):  eGFR:       >60 mL/min/1.73 sq. m - Normal eGFR:       30-59 mL/min/1.73 sq. m - Moderately Decreased eGFR:       15-29 mL/min/1.73 sq. m  - Severely Decreased eGFR:       < 15 mL/min/1.73 sq. m  - Kidney Failure   Note: These eGFR calculations do not apply in acute situations when eGFR is changing rapidly or patients on dialysis.  Calcium 8.7 - 10.3 mg/dL 9.6  AST 8 - 39 U/L 14  ALT 5 - 38 U/L 16  Alk Phos (alkaline Phosphatase) 34 - 104 U/L 96  Albumin 3.5 - 4.8 g/dL 4.4  Bilirubin, Total 0.3 - 1.2 mg/dL 0.3  Protein, Total 6.1 - 7.9 g/dL 7.1  A/G Ratio 1.0 - 5.0 gm/dL 1.6   Radiology:  6/86/7974 CLINICAL DATA:  Abdominal pain located under breast region and within the belly button distribution. Some nausea. No fever.   EXAM: CT ABDOMEN AND PELVIS WITH CONTRAST   TECHNIQUE: Multidetector CT imaging of the abdomen and pelvis was performed using the standard protocol following bolus administration of intravenous contrast.   RADIATION DOSE REDUCTION: This exam was performed according to the departmental dose-optimization program which includes automated exposure control, adjustment of the mA and/or kV according to patient size and/or use of iterative reconstruction technique.   CONTRAST:  75mL ISOVUE -370 IOPAMIDOL  (ISOVUE -370) INJECTION 76%   COMPARISON:  CT abdomen and pelvis February 19, 2022   FINDINGS: Lower chest: No acute abnormality.   Hepatobiliary: No focal liver abnormality is seen. No gallstones, prior cholecystectomy. No biliary dilatation.   Pancreas: Unremarkable. No pancreatic ductal dilatation or surrounding inflammatory changes.   Spleen: Normal in size without focal abnormality.    Adrenals/Urinary Tract: Adrenal glands are unremarkable. Kidneys are normal, without renal calculi, focal lesion, or hydronephrosis. Bladder is unremarkable.   Stomach/Bowel: Stomach is within normal limits. Appendix appears normal. No evidence of bowel wall thickening, distention, or inflammatory changes. Abundant residual fecal material right side: And proximal transverse colon without obstruction or impaction. No diverticulitis. No appendicitis.   Vascular/Lymphatic: No significant vascular findings are present. No enlarged abdominal or pelvic lymph nodes.   Reproductive: Uterus and bilateral adnexa are unremarkable. Right adnexal cyst 3.2 x 2.3 cm. Recommend follow-up pelvic ultrasound in 6-12 months.   Other: No abdominal wall hernia or abnormality. No abdominopelvic ascites.   Musculoskeletal: No acute or significant osseous findings. Postsurgical changes L4-L5.   IMPRESSION: *No acute abnormality of the abdomen or pelvis. *Abundant residual fecal material right side colon and proximal transverse colon without obstruction or impaction. *Right adnexal cyst 3.2 x 2.3 cm. Recommend follow-up pelvic ultrasound in 6-12 months.   Assessment:  Teresa Rhodes is a 53 y.o. P59 female with vulvar irritation and biopsy of right labia 4/25 shows extramammary Paget's disease.  No underlying mass concerning for invasive vulvar cancer.  Biopsy re-reviewed at [facility] confirming diagnosis of paget's. Started topical imiquimod . Tolerating poorly and persistent disease.   Right adnexal cyst 3.2 x 2.3 cm, possibly symptomatic.  Melanoma of left leg with negative groin node 5/24.  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 Superficial spreading , nevoid, Clark Level: IV, Breslow thickness: 0.9 mm. No adjuvant therapy.  Will continue follow up with Dermatology.   History of endometrial ablation, perimenopausal. BMI 31 Plan:   Problem List Items Addressed This Visit       Musculoskeletal and  Integument   Extramammary Paget's disease - Primary   Other Visit Diagnoses       Cyst of right ovary         Counseling and coordination of care          Completed approximately 5 weeks of planned 16 weeks of treatment. Tolerating poorly. Unclear if flu like symptoms are related to imiquimod - improved but persistent. Ongoing pain. Discussion of alternatives today including continuing aldara  vs laser vs surgery. Plan for patient to meet with surgery in 2 weeks for reassessment.   CT scan 3/25 with 3.2 cm cyst.  Pelvic US  follow up scheduled in 6 months which is scheduled for September of this year.  Chronic dyspareunia.   Posted for exam under anesthesia, mapping vulvar biopsies, with Dr Elby on 03/07/2024.   Risks were discussed in detail. These include infection, anesthesia, bleeding, transfusion, medical issues (blood clots, stroke, heart attack, fluid in the lungs, pneumonia, abnormal heart rhythm, death), possible exploratory surgery with larger incision, allergic reaction, injury to adjacent organs (bowel, bladder, blood vessels, nerves), pain and discomfort.     Plan for follow up 4-6 weeks after clinic.      Body mass index is 32.77 kg/m.     Risk Points:  Age 43-60 yrs old   BMI 30+   No invasive cancer diagnosis.  Confirmed no personal diagnosis Family history of blood clot in her brother but it sounds like it was a provoked event he was on dialysis and had a clot of his fistula for dialysis.      RECOMMENDED PROPHYLAXIS:  One preoperative dose unfractionated heparin AND sequential compression devices    The patient's diagnosis, an outline of the further diagnostic and laboratory studies which will be required, the recommendation, and alternatives were discussed.  All questions were answered to the patient's satisfaction.  A total of 80 minutes were spent with the patient/family today; 50% was spent in education, counseling and coordination of care for Paget's  disease.    Torian Quintero Isidor Elby, MD

## 2024-02-15 NOTE — Progress Notes (Signed)
 Met with patient to review pre-operative teaching for planned surgery scheduled for March 07, 2024. Pre-admit phone screen appointment discussed. Teaching included but not limited to labs, bowel prep and dietary restrictions, surgical location, pain scales/management, diligent peri care, guidelines to prevent constipation, and importance of early ambulation post operatively and follow-up care. Written instructions for pre-op and post-op care, contact information for questions and concerns provided to patient. Patient able to repeat instructions to nursing staff. No further questions at this time. Patient agrees with plan to proceed with scheduled procedure.

## 2024-02-15 NOTE — H&P (View-Only) (Signed)
 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 G2P1 female who is seen in consultation from Bernarda Perfect, [place] for Vulvar Paget's disease.   We previously discussed options including surgical excision, aldara , vs laser ablation. Aldara  was preferred given her age and multifocal diffuse nature of her disease. She has completed 5 of planned 16 weeks of aldara  but tolerated poorly. Now held and returns to clinic for discussion of other options.    Gynecologic History:  Seen by Gyn APP 11/03/23.  See notes below.  Patient states she has had itching for some time.  Uses astringent about once a month.  Saw derm who noticed white area R labia majora and started on clobetasol oint about a month ago. Pt using daily. Derm was leaving practice so needs f/u with Gyn.  Colposcopy and biopsy 11/03/23 by Gyn PA.      Biopsy of right labia showed extramammary Paget's disease. Immunohistochemical stains show that the lesional cells within the squamous mucosa are positive for CK7 while they are negative for HMB45, SOX10 immunostains and mucicarmine special stain. This immunoprofile is consistent with above interpretation.   [name] who LMP was No LMP recorded. Patient is perimenopausal., presents today for her annual examination, last annual 1/23.  Her menses are absent for a few yrs with endometrial ablation, used to be occas, light spotting for a few days. Dysmenorrhea none. She does not have intermenstrual bleeding.    Sex activity: single partner, husband; contraception - vasectomy. No vag dryness. C/o sharp, stabbing pains now during sex, for the past yr. Not every time but in certain positions. Sometimes feels like insides are falling out after sex for 10-20 min at 1/23 annual with neg GYN u/s for pain 2/23. No bleeding with sex. Still having same sx. Also with diffuse  pelvic/abd pain in general with nausea/no appetite. Hx of chronic constipation but better recently. Had neg colonoscopy 9/24.   09/29/23 CT ABD/PELVIS WITH PCP: IMPRESSION: *No acute abnormality of the abdomen or pelvis. *Abundant residual fecal material right side colon and proximal transverse colon without obstruction or impaction. *Right adnexal cyst 3.2 x 2.3 cm. Recommend follow-up pelvic ultrasound in 6-12 months   Last Pap: June 21, 2018  Results were: no abnormalities /neg HPV DNA    Last mammogram: 11/21/23- Bi-Rads category 1: negative. Repeat in 12 months. 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 There is no FH of breast cancer. There is no FH of ovarian cancer. The patient does do self-breast exams.    Tobacco use: The patient denies current or previous tobacco use. Alcohol use: none No drug use.  Exercise: moderately active   Colonoscopy: 9/24  with Dr. Celestina, neg per pt, unsure when due again per pt; 2018 with hemorrhoids and neg bx with Dr. Therisa; s/p EGD with bx. Unsure when colonoscopy due again due to possible FH colon cancer in her brother.   She does get adequate calcium and Vitamin D in her diet. Pt getting 4-6 hrs sleep nightly, very exhausted. Could sleep longer but husband wakes her up.   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 Skin, left medial lower leg, shave - Malignant melanoma Type: Superficial spreading , nevoid Clark Level: IV Breslow thickness: 0.9 mm Growth phase: Vertical, nonpigmented epithelioid cells Mitoses: 0 per square millimeter Nuclear grade: 2/3 Tumor infiltrating lymphocytes: Non-brisk Regression:  Not identified Ulceration:  None Microscopic satellite: Not identified Vascular invasion:  Not identified Perineural invasion: Not identified Associated nevus: None Margins: Focally present in base of biopsy Pathologic (pT, AJCC 8th edition) staging: pT1b  Re-excision 5/24 with no  residual and negative left groin node.   Patient says she has pain all over body, longstanding  Chronic dyspareunia   Problem List: Patient Active Problem List   Diagnosis Date Noted   Extramammary Paget's disease 11/23/2023   Melanoma (HCC) 11/10/2023   Adnexal cyst 11/03/2023   ASD (atrial septal defect), ostium secundum 04/19/2022   Vitamin D deficiency 11/19/2020   GAD (generalized anxiety disorder) 06/08/2019   Seizure-like activity (HCC) 03/12/2019   Tingling 01/15/2019   Tingling in extremities 11/11/2018   Climacteric 06/21/2018   Chronic neck pain 05/25/2018   Chronic upper extremity pain 05/25/2018   Chronic bilateral low back pain with bilateral sciatica 05/25/2018   Chronic pain of both lower extremities 05/25/2018   Right foot drop 05/25/2018   Nausea & vomiting 04/07/2017   Seizure (HCC) 12/01/2016   Leg swelling 11/23/2016   Pinched nerve 11/23/2016   Right leg pain 11/23/2016   Difficulty sleeping 10/20/2016   Headache disorder 10/20/2016   Chronic idiopathic constipation 12/04/2015   Degeneration of intervertebral disc of lumbosacral region 09/12/2015   Headache, migraine 09/12/2015   Back pain 09/11/2015   Lumbosacral radiculopathy 07/22/2014   White matter abnormality on MRI of brain 06/12/2014   Foot drop 06/12/2014   Right arm weakness 06/12/2014   Past Medical History: Past Medical History:  Diagnosis Date   Complication of anesthesia    a.) early emergence during neck surgery   Current tobacco use 09/12/2015   Depression    Gallstones    Hypertension    Migraine    Pneumonia    Seizures (HCC) 2010-2013   Brain seizures   Sprain of right ankle 04/21/2021   Past Surgical History: Past Surgical History:  Procedure Laterality Date   BACK SURGERY     BREAST BIOPSY Left 06/16/2021   papilloma-surgical excised on 07/17/2021   BREAST LUMPECTOMY WITH RADIO FREQUENCY LOCALIZER Left 07/17/2021   Procedure: BREAST LUMPECTOMY WITH RADIO  FREQUENCY LOCALIZER;  Surgeon: Tye Millet, DO;  Location: [facility] ORS;  Service: General;  Laterality: Left;   CHOLECYSTECTOMY  2002   COLONOSCOPY WITH PROPOFOL  N/A 04/10/2017   Procedure: COLONOSCOPY WITH PROPOFOL ;  Surgeon: Therisa Bi, MD;  Location: [facility] ENDOSCOPY;  Service: Gastroenterology;  Laterality: N/A;   ESOPHAGOGASTRODUODENOSCOPY (EGD) WITH PROPOFOL  N/A 04/10/2017   Procedure: ESOPHAGOGASTRODUODENOSCOPY (EGD) WITH PROPOFOL ;  Surgeon: Therisa Bi, MD;  Location[facility] ENDOSCOPY;  Service: Gastroenterology;  Laterality: N/A;   NECK SURGERY  2005 & 2009   x2   OTHER SURGICAL HISTORY     Melanoma left leg 12/17/2022   WISDOM TOOTH EXTRACTION     OB History:  OB History  Gravida Para Term Preterm AB Living  2 1 1  1 1   SAB IAB Ectopic Multiple Live Births  1    1    # Outcome Date GA Lbr Len/2nd Weight Sex Type Anes PTL Lv  2 SAB           1 Term            Family History: Family History  Problem Relation Age of Onset   Ulcerative colitis Mother    Ovarian cysts Mother    Kidney disease Father 28   Kidney disease Brother 24   Colon cancer Brother  40   Liver disease Maternal Grandfather    Colon polyps Neg Hx    Esophageal cancer Neg Hx    Kidney cancer Neg Hx    Bladder Cancer Neg Hx    Breast cancer Neg Hx    Social History: Social History   Socioeconomic History   Marital status: Married    Spouse name: Not on file   Number of children: 3   Years of education: Not on file   Highest education level: Not on file  Occupational History   Occupation: Immunologist  Tobacco Use   Smoking status: Former    Current packs/day: 0.00    Average packs/day: 0.5 packs/day for 3.0 years (1.5 ttl pk-yrs)    Types: Cigarettes    Start date: 10/24/2011    Quit date: 10/24/2014    Years since quitting: 9.3   Smokeless tobacco: Never   Tobacco comments:    pt given handout  Vaping Use   Vaping status: Never Used  Substance and Sexual Activity   Alcohol use: Yes     Comment: Occasional   Drug use: No   Sexual activity: Yes    Birth control/protection: Post-menopausal, Surgical  Other Topics Concern   Not on file  Social History Narrative   Not on file   Social Drivers of Health   Financial Resource Strain: Low Risk  (07/22/2023)   Received from [facility]   Overall Financial Resource Strain (CARDIA)    Difficulty of Paying Living Expenses: Not hard at all  Food Insecurity: No Food Insecurity (07/22/2023)   Received from [facility]   Hunger Vital Sign    Within the past 12 months, you worried that your food would run out before you got the money to buy more.: Never true    Within the past 12 months, the food you bought just didn't last and you didn't have money to get more.: Never true  Transportation Needs: No Transportation Needs (07/22/2023)   Received from [facility] - Transportation    In the past 12 months, has lack of transportation kept you from medical appointments or from getting medications?: No    Lack of Transportation (Non-Medical): No  Physical Activity: Not on file  Stress: Not on file  Social Connections: Not on file  Intimate Partner Violence: Not on file   Allergies: Allergies  Allergen Reactions   Methocarbamol Other (See Comments) and Dermatitis    Made skin red and hot and itchy Skin became red, hot, itchy.   Current Medications: Current Outpatient Medications  Medication Sig Dispense Refill   aspirin EC 81 MG tablet Take 1 tablet by mouth daily.     benazepril-hydrochlorthiazide (LOTENSIN HCT) 20-12.5 MG tablet Take 1 tablet by mouth 2 (two) times a day.     BLACK PEPPER-TURMERIC PO Take 1 drop by mouth in the morning, at noon, and at bedtime.     clobetasol ointment (TEMOVATE) 0.05 % SMARTSIG:1 Topical Daily     clonazePAM (KLONOPIN) 0.5 MG tablet Take by mouth.     cloNIDine (CATAPRES) 0.1 MG tablet Take 0.1 mg by mouth 2 (two) times daily.      cyclobenzaprine  (FLEXERIL ) 10 MG tablet Take by mouth.     dicyclomine (BENTYL) 10 MG capsule Take 10 mg by mouth 3 (three) times daily.     imiquimod  (ALDARA ) 5 % cream Apply to right vulva nightly for three weeks then reduce to three times a  week. For pagets. 30 each 0   methylphenidate 54 MG PO CR tablet Take 54 mg by mouth every morning.     Multiple Vitamins-Minerals (MULTIVITAMIN WITH MINERALS) tablet Take 1 tablet by mouth daily.     ondansetron  (ZOFRAN ) 4 MG tablet Take 4 mg by mouth every 8 (eight) hours as needed.     predniSONE  (DELTASONE ) 20 MG tablet Take 20 mg by mouth daily.     rizatriptan (MAXALT) 10 MG tablet Take one full tab at first sign of migraine headache. May repeat once after 2 hrs. Max 2 tabs per 24 hrs.     spironolactone (ALDACTONE) 25 MG tablet Take 1 tablet by mouth daily.     Vitamin D, Ergocalciferol, (DRISDOL) 1.25 MG (50000 UT) CAPS capsule Take 50,000 Units by mouth every Sunday.  11   No current facility-administered medications for this visit.    Review of Systems General:  fatigue,generalized weakness, weight gain  Skin: no complaints Eyes: no complaints HEENT: no complaints Breasts: no complaints Pulmonary: no complaints Cardiac: no complaints Gastrointestinal: abdominal distention, nausea, vomiting, diarrhea Genitourinary/Sexual: pulling, ripping pain of vulva (rates 6/10) Ob/Gyn: no complaints Musculoskeletal: hip pain, neck pain Hematology: no complaints Neurologic/Psych: numbness/tingling  Objective:  Physical Examination:  BP (!) 153/88   Pulse 93   Temp 98.6 F (37 C)   Resp 20   Wt 221 lb 14.4 oz (100.7 kg)   SpO2 100%   BMI 32.77 kg/m    ECOG Performance Status: 1 - Symptomatic but completely ambulatory  GENERAL: Patient is a well appearing female in no acute distress HEENT:  Sclera clear. Anicteric NODES:  Negative axillary, supraclavicular, inguinal lymph node survery LUNGS:  Clear to auscultation bilaterally.   HEART:   Regular rate and rhythm.  ABDOMEN:  Soft, nontender. No masses or ascites or hernia EXTREMITIES:  No peripheral edema. Atraumatic. No cyanosis SKIN:  Clear with no obvious rashes or skin changes.  NEURO:  Nonfocal. Well oriented.  Appropriate affect.  Pelvic: exam chaperoned by RN Vulva: Diffuse erythema bilateral vulva. White areas on right anterior labia. Ulcerations 8 mm right mid vulvar. Mildly tender. I Vagina: normal no masses lesions or discharge Cervix: Located posteriorly and no lesions or cervical motion tenderness Uterus: Not enlarged and nontender RV: Not performed  Labs:  12/27/2023 Component Ref Range & Units 1 mo ago  WBC (White Blood Cell Count) 4.1 - 10.2 10^3/uL 6.6  RBC (Red Blood Cell Count) 4.04 - 5.48 10^6/uL 4.93  Hemoglobin 12.0 - 15.0 gm/dL 85.6  Hematocrit 64.9 - 47.0 % 42.4  MCV (Mean Corpuscular Volume) 80.0 - 100.0 fl 86.0  MCH (Mean Corpuscular Hemoglobin) 27.0 - 31.2 pg 29.0  MCHC (Mean Corpuscular Hemoglobin Concentration) 32.0 - 36.0 gm/dL 66.2  Platelet Count 849 - 450 10^3/uL 223  RDW-CV (Red Cell Distribution Width) 11.6 - 14.8 % 12.7  MPV (Mean Platelet Volume) 9.4 - 12.4 fl 11.6  Neutrophils 1.50 - 7.80 10^3/uL 4.26  Lymphocytes 1.00 - 3.60 10^3/uL 2.00  Monocytes 0.00 - 1.50 10^3/uL 0.29  Eosinophils 0.00 - 0.55 10^3/uL 0.01  Basophils 0.00 - 0.09 10^3/uL 0.02  Neutrophil % 32.0 - 70.0 % 64.5  Lymphocyte % 10.0 - 50.0 % 30.3  Monocyte % 4.0 - 13.0 % 4.4  Eosinophil % 1.0 - 5.0 % 0.2 Low   Basophil% 0.0 - 2.0 % 0.3  Immature Granulocyte % <=0.7 % 0.3  Immature Granulocyte Count <=0.06 10^3/L 0.02    Component Ref Range & Units 1 mo  ago  Glucose 70 - 110 mg/dL 90  Sodium 863 - 854 mmol/L 139  Potassium 3.6 - 5.1 mmol/L 4.2  Chloride 97 - 109 mmol/L 102  Carbon Dioxide (CO2) 22.0 - 32.0 mmol/L 31.7  Urea Nitrogen (BUN) 7 - 25 mg/dL 8  Creatinine 0.6 - 1.1 mg/dL 0.6  Glomerular Filtration Rate  (eGFR) >60 mL/min/1.73sq m 109  Comment: CKD-EPI (2021) does not include patient's race in the calculation of eGFR.  Monitoring changes of plasma creatinine and eGFR over time is useful for monitoring kidney function.  Interpretive Ranges for eGFR (CKD-EPI 2021):  eGFR:       >60 mL/min/1.73 sq. m - Normal eGFR:       30-59 mL/min/1.73 sq. m - Moderately Decreased eGFR:       15-29 mL/min/1.73 sq. m  - Severely Decreased eGFR:       < 15 mL/min/1.73 sq. m  - Kidney Failure   Note: These eGFR calculations do not apply in acute situations when eGFR is changing rapidly or patients on dialysis.  Calcium 8.7 - 10.3 mg/dL 9.6  AST 8 - 39 U/L 14  ALT 5 - 38 U/L 16  Alk Phos (alkaline Phosphatase) 34 - 104 U/L 96  Albumin 3.5 - 4.8 g/dL 4.4  Bilirubin, Total 0.3 - 1.2 mg/dL 0.3  Protein, Total 6.1 - 7.9 g/dL 7.1  A/G Ratio 1.0 - 5.0 gm/dL 1.6   Radiology:  6/86/7974 CLINICAL DATA:  Abdominal pain located under breast region and within the belly button distribution. Some nausea. No fever.   EXAM: CT ABDOMEN AND PELVIS WITH CONTRAST   TECHNIQUE: Multidetector CT imaging of the abdomen and pelvis was performed using the standard protocol following bolus administration of intravenous contrast.   RADIATION DOSE REDUCTION: This exam was performed according to the departmental dose-optimization program which includes automated exposure control, adjustment of the mA and/or kV according to patient size and/or use of iterative reconstruction technique.   CONTRAST:  75mL ISOVUE -370 IOPAMIDOL  (ISOVUE -370) INJECTION 76%   COMPARISON:  CT abdomen and pelvis February 19, 2022   FINDINGS: Lower chest: No acute abnormality.   Hepatobiliary: No focal liver abnormality is seen. No gallstones, prior cholecystectomy. No biliary dilatation.   Pancreas: Unremarkable. No pancreatic ductal dilatation or surrounding inflammatory changes.   Spleen: Normal in size without focal abnormality.    Adrenals/Urinary Tract: Adrenal glands are unremarkable. Kidneys are normal, without renal calculi, focal lesion, or hydronephrosis. Bladder is unremarkable.   Stomach/Bowel: Stomach is within normal limits. Appendix appears normal. No evidence of bowel wall thickening, distention, or inflammatory changes. Abundant residual fecal material right side: And proximal transverse colon without obstruction or impaction. No diverticulitis. No appendicitis.   Vascular/Lymphatic: No significant vascular findings are present. No enlarged abdominal or pelvic lymph nodes.   Reproductive: Uterus and bilateral adnexa are unremarkable. Right adnexal cyst 3.2 x 2.3 cm. Recommend follow-up pelvic ultrasound in 6-12 months.   Other: No abdominal wall hernia or abnormality. No abdominopelvic ascites.   Musculoskeletal: No acute or significant osseous findings. Postsurgical changes L4-L5.   IMPRESSION: *No acute abnormality of the abdomen or pelvis. *Abundant residual fecal material right side colon and proximal transverse colon without obstruction or impaction. *Right adnexal cyst 3.2 x 2.3 cm. Recommend follow-up pelvic ultrasound in 6-12 months.   Assessment:  Teresa Rhodes is a 53 y.o. P59 female with vulvar irritation and biopsy of right labia 4/25 shows extramammary Paget's disease.  No underlying mass concerning for invasive vulvar cancer.  Biopsy re-reviewed at [facility] confirming diagnosis of paget's. Started topical imiquimod . Tolerating poorly and persistent disease.   Right adnexal cyst 3.2 x 2.3 cm, possibly symptomatic.  Melanoma of left leg with negative groin node 5/24.  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 Superficial spreading , nevoid, Clark Level: IV, Breslow thickness: 0.9 mm. No adjuvant therapy.  Will continue follow up with Dermatology.   History of endometrial ablation, perimenopausal. BMI 31 Plan:   Problem List Items Addressed This Visit       Musculoskeletal and  Integument   Extramammary Paget's disease - Primary   Other Visit Diagnoses       Cyst of right ovary         Counseling and coordination of care          Completed approximately 5 weeks of planned 16 weeks of treatment. Tolerating poorly. Unclear if flu like symptoms are related to imiquimod - improved but persistent. Ongoing pain. Discussion of alternatives today including continuing aldara  vs laser vs surgery. Plan for patient to meet with surgery in 2 weeks for reassessment.   CT scan 3/25 with 3.2 cm cyst.  Pelvic US  follow up scheduled in 6 months which is scheduled for September of this year.  Chronic dyspareunia.   Posted for exam under anesthesia, mapping vulvar biopsies, with Dr Elby on 03/07/2024.   Risks were discussed in detail. These include infection, anesthesia, bleeding, transfusion, medical issues (blood clots, stroke, heart attack, fluid in the lungs, pneumonia, abnormal heart rhythm, death), possible exploratory surgery with larger incision, allergic reaction, injury to adjacent organs (bowel, bladder, blood vessels, nerves), pain and discomfort.     Plan for follow up 4-6 weeks after clinic.      Body mass index is 32.77 kg/m.     Risk Points:  Age 43-60 yrs old   BMI 30+   No invasive cancer diagnosis.  Confirmed no personal diagnosis Family history of blood clot in her brother but it sounds like it was a provoked event he was on dialysis and had a clot of his fistula for dialysis.      RECOMMENDED PROPHYLAXIS:  One preoperative dose unfractionated heparin AND sequential compression devices    The patient's diagnosis, an outline of the further diagnostic and laboratory studies which will be required, the recommendation, and alternatives were discussed.  All questions were answered to the patient's satisfaction.  A total of 80 minutes were spent with the patient/family today; 50% was spent in education, counseling and coordination of care for Paget's  disease.    Torian Quintero Isidor Elby, MD

## 2024-02-15 NOTE — Patient Instructions (Signed)
 Your surgery will be scheduled March 07, 2024 You will have a pre-admission telephone visit prior to surgery. This visit will take around 40-45 minutes. Gyn oncology will see you 4-6 weeks following surgery for a post operative visit.        Vulvectomy, Wide Local Excision, or Laser Ablation: Surgical Information  The vulva is the external female genitalia, outside and around the vagina and pubic bone. It consists of:  The skin on, and in front of, the pubic bone.  The clitoris.  The labia majora (large lips) on the outside of the vagina.  The labia minora (small lips) around the opening of the vagina.  The opening and the skin in and around the vagina.   A vulvectomy or wide local excision of the vulva is the removal of the tissue of the vulva, which sometimes includes removal of the lymph nodes and tissue in the groin areas.  After surgery, a doctor will look at the tissue under a microscope. He or she will check it for abnormal cells.    Laser ablation is a procedure used to treat precancerous skin changes. The top layers of skin are removed with the laser. The wound is then covered with ointment. A scab will form over the area. The wound may take 3 to 6 week s to heal, depending on the size of the area treated. Good wound care may help the scar fade with time.  Most women go home 1 to 4 hours after surgery. You may need to stay overnight if you had lymph nodes removed. You will probably be able to return to your normal routine in 1 or 2 weeks.    Incision Care after surgery Do not douche or use tampons  Do not have sexual intercourse until your caregiver gives you permission.  After moving your bowels or urinating use a bottle filled with warm water  (peri-bottle) to clean the area. Gently wipe from front to back or pat the skin dry. You may use a blow dryer on the cool setting to help dry the skin.  Wear loose cotton underwear. Do not wear tight pants.  A sitz bath will help keep your  perineal area clean, reduce swelling, and provide comfort.  You may notice that your urine stream is at a different angle, and may spray. Using a plastic funnel may help decrease spray  Avoid exercises, such as running or biking for 3 weeks or as directed.  If your doctor has removed lymph nodes from your groin area, there may be an increase in swelling of your legs and feet. You can help prevent swelling by doing the following:    Elevate your legs while sitting or lying down.    Avoid standing in one place for long periods of time.    Avoid salt in your diet. It can cause fluid retention and swelling.    Do not cross your legs, especially when sitting.   Follow-up care is a key part of your treatment and safety. Be sure to make and go to all appointments, and call your doctor if you are having problems. It's also a good idea to know your test results and keep a list of the me dicines you take.

## 2024-02-28 ENCOUNTER — Encounter
Admission: RE | Admit: 2024-02-28 | Discharge: 2024-02-28 | Disposition: A | Discharge status: Home / Self Care | Source: Ambulatory Visit | Attending: Obstetrics and Gynecology | Admitting: Obstetrics and Gynecology

## 2024-02-28 ENCOUNTER — Other Ambulatory Visit: Payer: Self-pay

## 2024-02-28 DIAGNOSIS — Z01810 Encounter for preprocedural cardiovascular examination: Secondary | ICD-10-CM | POA: Diagnosis present

## 2024-02-28 DIAGNOSIS — I10 Essential (primary) hypertension: Secondary | ICD-10-CM | POA: Diagnosis not present

## 2024-02-28 DIAGNOSIS — Z01812 Encounter for preprocedural laboratory examination: Secondary | ICD-10-CM | POA: Diagnosis present

## 2024-02-28 DIAGNOSIS — Z01818 Encounter for other preprocedural examination: Secondary | ICD-10-CM | POA: Insufficient documentation

## 2024-02-28 DIAGNOSIS — C4499 Other specified malignant neoplasm of skin, unspecified: Secondary | ICD-10-CM | POA: Insufficient documentation

## 2024-02-28 HISTORY — DX: Attention-deficit hyperactivity disorder, unspecified type: F90.9

## 2024-02-28 HISTORY — DX: Cardiac murmur, unspecified: R01.1

## 2024-02-28 HISTORY — DX: Other specified malignant neoplasm of skin, unspecified: C44.99

## 2024-02-28 HISTORY — DX: Personal history of urinary calculi: Z87.442

## 2024-02-28 LAB — TYPE AND SCREEN
ABO/RH(D): O POS
Antibody Screen: NEGATIVE

## 2024-02-28 NOTE — Patient Instructions (Addendum)
 Your procedure is scheduled on: 03/07/24 - Wednesday Report to the Registration Desk on the 1st floor of the [facility]. To find out your arrival time, please call [phone] between 1PM - 3PM on: 03/06/24 - Tuesday If your arrival time is 6:00 am, do not arrive before that time as the [facility] entrance doors do not open until 6:00 am.  REMEMBER: Instructions that are not followed completely may result in serious medical risk, up to and including death; or upon the discretion of your surgeon and anesthesiologist your surgery may need to be rescheduled.  Do not eat food after midnight the night before surgery.  No gum chewing or hard candies.  You may however, drink CLEAR liquids up to 2 hours before you are scheduled to arrive for your surgery. Do not drink anything within 2 hours of your scheduled arrival time.  Clear liquids include: - water   - apple juice without pulp - gatorade (not RED colors) - black coffee or tea (Do NOT add milk or creamers to the coffee or tea) Do NOT drink anything that is not on this list.  In addition, your doctor has ordered for you to drink the provided:  Ensure Pre-Surgery Clear Carbohydrate Drink  Drinking this carbohydrate drink up to two hours before surgery helps to reduce insulin resistance and improve patient outcomes. Please complete drinking 2 hours before scheduled arrival time.  One week prior to surgery: Stop Anti-inflammatories (NSAIDS) such as Advil , Aleve, Ibuprofen , Motrin , Naproxen, Naprosyn and Aspirin  based products such as Excedrin , Goody's Powder, BC Powder. You may take Tylenol  if needed for pain up until the day of surgery.  Stop ANY OVER THE COUNTER supplements until after surgery.   ON THE DAY OF SURGERY ONLY TAKE THESE MEDICATIONS WITH SIPS OF WATER :  ADDERALL    No Alcohol for 24 hours before or after surgery.  No Smoking including e-cigarettes for 24 hours before surgery.  No chewable tobacco products for at  least 6 hours before surgery.  No nicotine patches on the day of surgery.  Do not use any recreational drugs for at least a week (preferably 2 weeks) before your surgery.  Please be advised that the combination of cocaine and anesthesia may have negative outcomes, up to and including death. If you test positive for cocaine, your surgery will be cancelled.  On the morning of surgery brush your teeth with toothpaste and water , you may rinse your mouth with mouthwash if you wish. Do not swallow any toothpaste or mouthwash.  Do not wear jewelry, make-up, hairpins, clips or nail polish.  For welded (permanent) jewelry: bracelets, anklets, waist bands, etc.  Please have this removed prior to surgery.  If it is not removed, there is a chance that hospital personnel will need to cut it off on the day of surgery.  Do not wear lotions, powders, or perfumes.   Do not shave body hair from the neck down 48 hours before surgery.  Contact lenses, hearing aids and dentures may not be worn into surgery.  Do not bring valuables to the hospital. [facility] is not responsible for any missing/lost belongings or valuables.   Notify your doctor if there is any change in your medical condition (cold, fever, infection).  Wear comfortable clothing (specific to your surgery type) to the hospital.  After surgery, you can help prevent lung complications by doing breathing exercises.  Take deep breaths and cough every 1-2 hours. Your doctor may order a device called an Incentive  Spirometer to help you take deep breaths.  When coughing or sneezing, hold a pillow firmly against your incision with both hands. This is called "splinting." Doing this helps protect your incision. It also decreases belly discomfort.  If you are being admitted to the hospital overnight, leave your suitcase in the car. After surgery it may be brought to your room.  In case of increased patient census, it may be necessary for you, the  patient, to continue your postoperative care in the Same Day Surgery department.  If you are being discharged the day of surgery, you will not be allowed to drive home. You will need a responsible individual to drive you home and stay with you for 24 hours after surgery.   If you are taking public transportation, you will need to have a responsible individual with you.  Please call the Pre-admissions Testing Dept. at [phone] if you have any questions about these instructions.  Surgery Visitation Policy:  Patients having surgery or a procedure may have two visitors.  Children under the age of 59 must have an adult with them who is not the patient.  Inpatient Visitation:    Visiting hours are 7 a.m. to 8 p.m. Up to four visitors are allowed at one time in a patient room. The visitors may rotate out with other people during the day.  One visitor age 45 or older may stay with the patient overnight and must be in the room by 8 p.m.   Merchandiser, retail to address health-related social needs:  [email or web address]

## 2024-03-07 ENCOUNTER — Ambulatory Visit
Admission: RE | Admit: 2024-03-07 | Discharge: 2024-03-07 | Disposition: A | Discharge status: Home / Self Care | Attending: Obstetrics and Gynecology | Admitting: Obstetrics and Gynecology

## 2024-03-07 ENCOUNTER — Telehealth: Payer: Self-pay | Admitting: Obstetrics and Gynecology

## 2024-03-07 ENCOUNTER — Other Ambulatory Visit: Payer: Self-pay

## 2024-03-07 ENCOUNTER — Encounter
Admission: RE | Disposition: A | Discharge status: Home / Self Care | Payer: Self-pay | Source: Home / Self Care | Attending: Obstetrics and Gynecology

## 2024-03-07 ENCOUNTER — Ambulatory Visit

## 2024-03-07 ENCOUNTER — Encounter: Payer: Self-pay | Admitting: Obstetrics and Gynecology

## 2024-03-07 ENCOUNTER — Ambulatory Visit: Payer: Self-pay | Admitting: Urgent Care

## 2024-03-07 DIAGNOSIS — Z85820 Personal history of malignant melanoma of skin: Secondary | ICD-10-CM | POA: Insufficient documentation

## 2024-03-07 DIAGNOSIS — I10 Essential (primary) hypertension: Secondary | ICD-10-CM | POA: Insufficient documentation

## 2024-03-07 DIAGNOSIS — Z87891 Personal history of nicotine dependence: Secondary | ICD-10-CM | POA: Diagnosis not present

## 2024-03-07 DIAGNOSIS — R569 Unspecified convulsions: Secondary | ICD-10-CM | POA: Diagnosis not present

## 2024-03-07 DIAGNOSIS — C519 Malignant neoplasm of vulva, unspecified: Secondary | ICD-10-CM

## 2024-03-07 DIAGNOSIS — C4499 Other specified malignant neoplasm of skin, unspecified: Secondary | ICD-10-CM

## 2024-03-07 HISTORY — PX: VULVA /PERINEUM BIOPSY: SHX319

## 2024-03-07 HISTORY — PX: EXAM UNDER ANESTHESIA, PELVIC: SHX7461

## 2024-03-07 SURGERY — EXAM UNDER ANESTHESIA, PELVIC
Anesthesia: General | Site: Vulva

## 2024-03-07 MED ORDER — BUPIVACAINE LIPOSOME 1.3 % IJ SUSP
INTRAMUSCULAR | Status: DC | PRN
  Administered 2024-03-07: 18 mL

## 2024-03-07 MED ORDER — PROPOFOL 10 MG/ML IV BOLUS
INTRAVENOUS | Status: AC
  Filled 2024-03-07: qty 20

## 2024-03-07 MED ORDER — KETOROLAC TROMETHAMINE 30 MG/ML IJ SOLN
INTRAMUSCULAR | Status: DC | PRN
  Administered 2024-03-07: 30 mg via INTRAVENOUS

## 2024-03-07 MED ORDER — CHLORHEXIDINE GLUCONATE 0.12 % MT SOLN
OROMUCOSAL | Status: AC
  Filled 2024-03-07: qty 15

## 2024-03-07 MED ORDER — LACTATED RINGERS IV SOLN: INTRAVENOUS | Status: DC

## 2024-03-07 MED ORDER — HEPARIN SODIUM (PORCINE) 5000 UNIT/ML IJ SOLN
5000.0000 [IU] | INTRAMUSCULAR | Status: AC
  Administered 2024-03-07: 5000 [IU] via SUBCUTANEOUS

## 2024-03-07 MED ORDER — BUPIVACAINE HCL (PF) 0.25 % IJ SOLN
INTRAMUSCULAR | Status: AC
  Filled 2024-03-07: qty 30

## 2024-03-07 MED ORDER — BUPIVACAINE LIPOSOME 1.3 % IJ SUSP: INTRAMUSCULAR | Status: DC | PRN

## 2024-03-07 MED ORDER — FENTANYL CITRATE (PF) 100 MCG/2ML IJ SOLN
INTRAMUSCULAR | Status: AC
  Filled 2024-03-07: qty 2

## 2024-03-07 MED ORDER — OXYCODONE HCL 5 MG/5ML PO SOLN: 5.0000 mg | Freq: Once | ORAL | Status: DC | PRN

## 2024-03-07 MED ORDER — FENTANYL CITRATE (PF) 100 MCG/2ML IJ SOLN
INTRAMUSCULAR | Status: DC | PRN
Start: 2024-03-07 — End: 2024-03-07
  Administered 2024-03-07 (×4): 25 ug via INTRAVENOUS

## 2024-03-07 MED ORDER — PROPOFOL 10 MG/ML IV BOLUS
INTRAVENOUS | Status: DC | PRN
  Administered 2024-03-07 (×2): 30 mg via INTRAVENOUS
  Administered 2024-03-07: 70 mg via INTRAVENOUS

## 2024-03-07 MED ORDER — SPIRONOLACTONE 25 MG PO TABS: 25.0000 mg | ORAL_TABLET | Freq: Every day | ORAL | Status: DC

## 2024-03-07 MED ORDER — LIDOCAINE HCL (CARDIAC) PF 100 MG/5ML IV SOSY
PREFILLED_SYRINGE | INTRAVENOUS | Status: DC | PRN
  Administered 2024-03-07: 40 mg via INTRAVENOUS

## 2024-03-07 MED ORDER — B COMPLEX VITAMINS PO CAPS: 1.0000 | ORAL_CAPSULE | Freq: Every day | ORAL | Status: DC

## 2024-03-07 MED ORDER — CLONAZEPAM 0.5 MG PO TABS: 0.5000 mg | ORAL_TABLET | Freq: Every day | ORAL | Status: DC

## 2024-03-07 MED ORDER — CHLORHEXIDINE GLUCONATE 0.12 % MT SOLN
15.0000 mL | Freq: Once | OROMUCOSAL | Status: AC
  Administered 2024-03-07: 15 mL via OROMUCOSAL

## 2024-03-07 MED ORDER — CEFAZOLIN SODIUM-DEXTROSE 2-4 GM/100ML-% IV SOLN
INTRAVENOUS | Status: AC
  Filled 2024-03-07: qty 100

## 2024-03-07 MED ORDER — BENAZEPRIL-HYDROCHLOROTHIAZIDE 20-12.5 MG PO TABS: 1.0000 | ORAL_TABLET | Freq: Every day | ORAL | Status: DC

## 2024-03-07 MED ORDER — MIDAZOLAM HCL 2 MG/2ML IJ SOLN
INTRAMUSCULAR | Status: DC | PRN
  Administered 2024-03-07: 2 mg via INTRAVENOUS

## 2024-03-07 MED ORDER — FENTANYL CITRATE (PF) 100 MCG/2ML IJ SOLN: 25.0000 ug | INTRAMUSCULAR | Status: DC | PRN

## 2024-03-07 MED ORDER — VITAMIN D 25 MCG (1000 UNIT) PO TABS: 1000.0000 [IU] | ORAL_TABLET | Freq: Every day | ORAL | Status: DC

## 2024-03-07 MED ORDER — MIDAZOLAM HCL 2 MG/2ML IJ SOLN
INTRAMUSCULAR | Status: AC
  Filled 2024-03-07: qty 2

## 2024-03-07 MED ORDER — ACETAMINOPHEN 10 MG/ML IV SOLN
1000.0000 mg | Freq: Once | INTRAVENOUS | Status: DC | PRN
Start: 2024-03-07 — End: 2024-03-07

## 2024-03-07 MED ORDER — ASPIRIN-ACETAMINOPHEN-CAFFEINE 250-250-65 MG PO TABS: 2.0000 | ORAL_TABLET | Freq: Four times a day (QID) | ORAL | Status: DC | PRN

## 2024-03-07 MED ORDER — ONDANSETRON HCL 4 MG/2ML IJ SOLN
INTRAMUSCULAR | Status: DC | PRN
  Administered 2024-03-07: 4 mg via INTRAVENOUS

## 2024-03-07 MED ORDER — ORAL CARE MOUTH RINSE: 15.0000 mL | Freq: Once | OROMUCOSAL | Status: AC

## 2024-03-07 MED ORDER — PROPOFOL 1000 MG/100ML IV EMUL
INTRAVENOUS | Status: AC
  Filled 2024-03-07: qty 100

## 2024-03-07 MED ORDER — PROPOFOL 500 MG/50ML IV EMUL
INTRAVENOUS | Status: DC | PRN
  Administered 2024-03-07: 125 ug/kg/min via INTRAVENOUS

## 2024-03-07 MED ORDER — KETOROLAC TROMETHAMINE 30 MG/ML IJ SOLN
INTRAMUSCULAR | Status: AC
  Filled 2024-03-07: qty 1

## 2024-03-07 MED ORDER — BUPIVACAINE LIPOSOME 1.3 % IJ SUSP
INTRAMUSCULAR | Status: AC
  Filled 2024-03-07: qty 10

## 2024-03-07 MED ORDER — AMPHETAMINE-DEXTROAMPHETAMINE 30 MG PO TABS: 30.0000 mg | ORAL_TABLET | Freq: Two times a day (BID) | ORAL | Status: DC

## 2024-03-07 MED ORDER — OXYCODONE HCL 5 MG PO TABS: 5.0000 mg | ORAL_TABLET | Freq: Once | ORAL | Status: DC | PRN

## 2024-03-07 MED ORDER — HEPARIN SODIUM (PORCINE) 5000 UNIT/ML IJ SOLN
INTRAMUSCULAR | Status: AC
  Filled 2024-03-07: qty 1

## 2024-03-07 MED ORDER — DEXMEDETOMIDINE HCL IN NACL 80 MCG/20ML IV SOLN
INTRAVENOUS | Status: DC | PRN
  Administered 2024-03-07 (×2): 4 ug via INTRAVENOUS

## 2024-03-07 MED ORDER — CEFAZOLIN SODIUM-DEXTROSE 2-4 GM/100ML-% IV SOLN
2.0000 g | INTRAVENOUS | Status: AC
  Administered 2024-03-07: 2 g via INTRAVENOUS

## 2024-03-07 MED ORDER — DROPERIDOL 2.5 MG/ML IJ SOLN: 0.6250 mg | Freq: Once | INTRAMUSCULAR | Status: DC | PRN

## 2024-03-07 SURGICAL SUPPLY — 22 items
BLADE SURG 15 STRL LF DISP TIS (BLADE) ×2 IMPLANT
BLADE SURG SZ10 CARB STEEL (BLADE) ×2 IMPLANT
DRAPE PERI LITHO V/GYN (MISCELLANEOUS) ×2 IMPLANT
DRAPE UNDER BUTTOCK W/FLU (DRAPES) ×2 IMPLANT
DRSG TELFA 3X8 NADH STRL (GAUZE/BANDAGES/DRESSINGS) ×2 IMPLANT
ELECT CAUTERY BLADE 6.4 (BLADE) IMPLANT
ELECTRODE REM PT RTRN 9FT ADLT (ELECTROSURGICAL) IMPLANT
GAUZE 4X4 16PLY ~~LOC~~+RFID DBL (SPONGE) ×4 IMPLANT
GLOVE BIO SURGEON STRL SZ 6.5 (GLOVE) ×2 IMPLANT
GLOVE INDICATOR 7.0 STRL GRN (GLOVE) ×2 IMPLANT
GOWN STRL REUS W/ TWL LRG LVL3 (GOWN DISPOSABLE) ×4 IMPLANT
MANIFOLD NEPTUNE II (INSTRUMENTS) ×2 IMPLANT
NDL HYPO 22X1.5 SAFETY MO (MISCELLANEOUS) ×2 IMPLANT
NEEDLE HYPO 22X1.5 SAFETY MO (MISCELLANEOUS) ×2 IMPLANT
NS IRRIG 500ML POUR BTL (IV SOLUTION) ×2 IMPLANT
PACK BASIN MINOR ARMC (MISCELLANEOUS) ×2 IMPLANT
PAD OB MATERNITY 11 LF (PERSONAL CARE ITEMS) ×2 IMPLANT
PAD PREP OB/GYN DISP 24X41 (PERSONAL CARE ITEMS) ×2 IMPLANT
SCRUB CHG 4% DYNA-HEX 4OZ (MISCELLANEOUS) ×2 IMPLANT
SURGILUBE 2OZ TUBE FLIPTOP (MISCELLANEOUS) ×2 IMPLANT
SYR CONTROL 10ML LL (SYRINGE) ×2 IMPLANT
TRAP FLUID SMOKE EVACUATOR (MISCELLANEOUS) ×2 IMPLANT

## 2024-03-07 NOTE — Op Note (Addendum)
  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 Mapping biopsies to determine extent of disease.  PROCEDURE IN DETAIL: FINDINGS: Vulvar lesion consistent with Paget's disease of the vulva and erythema at least 8 x 6 cm encompassing the right vulva; erythema of the left vulva but without the classic Paget's appearance.  PROCEDURE IN DETAIL: After informed consent was obtained, the patient was taken to the operating room where anesthesia was obtained without difficulty. The patient was positioned in the dorsal lithotomy position in [place] stirrups and her arms were carefully positioned and the usual precautions were taken.  She was prepped and draped in normal sterile fashion.  Time-out was performed.   The vulva was carefully inspected to demarcated the lesions. Fifteen mapping biopsies were performed - see photo. The biopsy sites were infiltrated with 19 cc of 0.25% Marcaine  mixed with Exparel .  The lesion was outlined with biopsies circumferentially on the right and diagnostic biopsies were performed on the left.   Hemostasis was obtained with cauterization and was observed.   The patient tolerated the procedure well.  Sponge, lap and needle counts were correct x2.  The patient was taken to recovery room in excellent condition.  There are additional very lateral and inferior and most medial biopsy sites on the patients right.     Antibiotics: as indicated  VTE prophylaxis:  Ordered    CONSTABLE WEBB ISIDOR, MD

## 2024-03-07 NOTE — Anesthesia Preprocedure Evaluation (Addendum)
 Anesthesia Evaluation  Patient identified by MRN, date of birth, ID band Patient awake    Reviewed: Allergy & Precautions, H&P , NPO status , Patient's Chart, lab work & pertinent test results  Airway Mallampati: III  TM Distance: >3 FB Neck ROM: full    Dental no notable dental hx.    Pulmonary former smoker   Pulmonary exam normal        Cardiovascular hypertension, Normal cardiovascular exam     Neuro/Psych Seizures -, Well Controlled,  PSYCHIATRIC DISORDERS  Depression       GI/Hepatic negative GI ROS, Neg liver ROS,,,  Endo/Other  negative endocrine ROS    Renal/GU negative Renal ROS  negative genitourinary   Musculoskeletal   Abdominal  (+) + obese  Peds  Hematology negative hematology ROS (+)   Anesthesia Other Findings Past Medical History: No date: ADHD (attention deficit hyperactivity disorder) No date: Complication of anesthesia     Comment:  a.) early emergence during neck surgery 09/12/2015: Current tobacco use No date: Depression No date: Extramammary Paget's disease     Comment:  Extramammary Paget's disease of vulva, melanoma of the               skin No date: Gallstones No date: Heart murmur No date: History of kidney stones No date: Hypertension No date: Migraine No date: Pneumonia 2010-2013: Seizures (HCC)     Comment:  Brain seizures 04/21/2021: Sprain of right ankle  Past Surgical History: No date: BACK SURGERY 06/16/2021: BREAST BIOPSY; Left     Comment:  papilloma-surgical excised on 07/17/2021 07/17/2021: BREAST LUMPECTOMY WITH RADIO FREQUENCY LOCALIZER; Left     Comment:  Procedure: BREAST LUMPECTOMY WITH RADIO FREQUENCY               LOCALIZER;  Surgeon: Tye Millet, DO;  Location: [facility]               ORS;  Service: General;  Laterality: Left; 2008: CERVICAL ABLATION 2002: CHOLECYSTECTOMY 2024: COLONOSCOPY 04/10/2017: COLONOSCOPY WITH PROPOFOL ; N/A     Comment:  Procedure:  COLONOSCOPY WITH PROPOFOL ;  Surgeon: Therisa Bi, MD;  Location: [facility] ENDOSCOPY;  Service:               Gastroenterology;  Laterality: N/A; 04/10/2017: ESOPHAGOGASTRODUODENOSCOPY (EGD) WITH PROPOFOL ; N/A     Comment:  Procedure: ESOPHAGOGASTRODUODENOSCOPY (EGD) WITH               PROPOFOL ;  Surgeon: Therisa Bi, MD;  Location: [facility]               ENDOSCOPY;  Service: Gastroenterology;  Laterality: N/A; 2005 & 2009: NECK SURGERY     Comment:  x2 No date: OTHER SURGICAL HISTORY     Comment:  Melanoma left leg 12/17/2022 No date: WISDOM TOOTH EXTRACTION  BMI    Body Mass Index: 32.77 kg/m      Reproductive/Obstetrics negative OB ROS                              Anesthesia Physical Anesthesia Plan  ASA: 2  Anesthesia Plan: General   Post-op Pain Management: Regional block*   Induction:   PONV Risk Score and Plan: Propofol  infusion and TIVA  Airway Management Planned: Natural Airway  Additional Equipment:   Intra-op Plan:   Post-operative Plan: Extubation in OR  Informed Consent: I have reviewed the  patients History and Physical, chart, labs and discussed the procedure including the risks, benefits and alternatives for the proposed anesthesia with the patient or authorized representative who has indicated his/her understanding and acceptance.     Dental Advisory Given  Plan Discussed with: CRNA and Surgeon  Anesthesia Plan Comments:          Anesthesia Quick Evaluation

## 2024-03-07 NOTE — Telephone Encounter (Signed)
 I called the patient to discuss what we did during her surgery. I answered the patient's questions accordingly. She is doing well.  Follow up in clinic for postoperative exam and management discussion. She will contact us  if she has any questions or concerns.  Cobe Viney Isidor Constable, MD

## 2024-03-07 NOTE — Transfer of Care (Signed)
 Immediate Anesthesia Transfer of Care Note  Patient: Teresa Rhodes  Procedure(s) Performed: ERASMO UNDER ANESTHESIA, PELVIC BIOPSY, VULVA (Bilateral)  Patient Location: PACU  Anesthesia Type:General  Level of Consciousness: awake, alert , and oriented  Airway & Oxygen Therapy: Patient Spontanous Breathing  Post-op Assessment: Report given to RN, Post -op Vital signs reviewed and stable, and Patient moving all extremities  Post vital signs: Reviewed and stable  Last Vitals:  Vitals Value Taken Time  BP 93/60 03/07/24 08:23  Temp 36.1 C 03/07/24 08:23  Pulse 80 03/07/24 08:24  Resp 21 03/07/24 08:24  SpO2 95 % 03/07/24 08:24  Vitals shown include unfiled device data.  Last Pain:  Vitals:   03/07/24 0628  TempSrc: Temporal  PainSc: 5          Complications: No notable events documented.

## 2024-03-07 NOTE — Interval H&P Note (Signed)
 History and Physical Interval Note:  03/07/2024 7:18 AM  Teresa Rhodes  has presented today for surgery, with the diagnosis of Paget's Disease.  The various methods of treatment have been discussed with the patient and family. After consideration of risks, benefits and other options for treatment, the patient has consented to  Procedure(s): EXAM UNDER ANESTHESIA, PELVIC (N/A) BIOPSY, VULVA (Bilateral) as a surgical intervention.  The patient's history has been reviewed, patient examined, no change in status, stable for surgery.  I have reviewed the patient's chart and labs.  Questions were answered to the patient's satisfaction.     Evonda Enge [employer]

## 2024-03-08 ENCOUNTER — Encounter: Payer: Self-pay | Admitting: Obstetrics and Gynecology

## 2024-03-08 LAB — SURGICAL PATHOLOGY

## 2024-03-08 NOTE — Anesthesia Postprocedure Evaluation (Signed)
 Anesthesia Post Note  Patient: Teresa Rhodes  Procedure(s) Performed: EXAM UNDER ANESTHESIA, PELVIC (Vulva) BIOPSY, VULVA (Bilateral: Vulva)  Patient location during evaluation: PACU Anesthesia Type: General Level of consciousness: awake and alert Pain management: pain level controlled Vital Signs Assessment: post-procedure vital signs reviewed and stable Respiratory status: spontaneous breathing, nonlabored ventilation and respiratory function stable Cardiovascular status: blood pressure returned to baseline and stable Postop Assessment: no apparent nausea or vomiting Anesthetic complications: no   No notable events documented.   Last Vitals:  Vitals:   03/07/24 0900 03/07/24 0923  BP: 119/69 (!) 145/78  Pulse: 69 73  Resp: 15 16  Temp: (!) 36.1 C (!) 36.3 C  SpO2: 98% 100%    Last Pain:  Vitals:   03/07/24 0923  TempSrc: Temporal  PainSc: 0-No pain                 [name]

## 2024-03-22 ENCOUNTER — Other Ambulatory Visit: Payer: Self-pay | Admitting: Internal Medicine

## 2024-03-22 DIAGNOSIS — R911 Solitary pulmonary nodule: Secondary | ICD-10-CM

## 2024-03-26 ENCOUNTER — Ambulatory Visit
Admission: RE | Admit: 2024-03-26 | Discharge: 2024-03-26 | Disposition: A | Discharge status: Home / Self Care | Source: Ambulatory Visit | Attending: Internal Medicine | Admitting: Internal Medicine

## 2024-03-26 DIAGNOSIS — R911 Solitary pulmonary nodule: Secondary | ICD-10-CM

## 2024-03-28 ENCOUNTER — Telehealth: Payer: Self-pay

## 2024-03-28 NOTE — Telephone Encounter (Signed)
 Her2 requested on specimen [phone].

## 2024-04-04 ENCOUNTER — Ambulatory Visit
Admission: RE | Admit: 2024-04-04 | Discharge: 2024-04-04 | Disposition: A | Discharge status: Home / Self Care | Source: Ambulatory Visit | Attending: Obstetrics and Gynecology | Admitting: Obstetrics and Gynecology

## 2024-04-04 DIAGNOSIS — N949 Unspecified condition associated with female genital organs and menstrual cycle: Secondary | ICD-10-CM | POA: Insufficient documentation

## 2024-04-10 ENCOUNTER — Ambulatory Visit: Payer: Self-pay | Admitting: Obstetrics and Gynecology

## 2024-04-11 ENCOUNTER — Inpatient Hospital Stay: Attending: Obstetrics and Gynecology | Admitting: Obstetrics and Gynecology

## 2024-04-11 ENCOUNTER — Encounter: Payer: Self-pay | Admitting: Obstetrics and Gynecology

## 2024-04-11 VITALS — BP 136/87 | HR 96 | Temp 98.7°F | Resp 20 | Wt 217.7 lb

## 2024-04-11 DIAGNOSIS — C4499 Other specified malignant neoplasm of skin, unspecified: Secondary | ICD-10-CM

## 2024-04-11 DIAGNOSIS — C519 Malignant neoplasm of vulva, unspecified: Secondary | ICD-10-CM | POA: Insufficient documentation

## 2024-04-11 DIAGNOSIS — Z7189 Other specified counseling: Secondary | ICD-10-CM | POA: Insufficient documentation

## 2024-04-11 NOTE — Patient Instructions (Signed)
 Your surgery will be scheduled May 09, 2024  You will have a pre-admission telephone visit prior to surgery. This visit will take around 40-45 minutes. Gyn oncology will see you 4-6 weeks following surgery for a post operative visit.     Vulvectomy, Wide Local Excision, or Laser Ablation: Surgical Information  The vulva is the external female genitalia, outside and around the vagina and pubic bone. It consists of:  The skin on, and in front of, the pubic bone.  The clitoris.  The labia majora (large lips) on the outside of the vagina.  The labia minora (small lips) around the opening of the vagina.  The opening and the skin in and around the vagina.   A vulvectomy or wide local excision of the vulva is the removal of the tissue of the vulva, which sometimes includes removal of the lymph nodes and tissue in the groin areas.  After surgery, a doctor will look at the tissue under a microscope. He or she will check it for abnormal cells.    Laser ablation is a procedure used to treat precancerous skin changes. The top layers of skin are removed with the laser. The wound is then covered with ointment. A scab will form over the area. The wound may take 3 to 6 week s to heal, depending on the size of the area treated. Good wound care may help the scar fade with time.  Most women go home 1 to 4 hours after surgery. You may need to stay overnight if you had lymph nodes removed. You will probably be able to return to your normal routine in 1 or 2 weeks.    Incision Care after surgery Do not douche or use tampons  Do not have sexual intercourse until your caregiver gives you permission.  After moving your bowels or urinating use a bottle filled with warm water  (peri-bottle) to clean the area. Gently wipe from front to back or pat the skin dry. You may use a blow dryer on the cool setting to help dry the skin.  Wear loose cotton underwear. Do not wear tight pants.  A sitz bath will help keep your  perineal area clean, reduce swelling, and provide comfort.  You may notice that your urine stream is at a different angle, and may spray. Using a plastic funnel may help decrease spray  Avoid exercises, such as running or biking for 3 weeks or as directed.  If your doctor has removed lymph nodes from your groin area, there may be an increase in swelling of your legs and feet. You can help prevent swelling by doing the following:    Elevate your legs while sitting or lying down.    Avoid standing in one place for long periods of time.    Avoid salt in your diet. It can cause fluid retention and swelling.    Do not cross your legs, especially when sitting.   Follow-up care is a key part of your treatment and safety. Be sure to make and go to all appointments, and call your doctor if you are having problems. It's also a good idea to know your test results and keep a list of the me dicines you take.

## 2024-04-11 NOTE — H&P (Addendum)
 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 G2P1 female who is seen in consultation from Bernarda Perfect, [place] for Vulvar Paget's disease.   03/07/2024 she underwent Exam under anesthesia and Mapping vulvar biopsies.   1. Vulva, biopsy, right 11 o'clock :      RARE ATYPICAL INTRAEPITHELIAL CELLS SUSPICIOUS FOR PAGET'S.      NEGATIVE FOR INVASIVE CARCINOMA.       2. Vulva, biopsy, 9 o'clock intro Itus :      BENIGN VULVAR SKIN.      NEGATIVE FOR PAGET'S.       3. Vulva, biopsy, 6 o'clock Midline Itus :      RARE ATYPICAL INTRAEPITHELIAL CELLS, FAVOR REACTIVE.      NEGATIVE FOR INVASIVE CARCINOMA.       4. Vulva, biopsy, right 6:30 o'clock 1 cm below Hymen Ring :      FOCAL PAGET'S DISEASE.      NEGATIVE FOR INVASIVE CARCINOMA.       5. Vulva, biopsy, 1 o'clock left vulvar biopsy :      RARE ATYPICAL INTRAEPITHELIAL CELLS, CANNOT RULE OUT PAGET'S.      NEGATIVE FOR INVASIVE CARCINOMA.       6. Vulva, biopsy, 3 o'clock left vulvar biopsy :      FOCAL PAGET'S DISEASE.      NEGATIVE FOR INVASIVE CARCINOMA.       7. Vulva, biopsy, 5 o'clock left vulvar biopsy :      BENIGN VULVAR SKIN.      NEGATIVE FOR PAGET'S.       8. Vulva, biopsy, right 9 o'clock lateral :      PAGET'S DISEASE.      NEGATIVE FOR INVASIVE CARCINOMA.       9. Vulva, biopsy, right 10 o'clock lateral :      BENIGN VULVAR SKIN.      NEGATIVE FOR PAGET'S.       10. Vulva, biopsy, right 7 o'clock lateral :      BENIGN VULVAR SKIN.      NEGATIVE FOR PAGET'S.       11. Vulva, biopsy, right 8 o'clock very lateral :      BENIGN VULVAR SKIN.      NEGATIVE FOR PAGET'S.       12. Vulva, biopsy, right 9:30 o'clock very lateral :      BENIGN VULVAR SKIN.      NEGATIVE FOR PAGET'S.       13. Vulva, biopsy, right lateral to intro Itus :      BENIGN VULVAR SKIN.      NEGATIVE FOR PAGET'S.        14. Vulva, biopsy, right 11:30 o'clock by clitorus :      PAGET'S DISEASE.      NEGATIVE FOR INVASIVE CARCINOMA.       15. Vulva, biopsy, right medial 11 o'clock :      PAGET'S DISEASE.      NEGATIVE FOR INVASIVE CARCINOMA.   By immunohistochemistry, the tumor cells are POSITIVE for Her2 (3+). She is having symptoms from Paget's with the most bothersome area being on the right vulva. The symptoms are interfering with her quality of life. She does not want to restart Aldara  even at a lower dose. She is still having difficulty with fatigue ever since she started on that therapy.   04/04/2024 Pelvic US   Uterus:Measurements: 6.8 x 3.5  x 3.9 cm = volume: 40 mL. No fibroids or other mass visualized.   Endometrium: Thickness: 2.8 mm.  No focal abnormality visualized.   Right ovary: Measurements: 2.4 x 1.3 x 2.1 cm = volume: 3.4 mL. Normal appearance/no adnexal mass. Focal echogenic area in the right ovary measuring 2 mm may represent calcification.   Left ovary: Measurements: 1.7 x 1.3 x 1.4 cm = volume: 1.6 mL. Normal appearance/no adnexal mass.   No abnormal free fluid. There are multiple cystic areas in the cervix measuring up to 6 x 6 x 7 mm, likely nabothian cysts.   IMPRESSION: 1. No acute abnormality. 2. Multiple cystic areas in the cervix measuring up to 7 mm, likely nabothian cysts. 3. Focal echogenic area in the right ovary measuring 2 mm may represent calcification.   Gynecologic History:  Teresa Rhodes is a 53 y.o. G2P1 female who is seen in consultation from Bernarda Perfect, [place] for Vulvar Paget's disease.   Patient states she has had itching for some time.  Uses astringent about once a month.  Saw derm who noticed white area R labia majora and started on clobetasol oint about a month ago. Pt using daily.  Colposcopy and biopsy 11/03/23 by Gyn PA.      Biopsy of right labia showed extramammary Paget's disease. Immunohistochemical stains show that the lesional  cells within the squamous mucosa are positive for CK7 while they are negative for HMB45, SOX10 immunostains and mucicarmine special stain. This immunoprofile is consistent with above interpretation.   Surgical Pathology: [facility] Outside case Review A. Labia, biopsy: extramammary paget disease Comment: Immunohistochemical stains were performed at the outside institution and are received for review and shoe the tumor cells are reactive with ck7, and non-reactive with HMB45. In addition, immunohistochemical stains are performed at [facility] and show that the cells of interest are predominantly positive for GATA3, negative for CDX2, and p63. Scattered atypical cells are positive for BRST-2. Very rare CK20 positive cells are identified. Overall the findings are most consistent with a primary extramammary Paget disease. Clinical correlation is recommended to completely exclude secondary Paget disease. This case was seen in consultation with Dr. Elaina, who agrees with the final interpretation.  - Dr Vinie, MD      Other relevant testing in the setting of Paget's Disease:  Colonoscopy: 9/24  with Dr. Celestina, neg per pt, unsure when due again per pt; 2018 with hemorrhoids and neg bx with Dr. Therisa; s/p EGD with bx. Unsure when colonoscopy due again due to possible FH colon cancer in her brother.  Mammogram: 11/21/23- Bi-Rads category 1: negative. Repeat in 12 months. 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  09/29/23 CT ABD/PELVIS WITH PCP: IMPRESSION: *No acute abnormality of the abdomen or pelvis. *Abundant residual fecal material right side colon and proximal transverse colon without obstruction or impaction. *Right adnexal cyst 3.2 x 2.3 cm. Recommend follow-up pelvic ultrasound in 6-12 months   She was previously discussed options including surgical excision, aldara , vs laser ablation. Aldara  was preferred given her age and multifocal diffuse nature of  her disease.   12/15/2023-01/26/2024  She completed 5 of planned 16 weeks of aldara  but tolerated poorly.   02/15/2024 EUA and mapping vulvar biopsies offered.     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 Skin, left medial lower leg, shave - Malignant melanoma Type: Superficial spreading , nevoid Clark Level: IV Breslow thickness: 0.9 mm Growth phase: Vertical, nonpigmented epithelioid cells  Mitoses: 0 per square millimeter Nuclear grade: 2/3 Tumor infiltrating lymphocytes: Non-brisk Regression:  Not identified Ulceration:  None Microscopic satellite: Not identified Vascular invasion:  Not identified Perineural invasion: Not identified Associated nevus: None Margins: Focally present in base of biopsy Pathologic (pT, AJCC 8th edition) staging: pT1b  Re-excision 5/24 with no residual and negative left groin node.  12/28/2023 Follow up visit with Dr. Tommas.   Problem List: Patient Active Problem List   Diagnosis Date Noted   Extramammary Paget's disease 11/23/2023   Melanoma (HCC) 11/10/2023   Adnexal cyst 11/03/2023   ASD (atrial septal defect), ostium secundum 04/19/2022   Vitamin D  deficiency 11/19/2020   GAD (generalized anxiety disorder) 06/08/2019   Seizure-like activity (HCC) 03/12/2019   Tingling 01/15/2019   Tingling in extremities 11/11/2018   Climacteric 06/21/2018   Chronic neck pain 05/25/2018   Chronic upper extremity pain 05/25/2018   Chronic bilateral low back pain with bilateral sciatica 05/25/2018   Chronic pain of both lower extremities 05/25/2018   Right foot drop 05/25/2018   Nausea & vomiting 04/07/2017   Seizure (HCC) 12/01/2016   Leg swelling 11/23/2016   Pinched nerve 11/23/2016   Right leg pain 11/23/2016   Difficulty sleeping 10/20/2016   Headache disorder 10/20/2016   Chronic idiopathic constipation 12/04/2015   Degeneration of intervertebral disc of lumbosacral region 09/12/2015   Headache, migraine 09/12/2015   Back pain 09/11/2015    Lumbosacral radiculopathy 07/22/2014   White matter abnormality on MRI of brain 06/12/2014   Foot drop 06/12/2014   Right arm weakness 06/12/2014   Past Medical History: Past Medical History:  Diagnosis Date   ADHD (attention deficit hyperactivity disorder)    Complication of anesthesia    a.) early emergence during neck surgery   Current tobacco use 09/12/2015   Depression    Extramammary Paget's disease    Extramammary Paget's disease of vulva, melanoma of the skin   Gallstones    Heart murmur    History of kidney stones    Hypertension    Migraine    Pneumonia    Seizures (HCC) 2010-2013   Brain seizures   Sprain of right ankle 04/21/2021   Past Surgical History: Past Surgical History:  Procedure Laterality Date   BACK SURGERY     BREAST BIOPSY Left 06/16/2021   papilloma-surgical excised on 07/17/2021   BREAST LUMPECTOMY WITH RADIO FREQUENCY LOCALIZER Left 07/17/2021   Procedure: BREAST LUMPECTOMY WITH RADIO FREQUENCY LOCALIZER;  Surgeon: Tye Millet, DO;  Location: [facility] ORS;  Service: General;  Laterality: Left;   CERVICAL ABLATION  2008   CHOLECYSTECTOMY  2002   COLONOSCOPY  2024   COLONOSCOPY WITH PROPOFOL  N/A 04/10/2017   Procedure: COLONOSCOPY WITH PROPOFOL ;  Surgeon: Therisa Bi, MD;  Location: [facility] ENDOSCOPY;  Service: Gastroenterology;  Laterality: N/A;   ESOPHAGOGASTRODUODENOSCOPY (EGD) WITH PROPOFOL  N/A 04/10/2017   Procedure: ESOPHAGOGASTRODUODENOSCOPY (EGD) WITH PROPOFOL ;  Surgeon: Therisa Bi, MD;  Location: [facility] ENDOSCOPY;  Service: Gastroenterology;  Laterality: N/A;   EXAM UNDER ANESTHESIA, PELVIC N/A 03/07/2024   Procedure: EXAM UNDER ANESTHESIA, PELVIC;  Surgeon: Elby Webb Loges, MD;  Location: [facility] ORS;  Service: Gynecology;  Laterality: N/A;   NECK SURGERY  2005 & 2009   x2   OTHER SURGICAL HISTORY     Melanoma left leg 12/17/2022   VULVA /PERINEUM BIOPSY Bilateral 03/07/2024   Procedure: BIOPSY, VULVA;  Surgeon: Elby Webb Loges,  MD;  Location: [facility] ORS;  Service: Gynecology;  Laterality: Bilateral;   WISDOM TOOTH EXTRACTION  OB History:  OB History  Gravida Para Term Preterm AB Living  2 1 1  1 1   SAB IAB Ectopic Multiple Live Births  1    1    # Outcome Date GA Lbr Len/2nd Weight Sex Type Anes PTL Lv  2 SAB           1 Term            Gynecologic History:  Last Pap: June 21, 2018  Results were: no abnormalities /neg HPV DNA   Family History: Family History  Problem Relation Age of Onset   Ulcerative colitis Mother    Ovarian cysts Mother    Kidney disease Father 16   Kidney disease Brother 7   Colon cancer Brother 32   Liver disease Maternal Grandfather    Colon polyps Neg Hx    Esophageal cancer Neg Hx    Kidney cancer Neg Hx    Bladder Cancer Neg Hx    Breast cancer Neg Hx    Social History: Social History   Socioeconomic History   Marital status: Married    Spouse name: Horen,Bryan G (Spouse)   Number of children: 3   Years of education: Not on file   Highest education level: Not on file  Occupational History   Occupation: Immunologist  Tobacco Use   Smoking status: Former    Current packs/day: 0.00    Average packs/day: 0.5 packs/day for 3.0 years (1.5 ttl pk-yrs)    Types: Cigarettes    Start date: 10/24/2011    Quit date: 10/24/2014    Years since quitting: 9.4   Smokeless tobacco: Never   Tobacco comments:    pt given handout  Vaping Use   Vaping status: Never Used  Substance and Sexual Activity   Alcohol use: Yes    Comment: Occasional   Drug use: No   Sexual activity: Yes    Birth control/protection: Post-menopausal, Surgical  Other Topics Concern   Not on file  Social History Narrative   Not on file   Social Drivers of Health   Financial Resource Strain: Low Risk  (07/22/2023)   Received from [facility]   Overall Financial Resource Strain (CARDIA)    Difficulty of Paying Living Expenses: Not hard at all  Food Insecurity: No Food  Insecurity (07/22/2023)   Received from [facility]   Hunger Vital Sign    Within the past 12 months, you worried that your food would run out before you got the money to buy more.: Never true    Within the past 12 months, the food you bought just didn't last and you didn't have money to get more.: Never true  Transportation Needs: No Transportation Needs (07/22/2023)   Received from [facility] - Transportation    In the past 12 months, has lack of transportation kept you from medical appointments or from getting medications?: No    Lack of Transportation (Non-Medical): No  Physical Activity: Not on file  Stress: Not on file  Social Connections: Not on file  Intimate Partner Violence: Not on file   Allergies: Allergies  Allergen Reactions   Methocarbamol Other (See Comments) and Dermatitis    Made skin red and hot and itchy Skin became red, hot, itchy.   Current Medications: Current Outpatient Medications  Medication Sig Dispense Refill   amphetamine -dextroamphetamine  (ADDERALL) 30 MG tablet Take 30 mg by mouth 2 (two) times daily.  aspirin -acetaminophen -caffeine  (EXCEDRIN  MIGRAINE) 250-250-65 MG tablet Take 2 tablets by mouth every 6 (six) hours as needed for headache.     b complex vitamins capsule Take 1 capsule by mouth daily.     benazepril -hydrochlorthiazide (LOTENSIN  HCT) 20-12.5 MG tablet Take 1 tablet by mouth daily.     cholecalciferol (VITAMIN D3) 25 MCG (1000 UNIT) tablet Take 1,000 Units by mouth daily.     clonazePAM  (KLONOPIN ) 0.5 MG tablet Take 0.5 mg by mouth at bedtime.     MAGNESIUM  PO Take 1 tablet by mouth daily.     imiquimod  (ALDARA ) 5 % cream Apply to right vulva nightly for three weeks then reduce to three times a week. For pagets. (Patient not taking: Reported on 04/11/2024) 30 each 0   spironolactone  (ALDACTONE ) 25 MG tablet Take 25 mg by mouth daily.     No current facility-administered medications for this  visit.    Review of Systems General:  fatigue,generalized weakness Skin: no complaints Eyes: no complaints HEENT: no complaints Breasts: no complaints Pulmonary: no complaints Cardiac: no complaints Gastrointestinal: abdominal pain, distention, decreased appetite, nausea, diarrhea Genitourinary/Sexual: vulvar irritation/discomfort/pain; pelvic pain Ob/Gyn: no complaints Musculoskeletal: back pain, leg swelling Hematology: no complaints Neurologic/Psych: numbness/tingling  Objective:  Physical Examination:  BP 136/87   Pulse 96   Temp 98.7 F (37.1 C)   Resp 20   Wt 217 lb 11.2 oz (98.7 kg)   SpO2 100%   BMI 32.15 kg/m    ECOG Performance Status: 1 - Symptomatic but completely ambulatory  GENERAL: Patient is a well appearing female in no acute distress HEENT:  Sclera clear. Anicteric LUNGS:  normal respiratory effort ABDOMEN:  Soft, nontender, nondistended. No masses or ascites or hernia EXTREMITIES:  No peripheral edema. Atraumatic. No cyanosis NEURO:  Nonfocal. Well oriented.  Appropriate affect.  Pelvic: exam chaperoned by RN Vulva: Diffuse erythema bilateral vulva. Healing biopsy sites.  White areas on right anterior labia. Mapping biopsies helped inform sites of disease.      Remainder of exam deferred from 02/15/2024 Vagina: normal no masses lesions or discharge Cervix: Located posteriorly and no lesions or cervical motion tenderness Uterus: Not enlarged and nontender RV: Not performed  Labs:  03/21/2024 Ref Range & Units   WBC (White Blood Cell Count) 4.1 - 10.2 10^3/uL 11.4 High   RBC (Red Blood Cell Count) 4.04 - 5.48 10^6/uL 4.80  Hemoglobin 12.0 - 15.0 gm/dL 86.1  Hematocrit 64.9 - 47.0 % 41.0  MCV (Mean Corpuscular Volume) 80.0 - 100.0 fl 85.4  MCH (Mean Corpuscular Hemoglobin) 27.0 - 31.2 pg 28.8  MCHC (Mean Corpuscular Hemoglobin Concentration) 32.0 - 36.0 gm/dL 66.2  Platelet Count 849 - 450 10^3/uL 305  RDW-CV (Red Cell Distribution  Width) 11.6 - 14.8 % 13.1  MPV (Mean Platelet Volume) 9.4 - 12.4 fl 11.4  Neutrophils 1.50 - 7.80 10^3/uL 8.01 High   Lymphocytes 1.00 - 3.60 10^3/uL 2.70  Monocytes 0.00 - 1.50 10^3/uL 0.57  Eosinophils 0.00 - 0.55 10^3/uL 0.08  Basophils 0.00 - 0.09 10^3/uL 0.04  Neutrophil % 32.0 - 70.0 % 70.1 High   Lymphocyte % 10.0 - 50.0 % 23.6  Monocyte % 4.0 - 13.0 % 5.0  Eosinophil % 1.0 - 5.0 % 0.7 Low   Basophil% 0.0 - 2.0 % 0.3  Immature Granulocyte % <=0.7 % 0.3  Immature Granulocyte Count <=0.06 10^3/L 0.04    03/21/2024   Glucose 70 - 110 mg/dL 892  Sodium 863 - 854 mmol/L 139  Potassium 3.6 -  5.1 mmol/L 4.1  Chloride 97 - 109 mmol/L 100  Carbon Dioxide (CO2) 22.0 - 32.0 mmol/L 30.6  Urea Nitrogen (BUN) 7 - 25 mg/dL 17  Creatinine 0.6 - 1.1 mg/dL 0.7  Glomerular Filtration Rate (eGFR) >60 mL/min/1.73sq m 105  Comment: CKD-EPI (2021) does not include patient's race in the calculation of eGFR.  Monitoring changes of plasma creatinine and eGFR over time is useful for monitoring kidney function.  Interpretive Ranges for eGFR (CKD-EPI 2021):  eGFR:       >60 mL/min/1.73 sq. m - Normal eGFR:       30-59 mL/min/1.73 sq. m - Moderately Decreased eGFR:       15-29 mL/min/1.73 sq. m  - Severely Decreased eGFR:       < 15 mL/min/1.73 sq. m  - Kidney Failure   Note: These eGFR calculations do not apply in acute situations when eGFR is changing rapidly or patients on dialysis.  Calcium 8.7 - 10.3 mg/dL 9.6  AST 8 - 39 U/L 15  ALT 5 - 38 U/L 16  Alk Phos (alkaline Phosphatase) 34 - 104 U/L 84  Albumin 3.5 - 4.8 g/dL 4.5  Bilirubin, Total 0.3 - 1.2 mg/dL 0.3  Protein, Total 6.1 - 7.9 g/dL 7.2  A/G Ratio 1.0 - 5.0 gm/dL 1.7   Thyroid  Stimulating Hormone (TSH) 0.450 - 5.330 uIU/mL 2.684    Radiology:  09/29/2023 CLINICAL DATA:  Abdominal pain located under breast region and within the belly button distribution. Some nausea. No fever.   EXAM: CT ABDOMEN  AND PELVIS WITH CONTRAST   TECHNIQUE: Multidetector CT imaging of the abdomen and pelvis was performed using the standard protocol following bolus administration of intravenous contrast.   RADIATION DOSE REDUCTION: This exam was performed according to the departmental dose-optimization program which includes automated exposure control, adjustment of the mA and/or kV according to patient size and/or use of iterative reconstruction technique.   CONTRAST:  75mL ISOVUE -370 IOPAMIDOL  (ISOVUE -370) INJECTION 76%   COMPARISON:  CT abdomen and pelvis February 19, 2022   FINDINGS: Lower chest: No acute abnormality.   Hepatobiliary: No focal liver abnormality is seen. No gallstones, prior cholecystectomy. No biliary dilatation.   Pancreas: Unremarkable. No pancreatic ductal dilatation or surrounding inflammatory changes.   Spleen: Normal in size without focal abnormality.   Adrenals/Urinary Tract: Adrenal glands are unremarkable. Kidneys are normal, without renal calculi, focal lesion, or hydronephrosis. Bladder is unremarkable.   Stomach/Bowel: Stomach is within normal limits. Appendix appears normal. No evidence of bowel wall thickening, distention, or inflammatory changes. Abundant residual fecal material right side: And proximal transverse colon without obstruction or impaction. No diverticulitis. No appendicitis.   Vascular/Lymphatic: No significant vascular findings are present. No enlarged abdominal or pelvic lymph nodes.   Reproductive: Uterus and bilateral adnexa are unremarkable. Right adnexal cyst 3.2 x 2.3 cm. Recommend follow-up pelvic ultrasound in 6-12 months.   Other: No abdominal wall hernia or abnormality. No abdominopelvic ascites.   Musculoskeletal: No acute or significant osseous findings. Postsurgical changes L4-L5.   IMPRESSION: *No acute abnormality of the abdomen or pelvis. *Abundant residual fecal material right side colon and proximal transverse colon  without obstruction or impaction. *Right adnexal cyst 3.2 x 2.3 cm. Recommend follow-up pelvic ultrasound in 6-12 months.     Assessment:  Teresa Rhodes is a 53 y.o. P60 female with vulvar irritation and biopsy of right labia 4/25 shows extramammary Paget's disease.  No underlying mass concerning for invasive vulvar cancer. Biopsy re-reviewed at [facility] confirming  diagnosis of paget's. Started topical imiquimod . Tolerating poorly and persistent disease. Mapping vulvar biopsies 03/07/2024 demonstrate extensive disease. The areas that bother her most are the right vulvar and left vulvar sites.   Right adnexal cyst 3.2 x 2.3 cm, resolved, 2 mm echogenic area uncertain etiology  Melanoma of left leg with negative groin node 5/24.  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 No adjuvant therapy.  Will continue follow up with Dermatology and team at [facility]  History of endometrial ablation, perimenopausal. BMI 31 Plan:   Problem List Items Addressed This Visit       Musculoskeletal and Integument   Extramammary Paget's disease - Primary   Other Visit Diagnoses       Counseling and coordination of care            Discussion of alternatives today including continuing aldara  at 2.5% vs trastuzumab given HER2 overexpression vs surgery with WLE of most bothersome areas vs MOH's surgery. Based on her exam and symptoms we will focus on the most bothersome sites. She is interested in proceeding with EUA and vulvar WLE. She is interested in medical oncology referral to discuss the use of trastuzumab. They understand that trastuzumab is not FDA approved for this indication but there have been case reports regarding the successful use of this therapy to treat vulvar Paget's disease. See case report below.   Surgery is scheduled for 05/09/2024  Risks were discussed in detail. These include infection, anesthesia, bleeding, transfusion,  wound separation, medical issues (blood clots, stroke, heart attack, fluid in the lungs, pneumonia, abnormal heart rhythm, death), possible exploratory surgery with larger incision, allergic reaction, injury to adjacent organs (bowel, bladder, blood vessels, nerves, ureters, uterus) which is unlikely, risk of persistent/recurrent Paget's disease.   Follow up in clinic in 4-6 weeks after surgery for reassessment.   Does the patient have a personal history of VTE?:  No   Does the patient have a family history of VTE?:  No   Does the patient have cancer?:  She has Paget's and a prior h/o Melanoma. Reviewed with Dr. Caye and Paget's not considered cancer for the purpose of VTE algorithm.   <30 minutes, or Procedure plus anesthesia time <45 minutes?:  No   Risk Points::  3 Body mass index is 32.15 kg/m. 2 Age 58-60 yrs old   1    Risk Category:  Moderate High Risk  RECOMMENDED PROPHYLAXIS:  Sequential compression devices OR compression stockings OR one preoperative dose unfractionated heparin  followed by in hospital postoperative dosing with low molecular weight heparin  or unfractionated heparin    Case Reports Gynecol Oncol . 2008 Dec;111(3):568-71. doi: [email or web address]/[email or web address]. Epub 2008 Feb 5. HER-2/neu targeting for recurrent vulvar Paget's disease A case report and literature review Ellamae Carwin 1, Dorn GORMAN Hane, Amy Meredeth Katherne Melanee Chesley [employer] PMID: [id number] DOI: [email or web address] Abstract Background: The treatment of Paget's disease of the vulva particularly for recurrences can be challenging. Overexpression of the HER-2/neu protein has been found in about 30% of vulvar Paget's cases therefore presenting a potential therapeutic target.  Case: We report the case of a 53 year old patient with persistent Paget's disease of the vulva despite eight surgical excisions over a 15-year period. Immunohistochemistry demonstrated overexpression of the  HER-2/neu protein in the vulva resection specimen. Treatment with Trastuzumab resulted in a significant regression of her disease and resolution of symptoms.  Conclusion: Based on our case report, therapeutic targeting of HER-2/neu for  patients with Paget's disease of the vulva using for example Trastuzumab is a potentially effective, alternative approach, and warrants further investigation.  The patient's diagnosis, an outline of the further diagnostic and laboratory studies which will be required, the recommendation, and alternatives were discussed.  All questions were answered to the patient's satisfaction.  Over 60 minutes were spent with the patient/family today; 50% was spent in education, counseling and coordination of care for Paget's disease.    Kin Galbraith Isidor Constable, MD

## 2024-04-11 NOTE — Progress Notes (Addendum)
 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 G2P1 female who is seen in consultation from Bernarda Perfect, [place] for Vulvar Paget's disease.   03/07/2024 she underwent Exam under anesthesia and Mapping vulvar biopsies.   1. Vulva, biopsy, right 11 o'clock :      RARE ATYPICAL INTRAEPITHELIAL CELLS SUSPICIOUS FOR PAGET'S.      NEGATIVE FOR INVASIVE CARCINOMA.       2. Vulva, biopsy, 9 o'clock intro Itus :      BENIGN VULVAR SKIN.      NEGATIVE FOR PAGET'S.       3. Vulva, biopsy, 6 o'clock Midline Itus :      RARE ATYPICAL INTRAEPITHELIAL CELLS, FAVOR REACTIVE.      NEGATIVE FOR INVASIVE CARCINOMA.       4. Vulva, biopsy, right 6:30 o'clock 1 cm below Hymen Ring :      FOCAL PAGET'S DISEASE.      NEGATIVE FOR INVASIVE CARCINOMA.       5. Vulva, biopsy, 1 o'clock left vulvar biopsy :      RARE ATYPICAL INTRAEPITHELIAL CELLS, CANNOT RULE OUT PAGET'S.      NEGATIVE FOR INVASIVE CARCINOMA.       6. Vulva, biopsy, 3 o'clock left vulvar biopsy :      FOCAL PAGET'S DISEASE.      NEGATIVE FOR INVASIVE CARCINOMA.       7. Vulva, biopsy, 5 o'clock left vulvar biopsy :      BENIGN VULVAR SKIN.      NEGATIVE FOR PAGET'S.       8. Vulva, biopsy, right 9 o'clock lateral :      PAGET'S DISEASE.      NEGATIVE FOR INVASIVE CARCINOMA.       9. Vulva, biopsy, right 10 o'clock lateral :      BENIGN VULVAR SKIN.      NEGATIVE FOR PAGET'S.       10. Vulva, biopsy, right 7 o'clock lateral :      BENIGN VULVAR SKIN.      NEGATIVE FOR PAGET'S.       11. Vulva, biopsy, right 8 o'clock very lateral :      BENIGN VULVAR SKIN.      NEGATIVE FOR PAGET'S.       12. Vulva, biopsy, right 9:30 o'clock very lateral :      BENIGN VULVAR SKIN.      NEGATIVE FOR PAGET'S.       13. Vulva, biopsy, right lateral to intro Itus :      BENIGN VULVAR SKIN.      NEGATIVE FOR  PAGET'S.       14. Vulva, biopsy, right 11:30 o'clock by clitorus :      PAGET'S DISEASE.      NEGATIVE FOR INVASIVE CARCINOMA.       15. Vulva, biopsy, right medial 11 o'clock :      PAGET'S DISEASE.      NEGATIVE FOR INVASIVE CARCINOMA.   By immunohistochemistry, the tumor cells are POSITIVE for Her2 (3+). She is having symptoms from Paget's with the most bothersome area being on the right vulva. The symptoms are interfering with her quality of life. She does not want to restart Aldara  even at a lower dose. She is still having difficulty with fatigue ever since she started on that therapy.   04/04/2024 Pelvic US   Uterus:Measurements: 6.8  x 3.5 x 3.9 cm = volume: 40 mL. No fibroids or other mass visualized.   Endometrium: Thickness: 2.8 mm.  No focal abnormality visualized.   Right ovary: Measurements: 2.4 x 1.3 x 2.1 cm = volume: 3.4 mL. Normal appearance/no adnexal mass. Focal echogenic area in the right ovary measuring 2 mm may represent calcification.   Left ovary: Measurements: 1.7 x 1.3 x 1.4 cm = volume: 1.6 mL. Normal appearance/no adnexal mass.   No abnormal free fluid. There are multiple cystic areas in the cervix measuring up to 6 x 6 x 7 mm, likely nabothian cysts.   IMPRESSION: 1. No acute abnormality. 2. Multiple cystic areas in the cervix measuring up to 7 mm, likely nabothian cysts. 3. Focal echogenic area in the right ovary measuring 2 mm may represent calcification.   Gynecologic History:  JA PISTOLE is a 53 y.o. G2P1 female who is seen in consultation from Bernarda Perfect, [place] for Vulvar Paget's disease.   Patient states she has had itching for some time.  Uses astringent about once a month.  Saw derm who noticed white area R labia majora and started on clobetasol oint about a month ago. Pt using daily.  Colposcopy and biopsy 11/03/23 by Gyn PA.      Biopsy of right labia showed extramammary Paget's disease. Immunohistochemical stains show that  the lesional cells within the squamous mucosa are positive for CK7 while they are negative for HMB45, SOX10 immunostains and mucicarmine special stain. This immunoprofile is consistent with above interpretation.   Surgical Pathology: [facility] Outside case Review A. Labia, biopsy: extramammary paget disease Comment: Immunohistochemical stains were performed at the outside institution and are received for review and shoe the tumor cells are reactive with ck7, and non-reactive with HMB45. In addition, immunohistochemical stains are performed at [facility] and show that the cells of interest are predominantly positive for GATA3, negative for CDX2, and p63. Scattered atypical cells are positive for BRST-2. Very rare CK20 positive cells are identified. Overall the findings are most consistent with a primary extramammary Paget disease. Clinical correlation is recommended to completely exclude secondary Paget disease. This case was seen in consultation with Dr. Elaina, who agrees with the final interpretation.  - Dr Vinie, MD      Other relevant testing in the setting of Paget's Disease:  Colonoscopy: 9/24  with Dr. Celestina, neg per pt, unsure when due again per pt; 2018 with hemorrhoids and neg bx with Dr. Therisa; s/p EGD with bx. Unsure when colonoscopy due again due to possible FH colon cancer in her brother.  Mammogram: 11/21/23- Bi-Rads category 1: negative. Repeat in 12 months. 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  09/29/23 CT ABD/PELVIS WITH PCP: IMPRESSION: *No acute abnormality of the abdomen or pelvis. *Abundant residual fecal material right side colon and proximal transverse colon without obstruction or impaction. *Right adnexal cyst 3.2 x 2.3 cm. Recommend follow-up pelvic ultrasound in 6-12 months   She was previously discussed options including surgical excision, aldara , vs laser ablation. Aldara  was preferred given her age and multifocal  diffuse nature of her disease.   12/15/2023-01/26/2024  She completed 5 of planned 16 weeks of aldara  but tolerated poorly.   02/15/2024 EUA and mapping vulvar biopsies offered.     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 Skin, left medial lower leg, shave - Malignant melanoma Type: Superficial spreading , nevoid Clark Level: IV Breslow thickness: 0.9 mm Growth phase: Vertical, nonpigmented  epithelioid cells Mitoses: 0 per square millimeter Nuclear grade: 2/3 Tumor infiltrating lymphocytes: Non-brisk Regression:  Not identified Ulceration:  None Microscopic satellite: Not identified Vascular invasion:  Not identified Perineural invasion: Not identified Associated nevus: None Margins: Focally present in base of biopsy Pathologic (pT, AJCC 8th edition) staging: pT1b  Re-excision 5/24 with no residual and negative left groin node.  12/28/2023 Follow up visit with Dr. Tommas.   Problem List: Patient Active Problem List   Diagnosis Date Noted   Extramammary Paget's disease 11/23/2023   Melanoma (HCC) 11/10/2023   Adnexal cyst 11/03/2023   ASD (atrial septal defect), ostium secundum 04/19/2022   Vitamin D  deficiency 11/19/2020   GAD (generalized anxiety disorder) 06/08/2019   Seizure-like activity (HCC) 03/12/2019   Tingling 01/15/2019   Tingling in extremities 11/11/2018   Climacteric 06/21/2018   Chronic neck pain 05/25/2018   Chronic upper extremity pain 05/25/2018   Chronic bilateral low back pain with bilateral sciatica 05/25/2018   Chronic pain of both lower extremities 05/25/2018   Right foot drop 05/25/2018   Nausea & vomiting 04/07/2017   Seizure (HCC) 12/01/2016   Leg swelling 11/23/2016   Pinched nerve 11/23/2016   Right leg pain 11/23/2016   Difficulty sleeping 10/20/2016   Headache disorder 10/20/2016   Chronic idiopathic constipation 12/04/2015   Degeneration of intervertebral disc of lumbosacral region 09/12/2015   Headache, migraine 09/12/2015   Back  pain 09/11/2015   Lumbosacral radiculopathy 07/22/2014   White matter abnormality on MRI of brain 06/12/2014   Foot drop 06/12/2014   Right arm weakness 06/12/2014   Past Medical History: Past Medical History:  Diagnosis Date   ADHD (attention deficit hyperactivity disorder)    Complication of anesthesia    a.) early emergence during neck surgery   Current tobacco use 09/12/2015   Depression    Extramammary Paget's disease    Extramammary Paget's disease of vulva, melanoma of the skin   Gallstones    Heart murmur    History of kidney stones    Hypertension    Migraine    Pneumonia    Seizures (HCC) 2010-2013   Brain seizures   Sprain of right ankle 04/21/2021   Past Surgical History: Past Surgical History:  Procedure Laterality Date   BACK SURGERY     BREAST BIOPSY Left 06/16/2021   papilloma-surgical excised on 07/17/2021   BREAST LUMPECTOMY WITH RADIO FREQUENCY LOCALIZER Left 07/17/2021   Procedure: BREAST LUMPECTOMY WITH RADIO FREQUENCY LOCALIZER;  Surgeon: Tye Millet, DO;  Location: [facility] ORS;  Service: General;  Laterality: Left;   CERVICAL ABLATION  2008   CHOLECYSTECTOMY  2002   COLONOSCOPY  2024   COLONOSCOPY WITH PROPOFOL  N/A 04/10/2017   Procedure: COLONOSCOPY WITH PROPOFOL ;  Surgeon: Therisa Bi, MD;  Location: [facility] ENDOSCOPY;  Service: Gastroenterology;  Laterality: N/A;   ESOPHAGOGASTRODUODENOSCOPY (EGD) WITH PROPOFOL  N/A 04/10/2017   Procedure: ESOPHAGOGASTRODUODENOSCOPY (EGD) WITH PROPOFOL ;  Surgeon: Therisa Bi, MD;  Location: [facility] ENDOSCOPY;  Service: Gastroenterology;  Laterality: N/A;   EXAM UNDER ANESTHESIA, PELVIC N/A 03/07/2024   Procedure: EXAM UNDER ANESTHESIA, PELVIC;  Surgeon: Elby Webb Loges, MD;  Location: [facility] ORS;  Service: Gynecology;  Laterality: N/A;   NECK SURGERY  2005 & 2009   x2   OTHER SURGICAL HISTORY     Melanoma left leg 12/17/2022   VULVA /PERINEUM BIOPSY Bilateral 03/07/2024   Procedure: BIOPSY, VULVA;  Surgeon: Elby Webb Loges, MD;  Location: [facility] ORS;  Service: Gynecology;  Laterality: Bilateral;   WISDOM TOOTH  EXTRACTION     OB History:  OB History  Gravida Para Term Preterm AB Living  2 1 1  1 1   SAB IAB Ectopic Multiple Live Births  1    1    # Outcome Date GA Lbr Len/2nd Weight Sex Type Anes PTL Lv  2 SAB           1 Term            Gynecologic History:  Last Pap: June 21, 2018  Results were: no abnormalities /neg HPV DNA   Family History: Family History  Problem Relation Age of Onset   Ulcerative colitis Mother    Ovarian cysts Mother    Kidney disease Father 36   Kidney disease Brother 45   Colon cancer Brother 29   Liver disease Maternal Grandfather    Colon polyps Neg Hx    Esophageal cancer Neg Hx    Kidney cancer Neg Hx    Bladder Cancer Neg Hx    Breast cancer Neg Hx    Social History: Social History   Socioeconomic History   Marital status: Married    Spouse name: Jacobowitz,Bryan G (Spouse)   Number of children: 3   Years of education: Not on file   Highest education level: Not on file  Occupational History   Occupation: Immunologist  Tobacco Use   Smoking status: Former    Current packs/day: 0.00    Average packs/day: 0.5 packs/day for 3.0 years (1.5 ttl pk-yrs)    Types: Cigarettes    Start date: 10/24/2011    Quit date: 10/24/2014    Years since quitting: 9.4   Smokeless tobacco: Never   Tobacco comments:    pt given handout  Vaping Use   Vaping status: Never Used  Substance and Sexual Activity   Alcohol use: Yes    Comment: Occasional   Drug use: No   Sexual activity: Yes    Birth control/protection: Post-menopausal, Surgical  Other Topics Concern   Not on file  Social History Narrative   Not on file   Social Drivers of Health   Financial Resource Strain: Low Risk  (07/22/2023)   Received from [facility]   Overall Financial Resource Strain (CARDIA)    Difficulty of Paying Living Expenses: Not hard at all  Food  Insecurity: No Food Insecurity (07/22/2023)   Received from [facility]   Hunger Vital Sign    Within the past 12 months, you worried that your food would run out before you got the money to buy more.: Never true    Within the past 12 months, the food you bought just didn't last and you didn't have money to get more.: Never true  Transportation Needs: No Transportation Needs (07/22/2023)   Received from [facility] - Transportation    In the past 12 months, has lack of transportation kept you from medical appointments or from getting medications?: No    Lack of Transportation (Non-Medical): No  Physical Activity: Not on file  Stress: Not on file  Social Connections: Not on file  Intimate Partner Violence: Not on file   Allergies: Allergies  Allergen Reactions   Methocarbamol Other (See Comments) and Dermatitis    Made skin red and hot and itchy Skin became red, hot, itchy.   Current Medications: Current Outpatient Medications  Medication Sig Dispense Refill   amphetamine -dextroamphetamine  (ADDERALL) 30 MG tablet Take 30 mg by mouth 2 (two)  times daily.     aspirin -acetaminophen -caffeine  (EXCEDRIN  MIGRAINE) 250-250-65 MG tablet Take 2 tablets by mouth every 6 (six) hours as needed for headache.     b complex vitamins capsule Take 1 capsule by mouth daily.     benazepril -hydrochlorthiazide (LOTENSIN  HCT) 20-12.5 MG tablet Take 1 tablet by mouth daily.     cholecalciferol (VITAMIN D3) 25 MCG (1000 UNIT) tablet Take 1,000 Units by mouth daily.     clonazePAM  (KLONOPIN ) 0.5 MG tablet Take 0.5 mg by mouth at bedtime.     MAGNESIUM  PO Take 1 tablet by mouth daily.     imiquimod  (ALDARA ) 5 % cream Apply to right vulva nightly for three weeks then reduce to three times a week. For pagets. (Patient not taking: Reported on 04/11/2024) 30 each 0   spironolactone  (ALDACTONE ) 25 MG tablet Take 25 mg by mouth daily.     No current facility-administered  medications for this visit.    Review of Systems General:  fatigue,generalized weakness Skin: no complaints Eyes: no complaints HEENT: no complaints Breasts: no complaints Pulmonary: no complaints Cardiac: no complaints Gastrointestinal: abdominal pain, distention, decreased appetite, nausea, diarrhea Genitourinary/Sexual: vulvar irritation/discomfort/pain; pelvic pain Ob/Gyn: no complaints Musculoskeletal: back pain, leg swelling Hematology: no complaints Neurologic/Psych: numbness/tingling  Objective:  Physical Examination:  BP 136/87   Pulse 96   Temp 98.7 F (37.1 C)   Resp 20   Wt 217 lb 11.2 oz (98.7 kg)   SpO2 100%   BMI 32.15 kg/m    ECOG Performance Status: 1 - Symptomatic but completely ambulatory  GENERAL: Patient is a well appearing female in no acute distress HEENT:  Sclera clear. Anicteric LUNGS:  normal respiratory effort ABDOMEN:  Soft, nontender, nondistended. No masses or ascites or hernia EXTREMITIES:  No peripheral edema. Atraumatic. No cyanosis NEURO:  Nonfocal. Well oriented.  Appropriate affect.  Pelvic: exam chaperoned by RN Vulva: Diffuse erythema bilateral vulva. Healing biopsy sites.  White areas on right anterior labia. Mapping biopsies helped inform sites of disease.      Remainder of exam deferred from 02/15/2024 Vagina: normal no masses lesions or discharge Cervix: Located posteriorly and no lesions or cervical motion tenderness Uterus: Not enlarged and nontender RV: Not performed  Labs:  03/21/2024 Ref Range & Units   WBC (White Blood Cell Count) 4.1 - 10.2 10^3/uL 11.4 High   RBC (Red Blood Cell Count) 4.04 - 5.48 10^6/uL 4.80  Hemoglobin 12.0 - 15.0 gm/dL 86.1  Hematocrit 64.9 - 47.0 % 41.0  MCV (Mean Corpuscular Volume) 80.0 - 100.0 fl 85.4  MCH (Mean Corpuscular Hemoglobin) 27.0 - 31.2 pg 28.8  MCHC (Mean Corpuscular Hemoglobin Concentration) 32.0 - 36.0 gm/dL 66.2  Platelet Count 849 - 450 10^3/uL 305  RDW-CV (Red  Cell Distribution Width) 11.6 - 14.8 % 13.1  MPV (Mean Platelet Volume) 9.4 - 12.4 fl 11.4  Neutrophils 1.50 - 7.80 10^3/uL 8.01 High   Lymphocytes 1.00 - 3.60 10^3/uL 2.70  Monocytes 0.00 - 1.50 10^3/uL 0.57  Eosinophils 0.00 - 0.55 10^3/uL 0.08  Basophils 0.00 - 0.09 10^3/uL 0.04  Neutrophil % 32.0 - 70.0 % 70.1 High   Lymphocyte % 10.0 - 50.0 % 23.6  Monocyte % 4.0 - 13.0 % 5.0  Eosinophil % 1.0 - 5.0 % 0.7 Low   Basophil% 0.0 - 2.0 % 0.3  Immature Granulocyte % <=0.7 % 0.3  Immature Granulocyte Count <=0.06 10^3/L 0.04    03/21/2024   Glucose 70 - 110 mg/dL 892  Sodium 863 - 854  mmol/L 139  Potassium 3.6 - 5.1 mmol/L 4.1  Chloride 97 - 109 mmol/L 100  Carbon Dioxide (CO2) 22.0 - 32.0 mmol/L 30.6  Urea Nitrogen (BUN) 7 - 25 mg/dL 17  Creatinine 0.6 - 1.1 mg/dL 0.7  Glomerular Filtration Rate (eGFR) >60 mL/min/1.73sq m 105  Comment: CKD-EPI (2021) does not include patient's race in the calculation of eGFR.  Monitoring changes of plasma creatinine and eGFR over time is useful for monitoring kidney function.  Interpretive Ranges for eGFR (CKD-EPI 2021):  eGFR:       >60 mL/min/1.73 sq. m - Normal eGFR:       30-59 mL/min/1.73 sq. m - Moderately Decreased eGFR:       15-29 mL/min/1.73 sq. m  - Severely Decreased eGFR:       < 15 mL/min/1.73 sq. m  - Kidney Failure   Note: These eGFR calculations do not apply in acute situations when eGFR is changing rapidly or patients on dialysis.  Calcium 8.7 - 10.3 mg/dL 9.6  AST 8 - 39 U/L 15  ALT 5 - 38 U/L 16  Alk Phos (alkaline Phosphatase) 34 - 104 U/L 84  Albumin 3.5 - 4.8 g/dL 4.5  Bilirubin, Total 0.3 - 1.2 mg/dL 0.3  Protein, Total 6.1 - 7.9 g/dL 7.2  A/G Ratio 1.0 - 5.0 gm/dL 1.7   Thyroid  Stimulating Hormone (TSH) 0.450 - 5.330 uIU/mL 2.684    Radiology:  09/29/2023 CLINICAL DATA:  Abdominal pain located under breast region and within the belly button distribution. Some nausea. No fever.    EXAM: CT ABDOMEN AND PELVIS WITH CONTRAST   TECHNIQUE: Multidetector CT imaging of the abdomen and pelvis was performed using the standard protocol following bolus administration of intravenous contrast.   RADIATION DOSE REDUCTION: This exam was performed according to the departmental dose-optimization program which includes automated exposure control, adjustment of the mA and/or kV according to patient size and/or use of iterative reconstruction technique.   CONTRAST:  75mL ISOVUE -370 IOPAMIDOL  (ISOVUE -370) INJECTION 76%   COMPARISON:  CT abdomen and pelvis February 19, 2022   FINDINGS: Lower chest: No acute abnormality.   Hepatobiliary: No focal liver abnormality is seen. No gallstones, prior cholecystectomy. No biliary dilatation.   Pancreas: Unremarkable. No pancreatic ductal dilatation or surrounding inflammatory changes.   Spleen: Normal in size without focal abnormality.   Adrenals/Urinary Tract: Adrenal glands are unremarkable. Kidneys are normal, without renal calculi, focal lesion, or hydronephrosis. Bladder is unremarkable.   Stomach/Bowel: Stomach is within normal limits. Appendix appears normal. No evidence of bowel wall thickening, distention, or inflammatory changes. Abundant residual fecal material right side: And proximal transverse colon without obstruction or impaction. No diverticulitis. No appendicitis.   Vascular/Lymphatic: No significant vascular findings are present. No enlarged abdominal or pelvic lymph nodes.   Reproductive: Uterus and bilateral adnexa are unremarkable. Right adnexal cyst 3.2 x 2.3 cm. Recommend follow-up pelvic ultrasound in 6-12 months.   Other: No abdominal wall hernia or abnormality. No abdominopelvic ascites.   Musculoskeletal: No acute or significant osseous findings. Postsurgical changes L4-L5.   IMPRESSION: *No acute abnormality of the abdomen or pelvis. *Abundant residual fecal material right side colon and  proximal transverse colon without obstruction or impaction. *Right adnexal cyst 3.2 x 2.3 cm. Recommend follow-up pelvic ultrasound in 6-12 months.     Assessment:  BONNELL PLACZEK is a 53 y.o. P51 female with vulvar irritation and biopsy of right labia 4/25 shows extramammary Paget's disease.  No underlying mass concerning for invasive vulvar  cancer. Biopsy re-reviewed at [facility] confirming diagnosis of paget's. Started topical imiquimod . Tolerating poorly and persistent disease. Mapping vulvar biopsies 03/07/2024 demonstrate extensive disease. The areas that bother her most are the right vulvar and left vulvar sites.   Right adnexal cyst 3.2 x 2.3 cm, resolved, 2 mm echogenic area uncertain etiology  Melanoma of left leg with negative groin node 5/24.  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 No adjuvant therapy.  Will continue follow up with Dermatology and team at [facility]  History of endometrial ablation, perimenopausal. BMI 31 Plan:   Problem List Items Addressed This Visit       Musculoskeletal and Integument   Extramammary Paget's disease - Primary   Other Visit Diagnoses       Counseling and coordination of care            Discussion of alternatives today including continuing aldara  at 2.5% vs trastuzumab given HER2 overexpression vs surgery with WLE of most bothersome areas vs MOH's surgery. Based on her exam and symptoms we will focus on the most bothersome sites. She is interested in proceeding with EUA and vulvar WLE. She is interested in medical oncology referral to discuss the use of trastuzumab. They understand that trastuzumab is not FDA approved for this indication but there have been case reports regarding the successful use of this therapy to treat vulvar Paget's disease. See case report below.   Surgery is scheduled for 05/09/2024  Risks were discussed in detail. These include infection,  anesthesia, bleeding, transfusion, wound separation, medical issues (blood clots, stroke, heart attack, fluid in the lungs, pneumonia, abnormal heart rhythm, death), possible exploratory surgery with larger incision, allergic reaction, injury to adjacent organs (bowel, bladder, blood vessels, nerves, ureters, uterus) which is unlikely, risk of persistent/recurrent Paget's disease.   Follow up in clinic in 4-6 weeks after surgery for reassessment.   Does the patient have a personal history of VTE?:  No   Does the patient have a family history of VTE?:  No   Does the patient have cancer?:  She has Paget's and a prior h/o Melanoma. Reviewed with Dr. Caye and Paget's not considered cancer for the purpose of VTE algorithm.   <30 minutes, or Procedure plus anesthesia time <45 minutes?:  No   Risk Points::  3 Body mass index is 32.15 kg/m. 2 Age 28-60 yrs old   1    Risk Category:  Moderate High Risk  RECOMMENDED PROPHYLAXIS:  Sequential compression devices OR compression stockings OR one preoperative dose unfractionated heparin  followed by in hospital postoperative dosing with low molecular weight heparin  or unfractionated heparin    Case Reports Gynecol Oncol . 2008 Dec;111(3):568-71. doi: [email or web address]/[email or web address]. Epub 2008 Feb 5. HER-2/neu targeting for recurrent vulvar Paget's disease A case report and literature review Ellamae Carwin 1, Dorn GORMAN Hane, Amy Meredeth Katherne Melanee Chesley [employer] PMID: [id number] DOI: [email or web address] Abstract Background: The treatment of Paget's disease of the vulva particularly for recurrences can be challenging. Overexpression of the HER-2/neu protein has been found in about 30% of vulvar Paget's cases therefore presenting a potential therapeutic target.  Case: We report the case of a 53 year old patient with persistent Paget's disease of the vulva despite eight surgical excisions over a 15-year period. Immunohistochemistry  demonstrated overexpression of the HER-2/neu protein in the vulva resection specimen. Treatment with Trastuzumab resulted in a significant regression of her disease and resolution of symptoms.  Conclusion: Based on our case  report, therapeutic targeting of HER-2/neu for patients with Paget's disease of the vulva using for example Trastuzumab is a potentially effective, alternative approach, and warrants further investigation.  The patient's diagnosis, an outline of the further diagnostic and laboratory studies which will be required, the recommendation, and alternatives were discussed.  All questions were answered to the patient's satisfaction.  Over 60 minutes were spent with the patient/family today; 50% was spent in education, counseling and coordination of care for Paget's disease.    Kadelyn Dimascio Isidor Constable, MD

## 2024-04-11 NOTE — Progress Notes (Signed)
 Met with patient to review pre-operative teaching for planned surgery scheduled for May 09, 2024. Pre-admit phone screen appointment discussed. Teaching included but not limited to labs, bowel prep and dietary restrictions, surgical location, pain scales/management, diligent peri care, guidelines to prevent constipation, and importance of early ambulation post operatively and follow-up care. Written instructions for pre-op and post-op care, contact information for questions and concerns provided to patient. Patient able to repeat instructions to nursing staff. No further questions at this time. Patient agrees with plan to proceed with scheduled procedure.

## 2024-04-19 ENCOUNTER — Encounter: Payer: Self-pay | Admitting: Obstetrics and Gynecology

## 2024-04-23 ENCOUNTER — Telehealth: Payer: Self-pay

## 2024-04-23 NOTE — Telephone Encounter (Signed)
 Received my chart message requesting to cancel 05/09/24 surgery with Dr. Elby. Reports she is seeking a second opinion. Surgery has been cancelled at her request.

## 2024-05-01 ENCOUNTER — Other Ambulatory Visit

## 2024-05-02 NOTE — Progress Notes (Signed)
 After multiple attempts we were unable to reach the patient to enroll in [facility].  The referral has been closed.

## 2024-05-09 ENCOUNTER — Ambulatory Visit: Admit: 2024-05-09 | Admitting: Obstetrics and Gynecology

## 2024-05-09 SURGERY — EXAM UNDER ANESTHESIA, PELVIC
Anesthesia: General

## 2024-06-06 ENCOUNTER — Ambulatory Visit

## 2024-08-17 ENCOUNTER — Other Ambulatory Visit: Payer: Self-pay

## 2024-08-17 DIAGNOSIS — E042 Nontoxic multinodular goiter: Secondary | ICD-10-CM

## 2024-08-22 ENCOUNTER — Ambulatory Visit: Admission: RE | Admit: 2024-08-22 | Source: Ambulatory Visit
